# Patient Record
Sex: Male | Born: 1950
Health system: Southern US, Community
[De-identification: ages and names within clinical notes are randomized; demographics above are authoritative.]

## PROBLEM LIST (undated history)

## (undated) DIAGNOSIS — K5732 Diverticulitis of large intestine without perforation or abscess without bleeding: Secondary | ICD-10-CM

## (undated) DIAGNOSIS — IMO0002 Reserved for concepts with insufficient information to code with codable children: Secondary | ICD-10-CM

## (undated) DIAGNOSIS — I10 Essential (primary) hypertension: Secondary | ICD-10-CM

## (undated) DIAGNOSIS — D126 Benign neoplasm of colon, unspecified: Secondary | ICD-10-CM

## (undated) DIAGNOSIS — E785 Hyperlipidemia, unspecified: Secondary | ICD-10-CM

## (undated) DIAGNOSIS — D649 Anemia, unspecified: Secondary | ICD-10-CM

## (undated) DIAGNOSIS — T7840XA Allergy, unspecified, initial encounter: Secondary | ICD-10-CM

## (undated) DIAGNOSIS — S22009A Unspecified fracture of unspecified thoracic vertebra, initial encounter for closed fracture: Secondary | ICD-10-CM

## (undated) DIAGNOSIS — K5730 Diverticulosis of large intestine without perforation or abscess without bleeding: Secondary | ICD-10-CM

## (undated) DIAGNOSIS — C4492 Squamous cell carcinoma of skin, unspecified: Secondary | ICD-10-CM

## (undated) DIAGNOSIS — K219 Gastro-esophageal reflux disease without esophagitis: Secondary | ICD-10-CM

## (undated) DIAGNOSIS — D508 Other iron deficiency anemias: Secondary | ICD-10-CM

## (undated) HISTORY — DX: Hyperlipidemia, unspecified: E78.5

## (undated) HISTORY — DX: Anemia, unspecified: D64.9

## (undated) HISTORY — DX: Unspecified fracture of unspecified thoracic vertebra, initial encounter for closed fracture: S22.009A

## (undated) HISTORY — DX: Allergy, unspecified, initial encounter: T78.40XA

## (undated) HISTORY — DX: Diverticulosis of large intestine without perforation or abscess without bleeding: K57.30

## (undated) HISTORY — DX: Diverticulitis of large intestine without perforation or abscess without bleeding: K57.32

## (undated) HISTORY — DX: Squamous cell carcinoma of skin, unspecified: C44.92

## (undated) HISTORY — DX: Reserved for concepts with insufficient information to code with codable children: IMO0002

## (undated) HISTORY — DX: Essential (primary) hypertension: I10

## (undated) HISTORY — PX: POLYPECTOMY: SHX149

## (undated) HISTORY — PX: WISDOM TOOTH EXTRACTION: SHX21

## (undated) HISTORY — PX: FOOT SURGERY: SHX648

## (undated) HISTORY — DX: Other iron deficiency anemias: D50.8

## (undated) HISTORY — DX: Benign neoplasm of colon, unspecified: D12.6

## (undated) HISTORY — PX: COLONOSCOPY W/ POLYPECTOMY: SHX1380

## (undated) HISTORY — DX: Gastro-esophageal reflux disease without esophagitis: K21.9

---

## 1990-10-21 DIAGNOSIS — IMO0002 Reserved for concepts with insufficient information to code with codable children: Secondary | ICD-10-CM

## 1990-10-21 HISTORY — DX: Reserved for concepts with insufficient information to code with codable children: IMO0002

## 1999-09-30 ENCOUNTER — Emergency Department (HOSPITAL_COMMUNITY): Admission: EM | Admit: 1999-09-30 | Discharge: 1999-09-30 | Payer: Self-pay | Admitting: Emergency Medicine

## 1999-09-30 ENCOUNTER — Encounter: Payer: Self-pay | Admitting: Emergency Medicine

## 2002-05-10 ENCOUNTER — Encounter: Admission: RE | Admit: 2002-05-10 | Discharge: 2002-05-10 | Payer: Self-pay | Admitting: *Deleted

## 2002-05-10 ENCOUNTER — Encounter: Payer: Self-pay | Admitting: *Deleted

## 2002-06-09 ENCOUNTER — Encounter: Admission: RE | Admit: 2002-06-09 | Discharge: 2002-06-09 | Payer: Self-pay | Admitting: *Deleted

## 2002-06-09 ENCOUNTER — Encounter: Payer: Self-pay | Admitting: *Deleted

## 2004-08-23 ENCOUNTER — Ambulatory Visit: Payer: Self-pay | Admitting: Internal Medicine

## 2004-11-14 ENCOUNTER — Encounter: Admission: RE | Admit: 2004-11-14 | Discharge: 2004-11-14 | Payer: Self-pay | Admitting: Orthopaedic Surgery

## 2004-11-28 ENCOUNTER — Encounter: Admission: RE | Admit: 2004-11-28 | Discharge: 2004-11-28 | Payer: Self-pay | Admitting: Orthopaedic Surgery

## 2005-02-18 ENCOUNTER — Ambulatory Visit: Payer: Self-pay | Admitting: Internal Medicine

## 2005-04-15 ENCOUNTER — Ambulatory Visit: Payer: Self-pay | Admitting: Internal Medicine

## 2005-10-16 ENCOUNTER — Ambulatory Visit: Payer: Self-pay | Admitting: Internal Medicine

## 2006-03-27 ENCOUNTER — Ambulatory Visit: Payer: Self-pay | Admitting: Internal Medicine

## 2006-03-28 ENCOUNTER — Encounter: Admission: RE | Admit: 2006-03-28 | Discharge: 2006-03-28 | Payer: Self-pay | Admitting: Internal Medicine

## 2006-03-29 ENCOUNTER — Encounter: Admission: RE | Admit: 2006-03-29 | Discharge: 2006-03-29 | Payer: Self-pay | Admitting: Internal Medicine

## 2006-04-08 ENCOUNTER — Ambulatory Visit: Payer: Self-pay | Admitting: Internal Medicine

## 2006-05-20 ENCOUNTER — Ambulatory Visit: Payer: Self-pay | Admitting: Internal Medicine

## 2006-06-19 ENCOUNTER — Ambulatory Visit: Payer: Self-pay | Admitting: Gastroenterology

## 2006-06-21 DIAGNOSIS — D126 Benign neoplasm of colon, unspecified: Secondary | ICD-10-CM

## 2006-06-21 HISTORY — DX: Benign neoplasm of colon, unspecified: D12.6

## 2006-06-27 ENCOUNTER — Ambulatory Visit: Payer: Self-pay | Admitting: Gastroenterology

## 2006-06-27 ENCOUNTER — Encounter: Payer: Self-pay | Admitting: Gastroenterology

## 2006-06-27 ENCOUNTER — Encounter: Payer: Self-pay | Admitting: Internal Medicine

## 2006-07-14 ENCOUNTER — Ambulatory Visit: Payer: Self-pay | Admitting: Internal Medicine

## 2006-09-04 ENCOUNTER — Ambulatory Visit: Payer: Self-pay | Admitting: Internal Medicine

## 2006-11-04 ENCOUNTER — Ambulatory Visit: Payer: Self-pay | Admitting: Internal Medicine

## 2006-11-04 LAB — CONVERTED CEMR LAB
Basophils Absolute: 0 10*3/uL (ref 0.0–0.1)
Basophils Relative: 0.8 % (ref 0.0–1.0)
HCT: 42.8 % (ref 39.0–52.0)
Iron: 77 ug/dL (ref 42–165)
Monocytes Relative: 11.2 % — ABNORMAL HIGH (ref 3.0–11.0)
Neutro Abs: 2.3 10*3/uL (ref 1.4–7.7)
Neutrophils Relative %: 39.9 % — ABNORMAL LOW (ref 43.0–77.0)

## 2006-11-10 ENCOUNTER — Ambulatory Visit: Payer: Self-pay | Admitting: Internal Medicine

## 2006-11-10 LAB — CONVERTED CEMR LAB
Albumin: 4.3 g/dL (ref 3.5–5.2)
Basophils Relative: 1.3 % — ABNORMAL HIGH (ref 0.0–1.0)
Bilirubin, Direct: 0.2 mg/dL (ref 0.0–0.3)
Cholesterol: 122 mg/dL (ref 0–200)
Eosinophils Relative: 3.6 % (ref 0.0–5.0)
HCT: 43.8 % (ref 39.0–52.0)
Hemoglobin: 15.3 g/dL (ref 13.0–17.0)
LDL Cholesterol: 69 mg/dL (ref 0–99)
Lymphocytes Relative: 42.2 % (ref 12.0–46.0)
MCHC: 34.9 g/dL (ref 30.0–36.0)
MCV: 82.5 fL (ref 78.0–100.0)
Monocytes Relative: 9.1 % (ref 3.0–11.0)
Neutro Abs: 2.6 10*3/uL (ref 1.4–7.7)
Neutrophils Relative %: 43.8 % (ref 43.0–77.0)
RBC: 5.31 M/uL (ref 4.22–5.81)
RDW: 13.3 % (ref 11.5–14.6)
Total CHOL/HDL Ratio: 3.9
Total Protein: 7.2 g/dL (ref 6.0–8.3)
Triglycerides: 111 mg/dL (ref 0–149)
VLDL: 22 mg/dL (ref 0–40)

## 2007-02-12 ENCOUNTER — Ambulatory Visit: Payer: Self-pay | Admitting: Internal Medicine

## 2007-02-12 LAB — CONVERTED CEMR LAB
Basophils Absolute: 0 10*3/uL (ref 0.0–0.1)
Basophils Relative: 0 % (ref 0.0–1.0)
Eosinophils Relative: 3.8 % (ref 0.0–5.0)
Lymphocytes Relative: 39.8 % (ref 12.0–46.0)
MCV: 86.2 fL (ref 78.0–100.0)
Monocytes Absolute: 0.7 10*3/uL (ref 0.2–0.7)
Neutro Abs: 3 10*3/uL (ref 1.4–7.7)
Platelets: 217 10*3/uL (ref 150–400)
RBC: 5.2 M/uL (ref 4.22–5.81)
RDW: 12.5 % (ref 11.5–14.6)

## 2007-05-15 DIAGNOSIS — K219 Gastro-esophageal reflux disease without esophagitis: Secondary | ICD-10-CM | POA: Insufficient documentation

## 2007-05-15 DIAGNOSIS — I10 Essential (primary) hypertension: Secondary | ICD-10-CM | POA: Insufficient documentation

## 2007-05-15 DIAGNOSIS — E785 Hyperlipidemia, unspecified: Secondary | ICD-10-CM | POA: Insufficient documentation

## 2007-05-28 ENCOUNTER — Ambulatory Visit: Payer: Self-pay | Admitting: Internal Medicine

## 2007-05-28 DIAGNOSIS — D508 Other iron deficiency anemias: Secondary | ICD-10-CM

## 2007-05-28 DIAGNOSIS — K5730 Diverticulosis of large intestine without perforation or abscess without bleeding: Secondary | ICD-10-CM | POA: Insufficient documentation

## 2007-05-28 DIAGNOSIS — K429 Umbilical hernia without obstruction or gangrene: Secondary | ICD-10-CM | POA: Insufficient documentation

## 2007-05-28 HISTORY — DX: Diverticulosis of large intestine without perforation or abscess without bleeding: K57.30

## 2007-05-28 HISTORY — DX: Other iron deficiency anemias: D50.8

## 2007-05-28 LAB — CONVERTED CEMR LAB
CO2: 29 meq/L (ref 19–32)
Calcium: 9.3 mg/dL (ref 8.4–10.5)
Chloride: 103 meq/L (ref 96–112)
Cholesterol: 134 mg/dL (ref 0–200)
Eosinophils Absolute: 0.2 10*3/uL (ref 0.0–0.6)
Eosinophils Relative: 4 % (ref 0.0–5.0)
GFR calc Af Amer: 89 mL/min
Glucose, Bld: 96 mg/dL (ref 70–99)
HCT: 42.4 % (ref 39.0–52.0)
HDL: 28.5 mg/dL — ABNORMAL LOW (ref >39.0)
Iron: 131 ug/dL (ref 42–165)
Lymphocytes Relative: 35.5 % (ref 12.0–46.0)
MCHC: 34.3 g/dL (ref 30.0–36.0)
Monocytes Relative: 9.6 % (ref 3.0–11.0)
Neutrophils Relative %: 49.9 % (ref 43.0–77.0)
RDW: 12.2 % (ref 11.5–14.6)
Sodium: 138 meq/L (ref 135–145)
Total Bilirubin: 1.9 mg/dL — ABNORMAL HIGH (ref 0.3–1.2)
Triglycerides: 120 mg/dL (ref 0–149)
VLDL: 24 mg/dL (ref 0–40)

## 2007-08-04 ENCOUNTER — Ambulatory Visit: Payer: Self-pay | Admitting: Internal Medicine

## 2007-11-30 ENCOUNTER — Ambulatory Visit: Payer: Self-pay | Admitting: Internal Medicine

## 2007-11-30 LAB — CONVERTED CEMR LAB
Cholesterol: 114 mg/dL (ref 0–200)
HDL: 27.4 mg/dL — ABNORMAL LOW (ref >39.0)
LDL Cholesterol: 73 mg/dL (ref 0–99)
Total CHOL/HDL Ratio: 4.2
VLDL: 14 mg/dL (ref 0–40)

## 2007-12-07 ENCOUNTER — Ambulatory Visit: Payer: Self-pay | Admitting: Internal Medicine

## 2007-12-07 DIAGNOSIS — Z86010 Personal history of colonic polyps: Secondary | ICD-10-CM | POA: Insufficient documentation

## 2007-12-07 DIAGNOSIS — F528 Other sexual dysfunction not due to a substance or known physiological condition: Secondary | ICD-10-CM | POA: Insufficient documentation

## 2007-12-07 LAB — CONVERTED CEMR LAB: LDL Goal: 100 mg/dL

## 2008-01-25 ENCOUNTER — Ambulatory Visit: Payer: Self-pay | Admitting: Internal Medicine

## 2008-01-25 DIAGNOSIS — M546 Pain in thoracic spine: Secondary | ICD-10-CM | POA: Insufficient documentation

## 2008-01-28 ENCOUNTER — Ambulatory Visit: Payer: Self-pay | Admitting: Internal Medicine

## 2008-03-09 ENCOUNTER — Ambulatory Visit: Payer: Self-pay | Admitting: Internal Medicine

## 2008-03-09 DIAGNOSIS — S22009A Unspecified fracture of unspecified thoracic vertebra, initial encounter for closed fracture: Secondary | ICD-10-CM | POA: Insufficient documentation

## 2008-03-09 HISTORY — DX: Unspecified fracture of unspecified thoracic vertebra, initial encounter for closed fracture: S22.009A

## 2008-03-09 LAB — CONVERTED CEMR LAB
Alpha-2-Globulin: 10.8 % (ref 7.1–11.8)
BUN: 18 mg/dL (ref 6–23)
Beta Globulin: 6.9 % (ref 4.7–7.2)
Calcium, Total (PTH): 9.3 mg/dL (ref 8.4–10.5)
Creatinine, Ser: 0.89 mg/dL (ref 0.40–1.50)
Glucose, Bld: 72 mg/dL (ref 70–99)
Potassium: 4.8 meq/L (ref 3.5–5.3)
Sodium: 140 meq/L (ref 135–145)
Total Protein, Serum Electrophoresis: 7.5 g/dL (ref 6.0–8.3)

## 2008-03-10 ENCOUNTER — Ambulatory Visit: Payer: Self-pay | Admitting: Internal Medicine

## 2008-03-10 ENCOUNTER — Encounter: Payer: Self-pay | Admitting: Internal Medicine

## 2008-06-10 ENCOUNTER — Ambulatory Visit: Payer: Self-pay | Admitting: Internal Medicine

## 2008-06-10 LAB — CONVERTED CEMR LAB
ALT: 31 units/L (ref 0–53)
AST: 25 units/L (ref 0–37)
Alkaline Phosphatase: 39 units/L (ref 39–117)
BUN: 14 mg/dL (ref 6–23)
Basophils Absolute: 0.1 10*3/uL (ref 0.0–0.1)
Basophils Relative: 0.9 % (ref 0.0–3.0)
Chloride: 105 meq/L (ref 96–112)
Cholesterol: 142 mg/dL (ref 0–200)
Eosinophils Absolute: 0.3 10*3/uL (ref 0.0–0.7)
Eosinophils Relative: 5.3 % — ABNORMAL HIGH (ref 0.0–5.0)
GFR calc Af Amer: 89 mL/min
GFR calc non Af Amer: 73 mL/min
Glucose, Bld: 89 mg/dL (ref 70–99)
Glucose, Urine, Semiquant: NEGATIVE
Hemoglobin: 15.1 g/dL (ref 13.0–17.0)
Neutro Abs: 3.2 10*3/uL (ref 1.4–7.7)
Neutrophils Relative %: 53.1 % (ref 43.0–77.0)
Platelets: 199 10*3/uL (ref 150–400)
Potassium: 4.6 meq/L (ref 3.5–5.1)
Sodium: 143 meq/L (ref 135–145)
Specific Gravity, Urine: 1.02
TSH: 2.3 microintl units/mL (ref 0.35–5.50)
Total Bilirubin: 1.3 mg/dL — ABNORMAL HIGH (ref 0.3–1.2)
Triglycerides: 96 mg/dL (ref 0–149)
WBC Urine, dipstick: NEGATIVE

## 2008-06-13 LAB — CONVERTED CEMR LAB
Basophils Absolute: 0 10*3/uL (ref 0.0–0.1)
Basophils Relative: 0.6 % (ref 0.0–1.0)
Eosinophils Absolute: 0.3 10*3/uL (ref 0.0–0.7)
HCT: 44.3 % (ref 39.0–52.0)
Hemoglobin: 14.9 g/dL (ref 13.0–17.0)
MCHC: 33.7 g/dL (ref 30.0–36.0)
MCV: 86.7 fL (ref 78.0–100.0)
Monocytes Relative: 8.9 % (ref 3.0–12.0)
Neutro Abs: 3.2 10*3/uL (ref 1.4–7.7)
Neutrophils Relative %: 50.8 % (ref 43.0–77.0)
RDW: 12.7 % (ref 11.5–14.6)

## 2008-06-17 ENCOUNTER — Ambulatory Visit: Payer: Self-pay | Admitting: Internal Medicine

## 2008-06-17 DIAGNOSIS — J309 Allergic rhinitis, unspecified: Secondary | ICD-10-CM | POA: Insufficient documentation

## 2008-07-21 ENCOUNTER — Ambulatory Visit: Payer: Self-pay | Admitting: Internal Medicine

## 2008-12-09 ENCOUNTER — Ambulatory Visit: Payer: Self-pay | Admitting: Internal Medicine

## 2008-12-09 LAB — CONVERTED CEMR LAB
ALT: 36 units/L (ref 0–53)
AST: 29 units/L (ref 0–37)
Albumin: 4.2 g/dL (ref 3.5–5.2)
Alkaline Phosphatase: 39 units/L (ref 39–117)
BUN: 21 mg/dL (ref 6–23)
Cholesterol: 137 mg/dL (ref 0–200)
Creatinine, Ser: 1.3 mg/dL (ref 0.4–1.5)
Glucose, Bld: 94 mg/dL (ref 70–99)
VLDL: 23 mg/dL (ref 0–40)

## 2008-12-26 ENCOUNTER — Ambulatory Visit: Payer: Self-pay | Admitting: Internal Medicine

## 2009-06-01 ENCOUNTER — Ambulatory Visit: Payer: Self-pay | Admitting: Internal Medicine

## 2009-06-01 LAB — CONVERTED CEMR LAB
ALT: 55 units/L — ABNORMAL HIGH (ref 0–53)
Alkaline Phosphatase: 30 units/L — ABNORMAL LOW (ref 39–117)
Bilirubin, Direct: 0.1 mg/dL (ref 0.0–0.3)
HDL: 32.4 mg/dL — ABNORMAL LOW (ref >39.00)
LDL Cholesterol: 84 mg/dL (ref 0–99)
Total Bilirubin: 1.4 mg/dL — ABNORMAL HIGH (ref 0.3–1.2)

## 2009-06-14 ENCOUNTER — Ambulatory Visit: Payer: Self-pay | Admitting: Internal Medicine

## 2009-07-18 ENCOUNTER — Ambulatory Visit: Payer: Self-pay | Admitting: Internal Medicine

## 2009-12-04 ENCOUNTER — Ambulatory Visit: Payer: Self-pay | Admitting: Internal Medicine

## 2009-12-04 LAB — CONVERTED CEMR LAB
ALT: 63 units/L — ABNORMAL HIGH (ref 0–53)
AST: 45 units/L — ABNORMAL HIGH (ref 0–37)
Alkaline Phosphatase: 37 units/L — ABNORMAL LOW (ref 39–117)
Basophils Relative: 0.6 % (ref 0.0–3.0)
Bilirubin Urine: NEGATIVE
Bilirubin, Direct: 0.1 mg/dL (ref 0.0–0.3)
Blood in Urine, dipstick: NEGATIVE
Chloride: 107 meq/L (ref 96–112)
Cholesterol: 171 mg/dL (ref 0–200)
Creatinine, Ser: 1.4 mg/dL (ref 0.4–1.5)
Eosinophils Absolute: 0.2 10*3/uL (ref 0.0–0.7)
GFR calc non Af Amer: 55.07 mL/min (ref >60)
Glucose, Bld: 102 mg/dL — ABNORMAL HIGH (ref 70–99)
Glucose, Urine, Semiquant: NEGATIVE
HDL: 41.4 mg/dL (ref >39.00)
LDL Cholesterol: 96 mg/dL (ref 0–99)
MCHC: 33.3 g/dL (ref 30.0–36.0)
Monocytes Absolute: 0.6 10*3/uL (ref 0.1–1.0)
Monocytes Relative: 8.2 % (ref 3.0–12.0)
Neutro Abs: 4.2 10*3/uL (ref 1.4–7.7)
Nitrite: NEGATIVE
PSA: 1.74 ng/mL (ref 0.10–4.00)
Sodium: 144 meq/L (ref 135–145)
Specific Gravity, Urine: 1.02
TSH: 1.77 microintl units/mL (ref 0.35–5.50)
Total Protein: 7.5 g/dL (ref 6.0–8.3)
Urobilinogen, UA: 1

## 2009-12-11 ENCOUNTER — Ambulatory Visit: Payer: Self-pay | Admitting: Internal Medicine

## 2010-03-15 ENCOUNTER — Ambulatory Visit: Payer: Self-pay | Admitting: Internal Medicine

## 2010-03-15 DIAGNOSIS — E162 Hypoglycemia, unspecified: Secondary | ICD-10-CM | POA: Insufficient documentation

## 2010-03-15 LAB — CONVERTED CEMR LAB
BUN: 22 mg/dL (ref 6–23)
CO2: 28 meq/L (ref 19–32)
Calcium: 9.9 mg/dL (ref 8.4–10.5)
Creatinine, Ser: 1.2 mg/dL (ref 0.4–1.5)
GFR calc non Af Amer: 69.04 mL/min (ref >60)
Hgb A1c MFr Bld: 5.4 % (ref 4.6–6.5)
Sodium: 140 meq/L (ref 135–145)

## 2010-03-27 ENCOUNTER — Telehealth: Payer: Self-pay | Admitting: Internal Medicine

## 2010-07-16 ENCOUNTER — Ambulatory Visit: Payer: Self-pay | Admitting: Internal Medicine

## 2010-11-22 NOTE — Assessment & Plan Note (Signed)
Summary: CPX//SLM   Vital Signs:  Patient profile:   60 year old male Height:      61 inches Weight:      225 pounds BMI:     42.67 Temp:     98.2 degrees F oral Pulse rate:   64 / minute Resp:     14 per minute BP sitting:   140 / 84  (left arm)  Vitals Entered By: Willy Eddy, LPN (December 11, 2009 8:59 AM) CC: cpx Is Patient Diabetic? No   CC:  cpx.  History of Present Illness: The pt was asked about all immunizations, health maint. services that are appropriate to their age and was given guidance on diet exercize  and weight management Went ot the [place] and they have suggested changes in his medications which are to low to change safely we discssued appropriate switches and will send him back to the [place]   Preventive Screening-Counseling & Management  Alcohol-Tobacco     Smoking Status: quit  Problems Prior to Update: 1)  Allergic Rhinitis  (ICD-477.9) 2)  Compression Fracture, Thoracic Vertebra  (ICD-805.2) 3)  Back Pain, Thoracic Region, Left  (ICD-724.1) 4)  Erectile Dysfunction  (ICD-302.72) 5)  Colonic Polyps, Hx of  (ICD-V12.72) 6)  Diverticulosis, Colon  (ICD-562.10) 7)  Preventive Health Care  (ICD-V70.0) 8)  Umbilical Hernia, Hx of  (ICD-V13.8) 9)  Family History Breast Cancer 1st Degree Relative <50  (ICD-V16.3) 10)  Anemia Due To Dietary Iron Deficiency  (ICD-280.1) 11)  Hypertension  (ICD-401.9) 12)  Hyperlipidemia  (ICD-272.4) 13)  Gerd  (ICD-530.81)  Current Problems (verified): 1)  Allergic Rhinitis  (ICD-477.9) 2)  Compression Fracture, Thoracic Vertebra  (ICD-805.2) 3)  Back Pain, Thoracic Region, Left  (ICD-724.1) 4)  Erectile Dysfunction  (ICD-302.72) 5)  Colonic Polyps, Hx of  (ICD-V12.72) 6)  Diverticulosis, Colon  (ICD-562.10) 7)  Preventive Health Care  (ICD-V70.0) 8)  Umbilical Hernia, Hx of  (ICD-V13.8) 9)  Family History Breast Cancer 1st Degree Relative <50  (ICD-V16.3) 10)  Anemia Due To Dietary Iron Deficiency   (ICD-280.1) 11)  Hypertension  (ICD-401.9) 12)  Hyperlipidemia  (ICD-272.4) 13)  Gerd  (ICD-530.81)  Medications Prior to Update: 1)  Lipitor 10 Mg Tabs (Atorvastatin Calcium) .... One By Mouth Daily 2)  Benicar 40 Mg Tabs (Olmesartan Medoxomil) .... One By Mouth Daily 3)  Omeprazole 40 Mg Cpdr (Omeprazole) .... One By Mouth Daily 4)  Transderm-Scop 1.5 Mg Pt72 (Scopolamine Base) .... As Needed 5)  Multivitamins   Caps (Multiple Vitamin) .... Once Daily 6)  Cialis 5 Mg Tabs (Tadalafil) .... One By Mouth Daily 7)  Fish Oil Concentrate 1000 Mg Caps (Omega-3 Fatty Acids) .... Two Caps By Mouth Two Times A Day 8)  Folic Acid 400 Mcg Tabs (Folic Acid) .[name].. 1 Once Daily  Current Medications (verified): 1)  Pravastatin Sodium 40 Mg Tabs (Pravastatin Sodium) .... One By Mouth Daily 2)  Lisinopril 40 Mg Tabs (Lisinopril) .... One By Mouth Daily 3)  Omeprazole 40 Mg Cpdr (Omeprazole) .... One By Mouth Daily 4)  Multivitamins   Caps (Multiple Vitamin) .... Once Daily 5)  Cialis 5 Mg Tabs (Tadalafil) .... One By Mouth Daily 6)  Fish Oil Concentrate 1000 Mg Caps (Omega-3 Fatty Acids) .... Two Caps By Mouth Two Times A Day 7)  Folic Acid 400 Mcg Tabs (Folic Acid) .[name].. 1 Once Daily  Allergies (verified): No Known Drug Allergies  Past History:  Family History: Last updated: 05/28/2007 brother with hemochomatosis  Family History Lung cancer  father Mother Family History Breast cancer 1st degree relative <50  Social History: Last updated: 05/28/2007 Married Former Smoker Regular exercise-yes  Risk Factors: Exercise: yes (05/28/2007)  Risk Factors: Smoking Status: quit (12/11/2009)  Past medical, surgical, family and social histories (including risk factors) reviewed, and no changes noted (except as noted below).  Past Medical History: Reviewed history from 06/17/2008 and no changes required. GERD Hyperlipidemia Hypertension anemia Diverticulosis, colon Colonic polyps, hx  of Allergic rhinitis  Past Surgical History: Reviewed history from 12/07/2007 and no changes required. foot surgery Colon polypectomy  Family History: Reviewed history from 05/28/2007 and no changes required. brother with hemochomatosis Family History Lung cancer  father Mother Family History Breast cancer 1st degree relative <50  Social History: Reviewed history from 05/28/2007 and no changes required. Married Former Smoker Regular exercise-yes  Physical Exam  General:  Well-developed,well-nourished,in no acute distress; alert,appropriate and cooperative throughout examination Head:  normocephalic and atraumatic.   Eyes:  pupils equal, pupils round, and pupils reactive to light.   Ears:  R ear normal and L ear normal.   Nose:  no nasal discharge.   Mouth:  Oral mucosa and oropharynx without lesions or exudates.  Teeth in good repair. Neck:  No deformities, masses, or tenderness noted. Chest Wall:  no deformities and no tenderness.   Lungs:  Normal respiratory effort, chest expands symmetrically. Lungs are clear to auscultation, no crackles or wheezes. Heart:  Normal rate and regular rhythm. S1 and S2 normal without gallop, murmur, click, rub or other extra sounds. Abdomen:  soft, non-tender, and no distention.   Rectal:  normal sphincter tone and external hemorrhoid(s).   Genitalia:  circumcised and no urethral discharge.   Prostate:  no gland enlargement and no nodules.   Msk:  No deformity or scoliosis noted of thoracic or lumbar spine.   Pulses:  R and L carotid,radial,femoral,dorsalis pedis and posterior tibial pulses are full and equal bilaterally Extremities:  No clubbing, cyanosis, edema, or deformity noted with normal full range of motion of all joints.   Neurologic:  No cranial nerve deficits noted. Station and gait are normal. Plantar reflexes are down-going bilaterally. DTRs are symmetrical throughout. Sensory, motor and coordinative functions appear  intact.   Impression & Recommendations:  Problem # 1:  PREVENTIVE HEALTH CARE (ICD-V70.0) The pt was asked about all immunizations, health maint. services that are appropriate to their age and was given guidance on diet exercize  and weight management  Colonoscopy: Results: Polyp.  Results: Diverticulosis.        (06/27/2006) Td Booster: Tdap (12/11/2009)   Flu Vax: Fluvax 3+ (07/18/2009)   Chol: 171 (12/04/2009)   HDL: 41.40 (12/04/2009)   LDL: 96 (12/04/2009)   TG: 170.0 (12/04/2009) TSH: 1.77 (12/04/2009)   PSA: 1.74 (12/04/2009) Next Colonoscopy due:: 06/2011 (12/07/2007)  Discussed using sunscreen, use of alcohol, drug use, self testicular exam, routine dental care, routine eye care, routine physical exam, seat belts, multiple vitamins, osteoporosis prevention, adequate calcium intake in diet, and recommendations for immunizations.  Discussed exercise and checking cholesterol.  Discussed gun safety, safe sex, and contraception. Also recommend checking PSA.  Problem # 2:  HYPERLIPIDEMIA (ICD-272.4) med changes for [facility] His updated medication list for this problem includes:    Pravastatin Sodium 40 Mg Tabs (Pravastatin sodium) ..... One by mouth daily  Complete Medication List: 1)  Pravastatin Sodium 40 Mg Tabs (Pravastatin sodium) .... One by mouth daily 2)  Lisinopril 40 Mg Tabs (Lisinopril) .Marland KitchenMarland KitchenMarland Kitchen  One by mouth daily 3)  Omeprazole 40 Mg Cpdr (Omeprazole) .... One by mouth daily 4)  Multivitamins Caps (Multiple vitamin) .... Once daily 5)  Cialis 5 Mg Tabs (Tadalafil) .... One by mouth daily 6)  Fish Oil Concentrate 1000 Mg Caps (Omega-3 fatty acids) .... Two caps by mouth two times a day 7)  Folic Acid 400 Mcg Tabs (Folic acid) .[name].. 1 once daily  Other Orders: Tdap => 89yrs IM (04540) Admin 1st Vaccine (98119) EKG w/ Interpretation (93000)  Patient Instructions: 1)  When you go back to the [place] the amoiunt of lisinopril was insuficinet and you need 40 mg  ( to egual to  Benicar) 2)  you have reguired 40 mg of prilosec to control your GERD 3)  the dose of pravastatin equal in effectivelness to lipitor is 40 mg 4)  if they are will to make these changes then we can moniter the effect in 3 months 5)  Please schedule a follow-up appointment in 3 months 6)  BMP prior to visit, ICD-9: 401.9 7)  Hepatic Panel prior to visit, ICD-9:995.20 8)  Lipid Panel prior to visit, ICD-9:272.4   Immunizations Administered:  Tetanus Vaccine:    Vaccine Type: Tdap    Site: right deltoid    Mfr: boostrix    Dose: 0.5 ml    Route: IM    Given by: Willy Eddy, LPN    Exp. Date: 12/16/2011    Lot #: [id number]

## 2010-11-22 NOTE — Progress Notes (Signed)
Summary: please advise  Phone Note Call from Patient Call back at Home Phone [phone] Call back at Work Phone [phone]   Caller: Spouse----live call Summary of Call: was recently put on Lisinopril 40mg  1qd. having chest tightness and difficulty breathing. Please advise. pt did take his pill today. Initial call taken by: Warnell Forester,  March 27, 2010 12:18 PM  Follow-up for Phone Call        Wants to change back to Benicar. [facility]  [place] Follow-up by: Lynann Beaver CMA,  March 27, 2010 12:54 PM  Additional Follow-up for Phone Call Additional follow up Details #1::        per dr Lovell Sheehan- may change back to benicar 40 1 once daily but we cant guarantee insurance will pay Additional Follow-up by: Willy Eddy, LPN,  March 28, 2955 1:48 PM   New Allergies: ! ACE INHIBITORS New/Updated Medications: BENICAR 40 MG TABS (OLMESARTAN MEDOXOMIL) one by mouth daily New Allergies: ! ACE INHIBITORSPrescriptions: BENICAR 40 MG TABS (OLMESARTAN MEDOXOMIL) one by mouth daily  #90 x 1   Entered by:   Lynann Beaver CMA   Authorized by:   Stacie Glaze MD   Signed by:   Lynann Beaver CMA on 03/27/2010   Method used:   Electronically to        [facility]. [phone]* (retail)       [address]       [place], [place]  [address]       Ph: [phone]       Fax: [phone]   RxID:   [id number]

## 2010-11-22 NOTE — Assessment & Plan Note (Signed)
Summary: 3 MONTH ROV/NJR   Vital Signs:  Patient profile:   60 year old male Height:      61 inches Weight:      228 pounds BMI:     43.24 Temp:     98.2 degrees F oral Pulse rate:   64 / minute Pulse rhythm:   regular Resp:     14 per minute BP sitting:   140 / 84  (left arm)  Vitals Entered By: Willy Eddy, LPN (Mar 15, 2010 9:23 AM) CC: roa, Hypertension Management   CC:  roa and Hypertension Management.  History of Present Illness: The pt was changes from benicar to lisenotrip and notes some hacking cough The pt has increased sinus congestion and allergy symptoms using allegra and has dizzyness stopped and this went away has been feeling "run down"   Hypertension History:      He denies headache, chest pain, palpitations, dyspnea with exertion, orthopnea, PND, peripheral edema, visual symptoms, neurologic problems, syncope, and side effects from treatment.        Positive major cardiovascular risk factors include male age 74 years old or older, hyperlipidemia, and hypertension.  Negative major cardiovascular risk factors include non-tobacco-user status.        Positive history for target organ damage include ASHD (either angina/prior MI/prior CABG).     Preventive Screening-Counseling & Management  Alcohol-Tobacco     Smoking Status: quit  Problems Prior to Update: 1)  Allergic Rhinitis  (ICD-477.9) 2)  Compression Fracture, Thoracic Vertebra  (ICD-805.2) 3)  Back Pain, Thoracic Region, Left  (ICD-724.1) 4)  Erectile Dysfunction  (ICD-302.72) 5)  Colonic Polyps, Hx of  (ICD-V12.72) 6)  Diverticulosis, Colon  (ICD-562.10) 7)  Preventive Health Care  (ICD-V70.0) 8)  Umbilical Hernia, Hx of  (ICD-V13.8) 9)  Family History Breast Cancer 1st Degree Relative <50  (ICD-V16.3) 10)  Anemia Due To Dietary Iron Deficiency  (ICD-280.1) 11)  Hypertension  (ICD-401.9) 12)  Hyperlipidemia  (ICD-272.4) 13)  Gerd  (ICD-530.81)  Current Problems (verified): 1)   Allergic Rhinitis  (ICD-477.9) 2)  Compression Fracture, Thoracic Vertebra  (ICD-805.2) 3)  Back Pain, Thoracic Region, Left  (ICD-724.1) 4)  Erectile Dysfunction  (ICD-302.72) 5)  Colonic Polyps, Hx of  (ICD-V12.72) 6)  Diverticulosis, Colon  (ICD-562.10) 7)  Preventive Health Care  (ICD-V70.0) 8)  Umbilical Hernia, Hx of  (ICD-V13.8) 9)  Family History Breast Cancer 1st Degree Relative <50  (ICD-V16.3) 10)  Anemia Due To Dietary Iron Deficiency  (ICD-280.1) 11)  Hypertension  (ICD-401.9) 12)  Hyperlipidemia  (ICD-272.4) 13)  Gerd  (ICD-530.81)  Medications Prior to Update: 1)  Pravastatin Sodium 40 Mg Tabs (Pravastatin Sodium) .... One By Mouth Daily 2)  Lisinopril 40 Mg Tabs (Lisinopril) .... One By Mouth Daily 3)  Omeprazole 40 Mg Cpdr (Omeprazole) .... One By Mouth Daily 4)  Multivitamins   Caps (Multiple Vitamin) .... Once Daily 5)  Cialis 5 Mg Tabs (Tadalafil) .... One By Mouth Daily 6)  Fish Oil Concentrate 1000 Mg Caps (Omega-3 Fatty Acids) .... Two Caps By Mouth Two Times A Day 7)  Folic Acid 400 Mcg Tabs (Folic Acid) .[name].. 1 Once Daily  Current Medications (verified): 1)  Lipitor 20 Mg Tabs (Atorvastatin Calcium) .[name].. 1 Once Daily 2)  Lisinopril 40 Mg Tabs (Lisinopril) .... One By Mouth Daily 3)  Omeprazole 40 Mg Cpdr (Omeprazole) .... One By Mouth Daily 4)  Multivitamins   Caps (Multiple Vitamin) .... Once Daily 5)  Cialis 5 Mg  Tabs (Tadalafil) .... One By Mouth Daily 6)  Fish Oil Concentrate 1000 Mg Caps (Omega-3 Fatty Acids) .... Two Caps By Mouth Two Times A Day 7)  Folic Acid 400 Mcg Tabs (Folic Acid) .[name].. 1 Once Daily  Allergies (verified): No Known Drug Allergies  Past History:  Family History: Last updated: 05/28/2007 brother with hemochomatosis Family History Lung cancer  father Mother Family History Breast cancer 1st degree relative <50  Social History: Last updated: 05/28/2007 Married Former Smoker Regular exercise-yes  Risk Factors: Exercise:  yes (05/28/2007)  Risk Factors: Smoking Status: quit (03/15/2010)  Past medical, surgical, family and social histories (including risk factors) reviewed, and no changes noted (except as noted below).  Past Medical History: Reviewed history from 06/17/2008 and no changes required. GERD Hyperlipidemia Hypertension anemia Diverticulosis, colon Colonic polyps, hx of Allergic rhinitis  Past Surgical History: Reviewed history from 12/07/2007 and no changes required. foot surgery Colon polypectomy  Family History: Reviewed history from 05/28/2007 and no changes required. brother with hemochomatosis Family History Lung cancer  father Mother Family History Breast cancer 1st degree relative <50  Social History: Reviewed history from 05/28/2007 and no changes required. Married Former Smoker Regular exercise-yes  Review of Systems  The patient denies anorexia, fever, weight loss, weight gain, vision loss, decreased hearing, hoarseness, chest pain, syncope, dyspnea on exertion, peripheral edema, prolonged cough, headaches, hemoptysis, abdominal pain, melena, hematochezia, severe indigestion/heartburn, hematuria, incontinence, genital sores, muscle weakness, suspicious skin lesions, transient blindness, difficulty walking, depression, unusual weight change, abnormal bleeding, enlarged lymph nodes, angioedema, and breast masses.    Physical Exam  General:  Well-developed,well-nourished,in no acute distress; alert,appropriate and cooperative throughout examination Head:  normocephalic and atraumatic.   Eyes:  pupils equal, pupils round, and pupils reactive to light.   Ears:  R ear normal and L ear normal.   Nose:  no nasal discharge.   Mouth:  Oral mucosa and oropharynx without lesions or exudates.  Teeth in good repair. Neck:  No deformities, masses, or tenderness noted. Lungs:  Normal respiratory effort, chest expands symmetrically. Lungs are clear to auscultation, no crackles or  wheezes. Heart:  Normal rate and regular rhythm. S1 and S2 normal without gallop, murmur, click, rub or other extra sounds. Abdomen:  soft, non-tender, and no distention.   Msk:  No deformity or scoliosis noted of thoracic or lumbar spine.   Pulses:  R and L carotid,radial,femoral,dorsalis pedis and posterior tibial pulses are full and equal bilaterally Extremities:  No clubbing, cyanosis, edema, or deformity noted with normal full range of motion of all joints.   Neurologic:  No cranial nerve deficits noted. Station and gait are normal. Plantar reflexes are down-going bilaterally. DTRs are symmetrical throughout. Sensory, motor and coordinative functions appear intact.   Impression & Recommendations:  Problem # 1:  HYPERLIPIDEMIA (ICD-272.4)  His updated medication list for this problem includes:    Lipitor 20 Mg Tabs (Atorvastatin calcium) .[name]... 1 once daily  Labs Reviewed: SGOT: 45 (12/04/2009)   SGPT: 63 (12/04/2009)  Lipid Goals: Chol Goal: 200 (12/07/2007)   HDL Goal: 40 (12/07/2007)   LDL Goal: 100 (12/07/2007)   TG Goal: 150 (12/07/2007)  Prior 10 Yr Risk Heart Disease: N/A (12/07/2007)   HDL:41.40 (12/04/2009), 32.40 (06/01/2009)  LDL:96 (12/04/2009), 84 (06/01/2009)  Chol:171 (12/04/2009), 152 (06/01/2009)  Trig:170.0 (12/04/2009), 180.0 (06/01/2009)  Problem # 2:  HYPERTENSION (ICD-401.9)  His updated medication list for this problem includes:    Lisinopril 40 Mg Tabs (Lisinopril) ..... One by mouth daily  BP today: 140/84 Prior BP: 140/84 (12/11/2009)  Prior 10 Yr Risk Heart Disease: N/A (12/07/2007)  Labs Reviewed: K+: 4.5 (12/04/2009) Creat: : 1.4 (12/04/2009)   Chol: 171 (12/04/2009)   HDL: 41.40 (12/04/2009)   LDL: 96 (12/04/2009)   TG: 170.0 (12/04/2009)  Problem # 3:  GERD (ICD-530.81)  His updated medication list for this problem includes:    Omeprazole 40 Mg Cpdr (Omeprazole) ..... One by mouth daily  Labs Reviewed: Hgb: 15.4 (12/04/2009)   Hct: 46.1  (12/04/2009)  Problem # 4:  REACTIVE HYPOGLYCEMIA (ICD-251.2)  wqeight loss and carbohydrate modificatons  Orders: Venipuncture (16109) TLB-BMP (Basic Metabolic Panel-BMET) (80048-METABOL) TLB-A1C / Hgb A1C (Glycohemoglobin) (83036-A1C)  Complete Medication List: 1)  Lipitor 20 Mg Tabs (Atorvastatin calcium) .[name].. 1 once daily 2)  Lisinopril 40 Mg Tabs (Lisinopril) .... One by mouth daily 3)  Omeprazole 40 Mg Cpdr (Omeprazole) .... One by mouth daily 4)  Multivitamins Caps (Multiple vitamin) .... Once daily 5)  Cialis 5 Mg Tabs (Tadalafil) .... One by mouth daily 6)  Fish Oil Concentrate 1000 Mg Caps (Omega-3 fatty acids) .... Two caps by mouth two times a day 7)  Folic Acid 400 Mcg Tabs (Folic acid) .[name].. 1 once daily  Hypertension Assessment/Plan:      The patient's hypertensive risk group is category C: Target organ damage and/or diabetes.  Today's blood pressure is 140/84.  His blood pressure goal is < 140/90.  Patient Instructions: 1)  to prevent hte low blood sugars modify diet to limit sugars and carbs    need to cut back on carb and increase protein 2)  wathc out for the sweet drinks with sugar 3)  Please schedule a follow-up appointment in 3 months.

## 2010-11-22 NOTE — Assessment & Plan Note (Signed)
Summary: ROA/FUP/RCD---PTS WIFE RSC (BMP) // RS wife rsc appt/njr   Vital Signs:  Patient profile:   60 year old male Height:      71 inches Weight:      220 pounds BMI:     30.79 Temp:     98.2 degrees F oral Pulse rate:   68 / minute Pulse rhythm:   regular Resp:     14 per minute BP sitting:   120 / 70  (left arm)  Vitals Entered By: Willy Eddy, LPN (July 16, 2010 2:12 PM)  Nutrition Counseling: Patient's BMI is greater than 25 and therefore counseled on weight management options. CC: roa, Hypertension Management, Lipid Management Is Patient Diabetic? No   Primary Care Provider:  Stacie Glaze MD  CC:  roa, Hypertension Management, and Lipid Management.  History of Present Illness: weigth loss of 10 lbs with  walking on a regular basis gerd improved with weight loss   Hypertension History:      He denies headache, chest pain, palpitations, dyspnea with exertion, orthopnea, PND, peripheral edema, visual symptoms, neurologic problems, syncope, and side effects from treatment.        Positive major cardiovascular risk factors include male age 33 years old or older, hyperlipidemia, and hypertension.  Negative major cardiovascular risk factors include non-tobacco-user status.        Positive history for target organ damage include ASHD (either angina/prior MI/prior CABG).    Lipid Management History:      Positive NCEP/ATP III risk factors include male age 39 years old or older, hypertension, and ASHD (either angina/prior MI/prior CABG).  Negative NCEP/ATP III risk factors include non-tobacco-user status.      Preventive Screening-Counseling & Management  Alcohol-Tobacco     Smoking Status: quit     Year Quit: 1982     Tobacco Counseling: to remain off tobacco products  Problems Prior to Update: 1)  Reactive Hypoglycemia  (ICD-251.2) 2)  Allergic Rhinitis  (ICD-477.9) 3)  Compression Fracture, Thoracic Vertebra  (ICD-805.2) 4)  Back Pain, Thoracic  Region, Left  (ICD-724.1) 5)  Erectile Dysfunction  (ICD-302.72) 6)  Colonic Polyps, Hx of  (ICD-V12.72) 7)  Diverticulosis, Colon  (ICD-562.10) 8)  Preventive Health Care  (ICD-V70.0) 9)  Umbilical Hernia, Hx of  (ICD-V13.8) 10)  Family History Breast Cancer 1st Degree Relative <50  (ICD-V16.3) 11)  Anemia Due To Dietary Iron Deficiency  (ICD-280.1) 12)  Hypertension  (ICD-401.9) 13)  Hyperlipidemia  (ICD-272.4) 14)  Gerd  (ICD-530.81)  Current Problems (verified): 1)  Reactive Hypoglycemia  (ICD-251.2) 2)  Allergic Rhinitis  (ICD-477.9) 3)  Compression Fracture, Thoracic Vertebra  (ICD-805.2) 4)  Back Pain, Thoracic Region, Left  (ICD-724.1) 5)  Erectile Dysfunction  (ICD-302.72) 6)  Colonic Polyps, Hx of  (ICD-V12.72) 7)  Diverticulosis, Colon  (ICD-562.10) 8)  Preventive Health Care  (ICD-V70.0) 9)  Umbilical Hernia, Hx of  (ICD-V13.8) 10)  Family History Breast Cancer 1st Degree Relative <50  (ICD-V16.3) 11)  Anemia Due To Dietary Iron Deficiency  (ICD-280.1) 12)  Hypertension  (ICD-401.9) 13)  Hyperlipidemia  (ICD-272.4) 14)  Gerd  (ICD-530.81)  Medications Prior to Update: 1)  Lipitor 20 Mg Tabs (Atorvastatin Calcium) .[name].. 1 Once Daily 2)  Omeprazole 40 Mg Cpdr (Omeprazole) .... One By Mouth Daily 3)  Multivitamins   Caps (Multiple Vitamin) .... Once Daily 4)  Cialis 5 Mg Tabs (Tadalafil) .... One By Mouth Daily 5)  Fish Oil Concentrate 1000 Mg Caps (Omega-3 Fatty Acids) .... Two  Caps By Mouth Two Times A Day 6)  Folic Acid 400 Mcg Tabs (Folic Acid) .[name].. 1 Once Daily 7)  Lodrane 24d 12-90 Mg Xr24h-Cap (Brompheniramine-Pseudoeph) .[name].. 1 Once Daily 8)  Benicar 40 Mg Tabs (Olmesartan Medoxomil) .... One By Mouth Daily 9)  Transderm-Scop 1.5 Mg Pt72 (Scopolamine Base) .[name].. 1 Box and Use  Asd Directed  Current Medications (verified): 1)  Lipitor 20 Mg Tabs (Atorvastatin Calcium) .[name].. 1 Once Daily 2)  Omeprazole 40 Mg Cpdr (Omeprazole) .... One By Mouth Daily 3)   Multivitamins   Caps (Multiple Vitamin) .... Once Daily 4)  Cialis 5 Mg Tabs (Tadalafil) .... One By Mouth Daily 5)  Fish Oil Concentrate 1000 Mg Caps (Omega-3 Fatty Acids) .... Two Caps By Mouth Two Times A Day 6)  Folic Acid 400 Mcg Tabs (Folic Acid) .[name].. 1 Once Daily 7)  Lodrane 24d 12-90 Mg Xr24h-Cap (Brompheniramine-Pseudoeph) .[name].. 1 Once Daily 8)  Benicar 40 Mg Tabs (Olmesartan Medoxomil) .... One By Mouth Daily 9)  Transderm-Scop 1.5 Mg Pt72 (Scopolamine Base) .[name].. 1 Box and Use  Asd Directed 10)  Aspirin 81 Mg Tbec (Aspirin) .[name].. 1 Once Daily  Allergies (verified): 1)  ! Ace Inhibitors  Past History:  Family History: Last updated: 05/28/2007 brother with hemochomatosis Family History Lung cancer  father Mother Family History Breast cancer 1st degree relative <50  Social History: Last updated: 05/28/2007 Married Former Smoker Regular exercise-yes  Risk Factors: Exercise: yes (05/28/2007)  Risk Factors: Smoking Status: quit (07/16/2010)  Past medical, surgical, family and social histories (including risk factors) reviewed, and no changes noted (except as noted below).  Past Medical History: Reviewed history from 06/17/2008 and no changes required. GERD Hyperlipidemia Hypertension anemia Diverticulosis, colon Colonic polyps, hx of Allergic rhinitis  Past Surgical History: Reviewed history from 12/07/2007 and no changes required. foot surgery Colon polypectomy  Family History: Reviewed history from 05/28/2007 and no changes required. brother with hemochomatosis Family History Lung cancer  father Mother Family History Breast cancer 1st degree relative <50  Social History: Reviewed history from 05/28/2007 and no changes required. Married Former Smoker Regular exercise-yes  Review of Systems       Flu Vaccine Consent Questions     Do you have a history of severe allergic reactions to this vaccine? no    Any prior history of allergic reactions to egg  and/or gelatin? no    Do you have a sensitivity to the preservative Thimersol? no    Do you have a past history of Guillan-Barre Syndrome? no    Do you currently have an acute febrile illness? no    Have you ever had a severe reaction to latex? no    Vaccine information given and explained to patient? yes    Are you currently pregnant? no    Lot Number:[id number]   Exp Date:04/20/2011   Site Given  Left Deltoid IM   Physical Exam  General:  Well-developed,well-nourished,in no acute distress; alert,appropriate and cooperative throughout examination Head:  normocephalic and atraumatic.   Eyes:  pupils equal, pupils round, and pupils reactive to light.   Ears:  R ear normal and L ear normal.   Nose:  no nasal discharge.   Mouth:  Oral mucosa and oropharynx without lesions or exudates.  Teeth in good repair. Neck:  No deformities, masses, or tenderness noted. Lungs:  Normal respiratory effort, chest expands symmetrically. Lungs are clear to auscultation, no crackles or wheezes. Heart:  Normal rate and regular rhythm. S1 and S2 normal  without gallop, murmur, click, rub or other extra sounds. Abdomen:  soft, non-tender, and no distention.   Msk:  No deformity or scoliosis noted of thoracic or lumbar spine.   Pulses:  R and L carotid,radial,femoral,dorsalis pedis and posterior tibial pulses are full and equal bilaterally Extremities:  No clubbing, cyanosis, edema, or deformity noted with normal full range of motion of all joints.   Neurologic:  No cranial nerve deficits noted. Station and gait are normal. Plantar reflexes are down-going bilaterally. DTRs are symmetrical throughout. Sensory, motor and coordinative functions appear intact.   Impression & Recommendations:  Problem # 1:  HYPERTENSION (ICD-401.9) Assessment Unchanged  His updated medication list for this problem includes:    Benicar 40 Mg Tabs (Olmesartan medoxomil) ..... One by mouth daily  BP today: 120/70 Prior BP:  140/84 (03/15/2010)  Prior 10 Yr Risk Heart Disease: N/A (12/07/2007)  Labs Reviewed: K+: 5.3 (03/15/2010) Creat: : 1.2 (03/15/2010)   Chol: 171 (12/04/2009)   HDL: 41.40 (12/04/2009)   LDL: 96 (12/04/2009)   TG: 170.0 (12/04/2009)  Problem # 2:  HYPERLIPIDEMIA (ICD-272.4) Assessment: Unchanged  His updated medication list for this problem includes:    Lipitor 20 Mg Tabs (Atorvastatin calcium) .[name]... 1 once daily  Labs Reviewed: SGOT: 45 (12/04/2009)   SGPT: 63 (12/04/2009)  Lipid Goals: Chol Goal: 200 (12/07/2007)   HDL Goal: 40 (12/07/2007)   LDL Goal: 100 (12/07/2007)   TG Goal: 150 (12/07/2007)  Prior 10 Yr Risk Heart Disease: N/A (12/07/2007)   HDL:41.40 (12/04/2009), 32.40 (06/01/2009)  LDL:96 (12/04/2009), 84 (06/01/2009)  Chol:171 (12/04/2009), 152 (06/01/2009)  Trig:170.0 (12/04/2009), 180.0 (06/01/2009)  Problem # 3:  GERD (ICD-530.81) Assessment: Unchanged  His updated medication list for this problem includes:    Omeprazole 40 Mg Cpdr (Omeprazole) ..... One by mouth daily  Labs Reviewed: Hgb: 15.4 (12/04/2009)   Hct: 46.1 (12/04/2009)  Complete Medication List: 1)  Lipitor 20 Mg Tabs (Atorvastatin calcium) .[name].. 1 once daily 2)  Omeprazole 40 Mg Cpdr (Omeprazole) .... One by mouth daily 3)  Multivitamins Caps (Multiple vitamin) .... Once daily 4)  Cialis 5 Mg Tabs (Tadalafil) .... One by mouth daily 5)  Fish Oil Concentrate 1000 Mg Caps (Omega-3 fatty acids) .... Two caps by mouth two times a day 6)  Folic Acid 400 Mcg Tabs (Folic acid) .[name].. 1 once daily 7)  Lodrane 24d 12-90 Mg Xr24h-cap (Brompheniramine-pseudoeph) .[name].. 1 once daily 8)  Benicar 40 Mg Tabs (Olmesartan medoxomil) .... One by mouth daily 9)  Transderm-scop 1.5 Mg Pt72 (Scopolamine base) .[name].. 1 box and use  asd directed 10)  Aspirin 81 Mg Tbec (Aspirin) .[name].. 1 once daily  Other Orders: Admin 1st Vaccine (46270) Flu Vaccine 54yrs + (35009)  Hypertension Assessment/Plan:      The patient's  hypertensive risk group is category C: Target organ damage and/or diabetes.  Today's blood pressure is 120/70.  His blood pressure goal is < 140/90.  Lipid Assessment/Plan:      Based on NCEP/ATP III, the patient's risk factor category is "history of coronary disease, peripheral vascular disease, cerebrovascular disease, or aortic aneurysm".  The patient's lipid goals are as follows: Total cholesterol goal is 200; LDL cholesterol goal is 100; HDL cholesterol goal is 40; Triglyceride goal is 150.  His LDL cholesterol goal has been met.    Patient Instructions: 1)  Please schedule a follow-up appointment in 6 months.CPX Prescriptions: LIPITOR 20 MG TABS (ATORVASTATIN CALCIUM) 1 once daily  #30 x 11   Entered and  Authorized by:   Stacie Glaze MD   Signed by:   Stacie Glaze MD on 07/16/2010   Method used:   Electronically to        [facility]. [phone]* (retail)       [phone]       [place], [place]  [address]       Ph: [phone]       Fax: [phone]   RxID:   [id number]   Appended Document: Orders Update     Clinical Lists Changes  Orders: Added new Service order of Est. Patient Level IV (57322) - Signed

## 2010-12-03 ENCOUNTER — Other Ambulatory Visit: Payer: Self-pay | Admitting: Dermatology

## 2010-12-19 ENCOUNTER — Telehealth: Payer: Self-pay | Admitting: Internal Medicine

## 2010-12-19 NOTE — Telephone Encounter (Signed)
Triage vm------had a fever, night before last. The fever broke this morning, but it is coming back with headches. Tried Advil and Tylenol for fever. Please advise.        [facility]

## 2010-12-19 NOTE — Telephone Encounter (Signed)
deb

## 2010-12-19 NOTE — Telephone Encounter (Signed)
Pt has fever, but does not know how high.  Complains of vomiting and headache.   Discussed with Dr. Rodena Medin and pt to be seen tomorrow.

## 2010-12-20 ENCOUNTER — Inpatient Hospital Stay (HOSPITAL_COMMUNITY)
Admission: EM | Admit: 2010-12-20 | Discharge: 2010-12-22 | DRG: 194 | Disposition: A | Discharge status: Home / Self Care | Payer: 59 | Attending: Internal Medicine | Admitting: Internal Medicine

## 2010-12-20 ENCOUNTER — Ambulatory Visit (INDEPENDENT_AMBULATORY_CARE_PROVIDER_SITE_OTHER): Payer: 59 | Admitting: Internal Medicine

## 2010-12-20 ENCOUNTER — Emergency Department (HOSPITAL_COMMUNITY): Payer: 59

## 2010-12-20 ENCOUNTER — Encounter: Payer: Self-pay | Admitting: Internal Medicine

## 2010-12-20 DIAGNOSIS — N529 Male erectile dysfunction, unspecified: Secondary | ICD-10-CM | POA: Diagnosis present

## 2010-12-20 DIAGNOSIS — R112 Nausea with vomiting, unspecified: Secondary | ICD-10-CM

## 2010-12-20 DIAGNOSIS — K219 Gastro-esophageal reflux disease without esophagitis: Secondary | ICD-10-CM | POA: Diagnosis present

## 2010-12-20 DIAGNOSIS — E785 Hyperlipidemia, unspecified: Secondary | ICD-10-CM | POA: Diagnosis present

## 2010-12-20 DIAGNOSIS — R509 Fever, unspecified: Secondary | ICD-10-CM

## 2010-12-20 DIAGNOSIS — D649 Anemia, unspecified: Secondary | ICD-10-CM | POA: Diagnosis present

## 2010-12-20 DIAGNOSIS — I10 Essential (primary) hypertension: Secondary | ICD-10-CM | POA: Diagnosis present

## 2010-12-20 DIAGNOSIS — J154 Pneumonia due to other streptococci: Principal | ICD-10-CM | POA: Diagnosis present

## 2010-12-20 DIAGNOSIS — E871 Hypo-osmolality and hyponatremia: Secondary | ICD-10-CM | POA: Diagnosis present

## 2010-12-20 DIAGNOSIS — D72829 Elevated white blood cell count, unspecified: Secondary | ICD-10-CM | POA: Diagnosis present

## 2010-12-20 LAB — CBC WITH DIFFERENTIAL/PLATELET
Basophils Absolute: 0 10*3/uL (ref 0.0–0.1)
Eosinophils Absolute: 0 10*3/uL (ref 0.0–0.7)
HCT: 37.3 % — ABNORMAL LOW (ref 39.0–52.0)
Hemoglobin: 12.8 g/dL — ABNORMAL LOW (ref 13.0–17.0)
Lymphocytes Relative: 4.6 % — ABNORMAL LOW (ref 12.0–46.0)
Monocytes Relative: 6.1 % (ref 3.0–12.0)
Platelets: 197 10*3/uL (ref 150.0–400.0)
RDW: 13.8 % (ref 11.5–14.6)

## 2010-12-20 LAB — BASIC METABOLIC PANEL
BUN: 24 mg/dL — ABNORMAL HIGH (ref 6–23)
CO2: 24 mEq/L (ref 19–32)
Calcium: 9.1 mg/dL (ref 8.4–10.5)
Creatinine, Ser: 1.2 mg/dL (ref 0.4–1.5)
GFR: 68.86 mL/min (ref >60.00)
Potassium: 4.4 mEq/L (ref 3.5–5.1)

## 2010-12-20 LAB — HEPATIC FUNCTION PANEL
ALT: 18 U/L (ref 0–53)
Albumin: 3.6 g/dL (ref 3.5–5.2)

## 2010-12-20 MED ORDER — PROMETHAZINE HCL 50 MG PO TABS: 25.0000 mg | ORAL_TABLET | Freq: Four times a day (QID) | ORAL | Status: DC | PRN

## 2010-12-20 NOTE — Assessment & Plan Note (Signed)
Obtain CBC, chem7, LFT. Take tylenol/advil alternating for fever/pain prn. Liquid diet and advance to bland solid diet as tolerated. Phenergan prn n/v and cautioned re: possible sedating effect. Suspect possible gastroenteritis but followup closely if no improvement or worsening.

## 2010-12-20 NOTE — Progress Notes (Signed)
  Subjective:    Patient ID: Larry Williamson, male    DOB: 10/06/1951, 60 y.o.   MRN: [id number]  HPI Pt presents to clinic for evaluation of fever and N/V. Notes sx's began 3 days again with N/V without abdominal pain or diarrhea. Developed fever and chills with Tmax 102.5 yesterday. C/o frontal HA primarily with NP cough but has no photophobia or neck stiffness. No known sick exposure. Emesis has stopped as of yesterday but nausea continues. No alleviating or exacerbating factors. Taking tylenol and advil for fever and pain. Denies other complaints.  Reviewed PMH, medications and allergies.    Review of Systems See HPI    Objective:   Physical Exam  Constitutional: He appears well-developed and well-nourished. No distress.  HENT:  Head: Normocephalic and atraumatic.  Right Ear: Tympanic membrane, external ear and ear canal normal.  Left Ear: Tympanic membrane, external ear and ear canal normal.  Nose: Nose normal.  Mouth/Throat: Oropharynx is clear and moist. No oropharyngeal exudate.  Eyes: Conjunctivae are normal. Pupils are equal, round, and reactive to light. Right eye exhibits no discharge. Left eye exhibits no discharge. No scleral icterus.  Neck: Normal range of motion. Neck supple.  Cardiovascular: Regular rhythm and normal heart sounds.  Tachycardia present.  Exam reveals no gallop and no friction rub.   No murmur heard. Pulmonary/Chest: Effort normal and breath sounds normal. No respiratory distress. He has no wheezes. He has no rales.  Abdominal: Soft. Bowel sounds are normal. He exhibits no distension. There is no hepatosplenomegaly. There is generalized tenderness. There is no rigidity and no guarding. A hernia is present.       Umbilical hernia without erythema or tenderness. +reducible.  Lymphadenopathy:    He has no cervical adenopathy.  Neurological: He is alert. Gait normal.  Skin: Skin is warm and dry. No rash noted. He is not diaphoretic. No cyanosis. No  pallor.          Assessment & Plan:

## 2010-12-21 LAB — BASIC METABOLIC PANEL
BUN: 20 mg/dL (ref 6–23)
CO2: 22 mEq/L (ref 19–32)
Calcium: 8.3 mg/dL — ABNORMAL LOW (ref 8.4–10.5)
Creatinine, Ser: 1.01 mg/dL (ref 0.4–1.5)
GFR calc non Af Amer: >60 mL/min (ref >60)
Potassium: 3.8 mEq/L (ref 3.5–5.1)

## 2010-12-21 LAB — CBC
HCT: 31.2 % — ABNORMAL LOW (ref 39.0–52.0)
MCV: 81.3 fL (ref 78.0–100.0)
Platelets: 188 10*3/uL (ref 150–400)
RDW: 13 % (ref 11.5–15.5)
WBC: 14.7 10*3/uL — ABNORMAL HIGH (ref 4.0–10.5)

## 2010-12-21 LAB — DIFFERENTIAL
Basophils Absolute: 0 10*3/uL (ref 0.0–0.1)
Lymphocytes Relative: 10 % — ABNORMAL LOW (ref 12–46)
Monocytes Absolute: 2 10*3/uL — ABNORMAL HIGH (ref 0.1–1.0)
Monocytes Relative: 14 % — ABNORMAL HIGH (ref 3–12)
Neutro Abs: 11.1 10*3/uL — ABNORMAL HIGH (ref 1.7–7.7)
Neutrophils Relative %: 76 % (ref 43–77)

## 2010-12-21 LAB — IRON AND TIBC

## 2010-12-21 LAB — VITAMIN B12: Vitamin B-12: 257 pg/mL (ref 211–911)

## 2010-12-21 LAB — FERRITIN: Ferritin: 207 ng/mL (ref 22–322)

## 2010-12-22 LAB — CBC
HCT: 33 % — ABNORMAL LOW (ref 39.0–52.0)
Hemoglobin: 11.6 g/dL — ABNORMAL LOW (ref 13.0–17.0)
MCV: 81.3 fL (ref 78.0–100.0)
RBC: 4.06 MIL/uL — ABNORMAL LOW (ref 4.22–5.81)
RDW: 13 % (ref 11.5–15.5)
WBC: 11.2 10*3/uL — ABNORMAL HIGH (ref 4.0–10.5)

## 2010-12-22 LAB — BASIC METABOLIC PANEL
BUN: 15 mg/dL (ref 6–23)
CO2: 24 mEq/L (ref 19–32)
Chloride: 103 mEq/L (ref 96–112)
Glucose, Bld: 109 mg/dL — ABNORMAL HIGH (ref 70–99)
Potassium: 3.7 mEq/L (ref 3.5–5.1)
Sodium: 137 mEq/L (ref 135–145)

## 2010-12-22 LAB — LIPID PANEL
HDL: 22 mg/dL — ABNORMAL LOW (ref >39)
LDL Cholesterol: 62 mg/dL (ref 0–99)
Triglycerides: 86 mg/dL (ref <150)
VLDL: 17 mg/dL (ref 0–40)

## 2010-12-23 LAB — LEGIONELLA ANTIGEN, URINE

## 2010-12-24 ENCOUNTER — Ambulatory Visit (INDEPENDENT_AMBULATORY_CARE_PROVIDER_SITE_OTHER): Payer: 59 | Admitting: Internal Medicine

## 2010-12-24 ENCOUNTER — Telehealth: Payer: Self-pay | Admitting: Internal Medicine

## 2010-12-24 ENCOUNTER — Encounter: Payer: Self-pay | Admitting: Internal Medicine

## 2010-12-24 DIAGNOSIS — R7401 Elevation of levels of liver transaminase levels: Secondary | ICD-10-CM

## 2010-12-24 DIAGNOSIS — D72829 Elevated white blood cell count, unspecified: Secondary | ICD-10-CM

## 2010-12-24 DIAGNOSIS — R748 Abnormal levels of other serum enzymes: Secondary | ICD-10-CM

## 2010-12-24 DIAGNOSIS — J189 Pneumonia, unspecified organism: Secondary | ICD-10-CM

## 2010-12-24 DIAGNOSIS — R195 Other fecal abnormalities: Secondary | ICD-10-CM

## 2010-12-24 DIAGNOSIS — R740 Nonspecific elevation of levels of transaminase and lactic acid dehydrogenase [LDH]: Secondary | ICD-10-CM

## 2010-12-24 LAB — CBC WITH DIFFERENTIAL/PLATELET
Basophils Absolute: 0 10*3/uL (ref 0.0–0.1)
Eosinophils Relative: 2.5 % (ref 0.0–5.0)
HCT: 32.6 % — ABNORMAL LOW (ref 39.0–52.0)
Hemoglobin: 11.4 g/dL — ABNORMAL LOW (ref 13.0–17.0)
Lymphs Abs: 1.7 10*3/uL (ref 0.7–4.0)
MCV: 86.1 fl (ref 78.0–100.0)
Monocytes Absolute: 1.6 10*3/uL — ABNORMAL HIGH (ref 0.1–1.0)
Monocytes Relative: 11.5 % (ref 3.0–12.0)
Neutro Abs: 10 10*3/uL — ABNORMAL HIGH (ref 1.4–7.7)
Platelets: 307 10*3/uL (ref 150.0–400.0)
RDW: 13.7 % (ref 11.5–14.6)

## 2010-12-24 LAB — HEPATIC FUNCTION PANEL
ALT: 57 U/L — ABNORMAL HIGH (ref 0–53)
AST: 42 U/L — ABNORMAL HIGH (ref 0–37)
Albumin: 2.7 g/dL — ABNORMAL LOW (ref 3.5–5.2)
Total Bilirubin: 1 mg/dL (ref 0.3–1.2)

## 2010-12-24 MED ORDER — CEFTRIAXONE SODIUM 1 G IJ SOLR: 1000.0000 mg | INTRAMUSCULAR | Status: DC

## 2010-12-24 MED ORDER — CEFTRIAXONE SODIUM 1 G IJ SOLR
1000.0000 mg | Freq: Once | INTRAMUSCULAR | Status: AC
  Administered 2010-12-24: 1000 mg via INTRAMUSCULAR

## 2010-12-24 NOTE — Telephone Encounter (Signed)
Triage vm------went to hosp and was released Saturday night with pneumonia in the left lung. Temp--100.7. Wants Debby or Dr Rodena Medin to return his call.

## 2010-12-24 NOTE — Telephone Encounter (Signed)
pls check with him

## 2010-12-24 NOTE — Telephone Encounter (Signed)
Remains with sporadic fever, and constant , non productive cough.  Will come in today to see Dr. Rodena Medin.

## 2010-12-25 ENCOUNTER — Ambulatory Visit (INDEPENDENT_AMBULATORY_CARE_PROVIDER_SITE_OTHER): Payer: 59 | Admitting: Internal Medicine

## 2010-12-25 DIAGNOSIS — J189 Pneumonia, unspecified organism: Secondary | ICD-10-CM

## 2010-12-25 LAB — METHYLMALONIC ACID, SERUM: Methylmalonic Acid, Quantitative: 144 nmol/L (ref 87–318)

## 2010-12-25 MED ORDER — CEFTRIAXONE SODIUM 1 G IJ SOLR
1000.0000 mg | INTRAMUSCULAR | Status: DC
  Administered 2010-12-25: 1000 mg via INTRAMUSCULAR

## 2010-12-26 ENCOUNTER — Telehealth: Payer: Self-pay

## 2010-12-26 ENCOUNTER — Ambulatory Visit (INDEPENDENT_AMBULATORY_CARE_PROVIDER_SITE_OTHER): Payer: 59 | Admitting: Internal Medicine

## 2010-12-26 DIAGNOSIS — J189 Pneumonia, unspecified organism: Secondary | ICD-10-CM

## 2010-12-26 MED ORDER — CEFTRIAXONE SODIUM 1 G IJ SOLR
1000.0000 mg | Freq: Once | INTRAMUSCULAR | Status: AC
  Administered 2010-12-26: 1000 mg via INTRAMUSCULAR

## 2010-12-26 NOTE — Telephone Encounter (Signed)
Message copied by Kyung Rudd on Wed Dec 26, 2010 10:39 AM ------      Message from: Letitia Libra, Maisie Fus      Created: Tue Dec 25, 2010  9:21 PM       WBC remains mildly elevated. Liver tests only slightly elevated. Hold off on second chol medication as discussed in clinic

## 2010-12-26 NOTE — Telephone Encounter (Signed)
Pt aware. Pt will call offc or go to ED if sx don't improve over the next 2-3 days

## 2010-12-28 ENCOUNTER — Other Ambulatory Visit: Payer: 59 | Admitting: Internal Medicine

## 2010-12-28 DIAGNOSIS — Z0000 Encounter for general adult medical examination without abnormal findings: Secondary | ICD-10-CM

## 2010-12-28 LAB — PSA: PSA: 2.15 ng/mL (ref 0.10–4.00)

## 2010-12-28 LAB — CBC WITH DIFFERENTIAL/PLATELET
Basophils Relative: 0.7 % (ref 0.0–3.0)
Eosinophils Relative: 3.5 % (ref 0.0–5.0)
Hemoglobin: 11.6 g/dL — ABNORMAL LOW (ref 13.0–17.0)
Lymphocytes Relative: 22.9 % (ref 12.0–46.0)
Monocytes Relative: 5.9 % (ref 3.0–12.0)
Neutro Abs: 5.6 10*3/uL (ref 1.4–7.7)
Neutrophils Relative %: 67 % (ref 43.0–77.0)
RBC: 3.96 Mil/uL — ABNORMAL LOW (ref 4.22–5.81)
WBC: 8.3 10*3/uL (ref 4.5–10.5)

## 2010-12-28 LAB — POCT URINALYSIS DIPSTICK
Bilirubin, UA: NEGATIVE
Glucose, UA: NEGATIVE
Leukocytes, UA: NEGATIVE
Nitrite, UA: NEGATIVE
pH, UA: 5

## 2010-12-28 LAB — HEPATIC FUNCTION PANEL
ALT: 140 U/L — ABNORMAL HIGH (ref 0–53)
AST: 73 U/L — ABNORMAL HIGH (ref 0–37)
Albumin: 2.9 g/dL — ABNORMAL LOW (ref 3.5–5.2)
Alkaline Phosphatase: 60 U/L (ref 39–117)
Bilirubin, Direct: 0.1 mg/dL (ref 0.0–0.3)
Total Protein: 6.8 g/dL (ref 6.0–8.3)

## 2010-12-28 LAB — LIPID PANEL
HDL: 20.1 mg/dL — ABNORMAL LOW (ref >39.00)
Total CHOL/HDL Ratio: 4

## 2010-12-28 LAB — BASIC METABOLIC PANEL
CO2: 26 mEq/L (ref 19–32)
Calcium: 8.7 mg/dL (ref 8.4–10.5)
Creatinine, Ser: 1 mg/dL (ref 0.4–1.5)
Sodium: 143 mEq/L (ref 135–145)

## 2010-12-28 NOTE — H&P (Signed)
Larry Williamson, Larry Williamson               ACCOUNT NO.:  [id number]  MEDICAL RECORD NO.:  [id number]           PATIENT TYPE:  E  LOCATION:  [facility]                         FACILITY:  [facility]  PHYSICIAN:  Mariea Stable, MD   DATE OF BIRTH:  [birth year]  DATE OF ADMISSION:  12/20/2010 DATE OF DISCHARGE:                             HISTORY & PHYSICAL   PRIMARY CARE PHYSICIAN:  Stacie Glaze, MD  CHIEF COMPLAINT:  Cough, nausea, vomiting, and fever.  HISTORY OF PRESENT ILLNESS:  Larry Williamson is a 60 year old gentleman without any significant past medical history who presents to the emergency department upon referral from his primary care physician for leukocytosis.  The patient reports that for the last 4 days he has been having a dry cough associated with nausea, vomiting, and fevers with a T- max at home of 102.7 yesterday.  He states that today he has had multiple episodes of fevers as high as 102.  He states that over this period of time, he has not gotten any better.  He has had some nausea and vomiting and unable to keep anything down for the last 3 days, although this has improved some today.  He has also had some associated shortness of breath during this time frame.  He denies any chest pain or abdominal pain, diarrhea, hematemesis, hematochezia, melena, and/or dysuria.  The patient apparently went to his primary care physician with these symptoms and had some blood work done and was noted to have marked leukocytosis and was advised to go to the emergency department for further evaluation and treatment.  PAST MEDICAL HISTORY: 1. Hypertension. 2. Hyperlipidemia. 3. History of right foot surgery secondary to trauma while working in     his house many years ago.  MEDICATIONS: 1. Lipitor 20 mg p.o. daily. 2. Benicar 40 mg p.o. daily. 3. Omeprazole 40 mg p.o. daily. 4. Cialis 5 mg p.o. p.r.n. 5. Multivitamin daily. 6. Fish oil. 7. Folic acid. 8. Osteo Bi-Flex.  ALLERGIES:   No known drug allergies.  SOCIAL HISTORY:  The patient is married.  Wife's name is Efton Thomley, phone number is [phone].  The patient has a history of tobacco use but quit 30 years ago.  He also drank alcohol socially but has quit that as of 6 years ago.  He denies any illicit drug use ever.  FAMILY HISTORY:  Noncontributory.  REVIEW OF SYSTEMS:  As per HPI.  All others reviewed and are negative.  PHYSICAL EXAMINATION:  VITAL SIGNS:  Temperature 101.9, blood pressure 120/75, heart rate of 97, respirations 16, and oxygen saturation 97% on room air. GENERAL:  This is an older man who appears quite healthy, lying in bed, undergoing breathing treatment right now, in no acute distress. HEENT:  Normocephalic and atraumatic.  Pupils equally round and reactive to light and accommodation.  Extraocular movements are intact.  Sclerae are anicteric.  Mucous membranes are moist.  There are no oropharyngeal lesions. NECK:  Supple.  There is no JVD.  There are no carotid bruits. LUNGS:  There is good air movement bilaterally, though there are some mild crackles noted in the mid  lung field.  Otherwise clear. HEART:  There is normal S1 and S2 with a regular rate and rhythm.  There are no murmurs, gallops, or rubs. ABDOMEN:  Positive bowel sounds.  Soft, nontender, and nondistended with no organomegaly.  No rebound or guarding. EXTREMITIES:  There is no cyanosis, clubbing, or edema. NEUROLOGIC:  The patient is awake, alert, and oriented x3.  Cranial nerves are intact.  Strength is intact.  Sensation is intact.  LABORATORY DATA:  WBCs 19.9 with an ANC of 17.7, hemoglobin of 12.8 with an MCV of 87, and platelets 197.  Sodium 133, potassium 4.4, chloride 98, bicarb 24, glucose 98, BUN 24, and creatinine 1.2.  Total bilirubin 2.7, alkaline phosphatase 49, AST of 19, ALT of 18, total protein of 6.8, and albumin 3.6.  IMAGING:  Chest x-ray, impression; extensive left upper  lobe consolidation.  ASSESSMENT AND PLAN: 1. Community-acquired pneumonia.  The patient's symptoms along with     leukocytosis, fever, and chest x-ray findings are all consistent     with community-acquired pneumonia.  Given the patient's dyspnea     along with persistent nausea and vomiting, we will go ahead and     admit to regular floor for IV antibiotics with Rocephin and     Zithromax.  We will go ahead and check sputum cultures if he is     able to produce any sputum since his cough has been dry thus far.     We will also check a urine strep antigen and urine Legionella     antigen.  Once the patient is tolerating orals, we can go ahead and     switch to an oral regimen for community-acquired pneumonia.  Of     note, the patient did have his flu vaccine.  He does not recall     having had a pneumonia vaccine as he does not necessarily have any     risk factors and he is under the age of 60.  This can be considered     at some point. 2. Hyponatremia.  This is very mild and likely secondary to decreased     oral intake plus or minus some minimal syndrome of inappropriate     antidiuretic hormone given his pneumonia.  We will just go ahead     and hydrate with normal saline and follow up in the morning. 3. Anemia.  This is very mild with a hemoglobin of 12.8 and MCV of 87     making it a normocytic anemia.  It is unclear of etiology, but at     this point, we will defer for further outpatient workup unless     there is a significant change or drop in hemoglobin. 4. Leukocytosis.  As per #1. 5. Hyperbilirubinemia.  Assumed this is likely secondary to National     disease as all his other LFTs are within normal limits.  Doubt that     this is secondary to hemolytic process, but we will go ahead and     check an LDH and haptoglobin given his mild anemia.  All other LFTs     are within normal limits. 6. Hypertension.  We will go ahead and continue with his home     medications. 7.  Hyperlipidemia.  We will go ahead and continue his home     medications.  CODE STATUS:  The patient is a full code.  Next of kin would be his wife, Aadan Chenier, phone number is  [phone].     Mariea Stable, MD     MA/MEDQ  D:  12/20/2010  T:  12/20/2010  Job:  [id number]  cc:   Stacie Glaze, MD  Electronically Signed by Mariea Stable MD on 12/27/2010 10:04:14 AM

## 2010-12-29 ENCOUNTER — Encounter: Payer: Self-pay | Admitting: Internal Medicine

## 2010-12-29 DIAGNOSIS — J189 Pneumonia, unspecified organism: Secondary | ICD-10-CM | POA: Insufficient documentation

## 2010-12-29 DIAGNOSIS — R748 Abnormal levels of other serum enzymes: Secondary | ICD-10-CM | POA: Insufficient documentation

## 2010-12-29 DIAGNOSIS — R195 Other fecal abnormalities: Secondary | ICD-10-CM | POA: Insufficient documentation

## 2010-12-29 NOTE — Assessment & Plan Note (Signed)
Clinically improved but complicated by return of fever. Begin rocephin one gm IM qd x 3days. Repeat CBC. Has close followup with pmd scheduled as well.

## 2010-12-29 NOTE — Assessment & Plan Note (Signed)
Obtain CBC. Given hemoccult cards x 3 to complete.

## 2010-12-29 NOTE — Assessment & Plan Note (Signed)
Hold new chol medication pending repeat LFT and PMD approval.

## 2010-12-29 NOTE — Progress Notes (Signed)
  Subjective:    Patient ID: Larry Williamson, male    DOB: 07-20-1951, 60 y.o.   MRN: [id number]  HPI Pt presents to clinic for hospital followup of pneumonia. Recently seen with fever, minimal cough and N/V. Advised to present to ED after leukocystosis of ~20k noted on CBC and was admitted. CXR demonstrated pulmonary infiltrate and pt was placed on IV abx followed by transition to po avelox upon discharge. Pt notes significant improvement while receiving iv abx however has had return of fevers upon dc and change to po abx. Reviewed elevated bilirubin prior to admission and pt states was prescribed additional chol medication during hospitalization. Also notes recent dark stools without abdominal pain, hematemesis or hematochezia.  Reviewed pmh, medications, and allegies.    Review of Systems  Constitutional: Positive for fever and fatigue. Negative for chills.  Respiratory: Positive for cough. Negative for shortness of breath.   Gastrointestinal: Negative for vomiting, abdominal pain, blood in stool and anal bleeding.  Skin: Negative for pallor and rash.       Objective:   Physical Exam  Nursing note and vitals reviewed. Constitutional: He appears well-developed and well-nourished. No distress.  HENT:  Head: Normocephalic and atraumatic.  Right Ear: External ear normal.  Left Ear: External ear normal.  Nose: Nose normal.  Eyes: Conjunctivae are normal. Right eye exhibits no discharge. Left eye exhibits no discharge. No scleral icterus.  Neck: Neck supple.  Cardiovascular: Normal rate, regular rhythm and normal heart sounds.  Exam reveals no gallop and no friction rub.   No murmur heard. Pulmonary/Chest: Effort normal and breath sounds normal. No respiratory distress. He has no wheezes. He has no rales.  Lymphadenopathy:    He has no cervical adenopathy.  Neurological: He is alert.  Skin: Skin is warm and dry. He is not diaphoretic.          Assessment & Plan:

## 2011-01-04 ENCOUNTER — Ambulatory Visit (INDEPENDENT_AMBULATORY_CARE_PROVIDER_SITE_OTHER)
Admission: RE | Admit: 2011-01-04 | Discharge: 2011-01-04 | Disposition: A | Discharge status: Home / Self Care | Payer: 59 | Source: Ambulatory Visit | Attending: Internal Medicine | Admitting: Internal Medicine

## 2011-01-04 ENCOUNTER — Ambulatory Visit (INDEPENDENT_AMBULATORY_CARE_PROVIDER_SITE_OTHER): Payer: 59 | Admitting: Internal Medicine

## 2011-01-04 ENCOUNTER — Encounter: Payer: Self-pay | Admitting: Internal Medicine

## 2011-01-04 VITALS — BP 126/82 | HR 68 | Temp 98.2°F | Resp 14 | Ht 70.0 in | Wt 199.0 lb

## 2011-01-04 DIAGNOSIS — Z0000 Encounter for general adult medical examination without abnormal findings: Secondary | ICD-10-CM

## 2011-01-04 DIAGNOSIS — R748 Abnormal levels of other serum enzymes: Secondary | ICD-10-CM

## 2011-01-04 DIAGNOSIS — R7401 Elevation of levels of liver transaminase levels: Secondary | ICD-10-CM

## 2011-01-04 DIAGNOSIS — J189 Pneumonia, unspecified organism: Secondary | ICD-10-CM

## 2011-01-04 DIAGNOSIS — R740 Nonspecific elevation of levels of transaminase and lactic acid dehydrogenase [LDH]: Secondary | ICD-10-CM

## 2011-01-04 LAB — HEPATIC FUNCTION PANEL
ALT: 49 U/L (ref 0–53)
Total Bilirubin: 0.7 mg/dL (ref 0.3–1.2)
Total Protein: 7.5 g/dL (ref 6.0–8.3)

## 2011-01-04 MED ORDER — IOHEXOL 300 MG/ML  SOLN: 80.0000 mL | Freq: Once | INTRAMUSCULAR | Status: AC | PRN

## 2011-01-04 NOTE — Assessment & Plan Note (Signed)
Has been treated for pneumonia ( hospitalized and treated OP with rocephin and avelox) still has conjestion and worsening LFTs  he still has persistent night sweats he states he wakes up sweating almost every evening his white cell count has normalized at 8.3 he has an anemia that developed along with his infection and his platelets have increased to 412 he states that he has chest congestion but no real cough certainly no productive cough he's been taking Mucinex but this does not seem to help his sense of chest congestion.  Concerned that there may be a more ominous cause for her symptomatology it may be neoplastic pneumonia that was difficult to treat I am hopeful that this does not represent some sort of post obstructive pneumonia we will proceed with a CT scan of the chest and upper abdomen to look at the liver repeat liver functions today to make sure they're not continuing to worsen we'll follow him up on a very close basis.

## 2011-01-04 NOTE — Progress Notes (Signed)
Subjective:    Patient ID: Larry Williamson, male    DOB: May 02, 1951, 60 y.o.   MRN: [id number]  HPI  patient is a 60 year old white male with A. History of recent pneumonia presents for his yearly physical examination.  He was hospitalized briefly for a pneumonia it was felt to be lobar in nature according to chest x-rays of elevated white count of approximately 20 he was treated with IV antibiotics and these were continued outpatient with Rocephin injections he was also treated for 8 additional days without a lot of foreign milligrams by mouth daily he states that the fevers have resolved and his white count has normalized to 8 however he continues to have night sweats elevated liver functions and a sensation of chest congestion with cough this may represent resolving pneumonia but is concerned because of his recent weight loss prior to this infection and the elevations of liver enzymes   Review of Systems  Constitutional: Positive for unexpected weight change. Negative for fever and fatigue.        No jaundice he appears pale however significant weight loss  HENT: Negative for hearing loss, congestion, neck pain and postnasal drip.   Eyes: Negative for discharge, redness and visual disturbance.  Respiratory: Positive for cough and chest tightness. Negative for shortness of breath and wheezing.   Cardiovascular: Negative for leg swelling.  Gastrointestinal: Negative for abdominal pain, constipation and abdominal distention.  Genitourinary: Negative for urgency and frequency.  Musculoskeletal: Negative for joint swelling and arthralgias.  Skin: Negative for color change and rash.  Neurological: Negative for weakness and light-headedness.  Hematological: Negative for adenopathy.  Psychiatric/Behavioral: Negative for behavioral problems.   Past Medical History  Diagnosis Date  . GERD (gastroesophageal reflux disease)   . Hypertension   . Anemia   . Hyperlipidemia   . Diverticulitis  of colon   . ALLERGIC RHINITIS   . History of colonic polyps    Past Surgical History  Procedure Date  . Foot surgery   . Colonoscopy w/ polypectomy     reports that he quit smoking about 30 years ago. His smoking use included Cigarettes. He has a 15 pack-year smoking history. He has quit using smokeless tobacco. He reports that he does not drink alcohol or use illicit drugs. family history includes Cancer in his father; Dementia in his mother; and Hemochromatosis in his brother. Allergies  Allergen Reactions  . Ace Inhibitors     REACTION: cough       Objective:   Physical Exam  Nursing note and vitals reviewed. Constitutional: He is oriented to person, place, and time. He appears well-developed and well-nourished.  HENT:  Head: Atraumatic.  Right Ear: External ear normal.  Left Ear: External ear normal.  Eyes: Conjunctivae are normal. Pupils are equal, round, and reactive to light. No scleral icterus.  Neck: Normal range of motion.  Cardiovascular: Normal rate and regular rhythm.   Pulmonary/Chest: Effort normal and breath sounds normal.  Abdominal: Soft. Bowel sounds are normal.  Genitourinary:        Significant new external hemorrhoids past A. Was +1 smooth nontender  Musculoskeletal: Normal range of motion.  Lymphadenopathy:    He has no cervical adenopathy.    He has no axillary adenopathy.  Neurological: He is alert and oriented to person, place, and time.          Assessment & Plan:   in terms of his complete physical examination he is stable for all health maintenance protocol his  cholesterol to goal but much lower and middle his blood pressures stable he is up-to-date with all reasonable health maintenance guidelines including immunizations I more concerned today over this pneumonia syndrome that he had the persistent shortness of breath chest tightness an elevation of liver functions therefore we will obtain repeat liver functions today and schedule him for CT  scan of his chest as soon as possible with followup in 2 weeks' time

## 2011-01-04 NOTE — Patient Instructions (Signed)
You should be called by my office in the next 2 days to schedule CT scan of the chest will liver functions today if the liver functions remain elevated there may be more tests that will be required.

## 2011-01-04 NOTE — Assessment & Plan Note (Signed)
The abnormal liver enzymes noted when he first presented with this infection have worsened after completion of antibiotics this could be an antibiotic side effect this could represent a more ominous process he is scheduled for CT scan of the chest extend this scan through the liver repeat liver functions today to make sure they're not continuing to worsen.

## 2011-01-18 ENCOUNTER — Ambulatory Visit: Payer: 59 | Admitting: Internal Medicine

## 2011-01-24 ENCOUNTER — Other Ambulatory Visit: Payer: Self-pay | Admitting: Internal Medicine

## 2011-02-08 ENCOUNTER — Telehealth: Payer: Self-pay | Admitting: *Deleted

## 2011-02-08 ENCOUNTER — Ambulatory Visit (INDEPENDENT_AMBULATORY_CARE_PROVIDER_SITE_OTHER): Payer: 59 | Admitting: Internal Medicine

## 2011-02-08 ENCOUNTER — Encounter: Payer: Self-pay | Admitting: Internal Medicine

## 2011-02-08 DIAGNOSIS — Z0000 Encounter for general adult medical examination without abnormal findings: Secondary | ICD-10-CM

## 2011-02-08 DIAGNOSIS — J189 Pneumonia, unspecified organism: Secondary | ICD-10-CM

## 2011-02-08 DIAGNOSIS — K219 Gastro-esophageal reflux disease without esophagitis: Secondary | ICD-10-CM

## 2011-02-08 DIAGNOSIS — I10 Essential (primary) hypertension: Secondary | ICD-10-CM

## 2011-02-08 NOTE — Assessment & Plan Note (Signed)
As long as he takes the Prilosec his reflux symptoms are well-controlled

## 2011-02-08 NOTE — Patient Instructions (Signed)
The we will probably see you before February of 2013 mark on your calendar that you should get a call about a CT scan of your chest from March 2013

## 2011-02-08 NOTE — Assessment & Plan Note (Signed)
CT scan shows resolution of the pneumonia a 5 mm nodule was detected with surveillance CT scheduled for one year

## 2011-02-08 NOTE — Progress Notes (Signed)
  Subjective:    Patient ID: Larry Williamson, male    DOB: Nov 02, 1950, 60 y.o.   MRN: [id number]  HPI patient is 60 year old white male presents for followup of pneumonia and also chronic followup of problems of erectile dysfunction hypertension allergic rhinitis.  He was treated with pneumonia patient with Rocephin injections and followup CT scan showed resolution of the pneumonia but identified a 5 mm nodule.  These results were discussed with the patient in detail    Review of Systems  Constitutional: Negative for fever and fatigue.  HENT: Negative for hearing loss, congestion, neck pain and postnasal drip.   Eyes: Negative for discharge, redness and visual disturbance.  Respiratory: Negative for cough, shortness of breath and wheezing.   Cardiovascular: Negative for leg swelling.  Gastrointestinal: Negative for abdominal pain, constipation and abdominal distention.  Genitourinary: Negative for urgency and frequency.  Musculoskeletal: Negative for joint swelling and arthralgias.  Skin: Negative for color change and rash.  Neurological: Negative for weakness and light-headedness.  Hematological: Negative for adenopathy.  Psychiatric/Behavioral: Negative for behavioral problems.       Past Medical History  Diagnosis Date  . GERD (gastroesophageal reflux disease)   . Hypertension   . Anemia   . Hyperlipidemia   . Diverticulitis of colon   . ALLERGIC RHINITIS   . History of colonic polyps    Past Surgical History  Procedure Date  . Foot surgery   . Colonoscopy w/ polypectomy     reports that he quit smoking about 30 years ago. His smoking use included Cigarettes. He has a 15 pack-year smoking history. He has quit using smokeless tobacco. He reports that he does not drink alcohol or use illicit drugs. family history includes Cancer in his father; Dementia in his mother; and Hemochromatosis in his brother. Allergies  Allergen Reactions  . Ace Inhibitors     REACTION:  cough    Objective:   Physical Exam  Constitutional: He is oriented to person, place, and time. He appears well-developed and well-nourished.  HENT:  Head: Normocephalic and atraumatic.  Eyes: Conjunctivae are normal. Pupils are equal, round, and reactive to light.  Neck: Normal range of motion. Neck supple.  Cardiovascular: Normal rate and regular rhythm.   Pulmonary/Chest: Effort normal and breath sounds normal.  Abdominal: Soft. Bowel sounds are normal.  Neurological: He is alert and oriented to person, place, and time.  Skin: Skin is warm and dry.  Psychiatric: He has a normal mood and affect. His behavior is normal.          Assessment & Plan:  Patient presents for followup of outpatient treated pneumonia symptoms have resolved and he is back to baseline.  We discussed the incidental finding of a 5 mm nodule in the lung and the recommendation of radiologist CT in one year to perform her line has been placed in the chart for the CT scan carmine was given to the patient his blood pressure is well-controlled will tolerate medications well his persistent erectile dysfunctions that response to Cialis Cialis samples given as mild/moderate allergic rhinitis that is flared it up on these over-the-counter antihistamines

## 2011-02-08 NOTE — Telephone Encounter (Signed)
Message copied by Melchor Amour on Fri Feb 08, 2011  2:58 PM ------      Message from: Stacie Glaze MD      Created: Fri Feb 08, 2011  9:10 AM       He will need a CT scan of the chest the polyp a 5 mm nodule in March 2013

## 2011-02-08 NOTE — Assessment & Plan Note (Signed)
The patient's blood pressure is controlled current medications Benicar 40 mg daily he was changed to Benicar because he developed a cough on an ACE inhibitor

## 2011-02-11 ENCOUNTER — Telehealth: Payer: Self-pay | Admitting: *Deleted

## 2011-02-11 NOTE — Telephone Encounter (Signed)
Message copied by Melchor Amour on Mon Feb 11, 2011  5:55 PM ------      Message from: Stacie Glaze MD      Created: Fri Feb 08, 2011  9:10 AM       He will need a CT scan of the chest the polyp a 5 mm nodule in March 2013

## 2011-02-14 ENCOUNTER — Telehealth: Payer: Self-pay | Admitting: *Deleted

## 2011-02-15 NOTE — Telephone Encounter (Signed)
Opened in error

## 2011-03-13 ENCOUNTER — Telehealth: Payer: Self-pay | Admitting: Internal Medicine

## 2011-03-20 NOTE — Telephone Encounter (Signed)
Opened in error

## 2011-05-29 ENCOUNTER — Other Ambulatory Visit: Payer: 59

## 2011-05-30 ENCOUNTER — Other Ambulatory Visit (INDEPENDENT_AMBULATORY_CARE_PROVIDER_SITE_OTHER): Payer: 59

## 2011-05-30 DIAGNOSIS — Z0000 Encounter for general adult medical examination without abnormal findings: Secondary | ICD-10-CM

## 2011-05-30 LAB — HEPATIC FUNCTION PANEL
AST: 27 U/L (ref 0–37)
Alkaline Phosphatase: 40 U/L (ref 39–117)
Total Bilirubin: 1 mg/dL (ref 0.3–1.2)

## 2011-05-30 LAB — CBC WITH DIFFERENTIAL/PLATELET
Basophils Absolute: 0 10*3/uL (ref 0.0–0.1)
Eosinophils Absolute: 0.3 10*3/uL (ref 0.0–0.7)
HCT: 41.4 % (ref 39.0–52.0)
Lymphs Abs: 2.6 10*3/uL (ref 0.7–4.0)
MCHC: 33.9 g/dL (ref 30.0–36.0)
MCV: 85.2 fl (ref 78.0–100.0)
Monocytes Absolute: 0.5 10*3/uL (ref 0.1–1.0)
Neutrophils Relative %: 44 % (ref 43.0–77.0)
Platelets: 188 10*3/uL (ref 150.0–400.0)
RDW: 14.9 % — ABNORMAL HIGH (ref 11.5–14.6)
WBC: 6.2 10*3/uL (ref 4.5–10.5)

## 2011-05-30 LAB — LIPID PANEL
LDL Cholesterol: 66 mg/dL (ref 0–99)
VLDL: 21.8 mg/dL (ref 0.0–40.0)

## 2011-05-30 LAB — BASIC METABOLIC PANEL
Chloride: 104 mEq/L (ref 96–112)
GFR: 69.46 mL/min (ref >60.00)
Glucose, Bld: 96 mg/dL (ref 70–99)
Potassium: 5.1 mEq/L (ref 3.5–5.1)
Sodium: 140 mEq/L (ref 135–145)

## 2011-05-30 LAB — PSA: PSA: 1.72 ng/mL (ref 0.10–4.00)

## 2011-06-05 ENCOUNTER — Ambulatory Visit (INDEPENDENT_AMBULATORY_CARE_PROVIDER_SITE_OTHER): Payer: 59 | Admitting: Internal Medicine

## 2011-06-05 ENCOUNTER — Encounter: Payer: Self-pay | Admitting: Internal Medicine

## 2011-06-05 VITALS — BP 130/80 | HR 76 | Temp 98.4°F | Resp 16 | Ht 70.0 in | Wt 219.0 lb

## 2011-06-05 DIAGNOSIS — Z0000 Encounter for general adult medical examination without abnormal findings: Secondary | ICD-10-CM

## 2011-06-05 NOTE — Progress Notes (Signed)
  Subjective:    Patient ID: Larry Williamson, male    DOB: 08-10-1951, 60 y.o.   MRN: [id number]  HPI  cpx  Review of Systems  Constitutional: Negative for fever and fatigue.  HENT: Negative for hearing loss, congestion, neck pain and postnasal drip.   Eyes: Negative for discharge, redness and visual disturbance.  Respiratory: Negative for cough, shortness of breath and wheezing.   Cardiovascular: Negative for leg swelling.  Gastrointestinal: Negative for abdominal pain, constipation and abdominal distention.  Genitourinary: Negative for urgency and frequency.  Musculoskeletal: Negative for joint swelling and arthralgias.  Skin: Negative for color change and rash.  Neurological: Negative for weakness and light-headedness.  Hematological: Negative for adenopathy.  Psychiatric/Behavioral: Negative for behavioral problems.       Past Medical History  Diagnosis Date  . GERD (gastroesophageal reflux disease)   . Hypertension   . Anemia   . Hyperlipidemia   . Diverticulitis of colon   . ALLERGIC RHINITIS   . History of colonic polyps    Past Surgical History  Procedure Date  . Foot surgery   . Colonoscopy w/ polypectomy     reports that he quit smoking about 32 years ago. His smoking use included Cigarettes. He has a 15 pack-year smoking history. He has quit using smokeless tobacco. He reports that he does not drink alcohol or use illicit drugs. family history includes Cancer in his father; Dementia in his mother; and Hemochromatosis in his brother. Allergies  Allergen Reactions  . Ace Inhibitors     REACTION: cough    Objective:   Physical Exam  Constitutional: He appears well-developed and well-nourished.  HENT:  Head: Normocephalic and atraumatic.  Eyes: Conjunctivae are normal. Pupils are equal, round, and reactive to light.  Neck: Normal range of motion. Neck supple.  Cardiovascular: Normal rate and regular rhythm.   Pulmonary/Chest: Effort normal and breath  sounds normal.  Abdominal: Soft. Bowel sounds are normal.          Assessment & Plan:   Patient presents for yearly preventative medicine examination.   all immunizations and health maintenance protocols were reviewed with the patient and they are up to date with these protocols.   screening laboratory values were reviewed with the patient including screening of hyperlipidemia PSA renal function and hepatic function.   There medications past medical history social history problem list and allergies were reviewed in detail.   Goals were established with regard to weight loss exercise diet in compliance with medications  Lipids are at goal anemia is stable erectile  dysfunction addressed hypertension is stable

## 2011-06-20 ENCOUNTER — Other Ambulatory Visit: Payer: Self-pay | Admitting: Internal Medicine

## 2011-06-21 ENCOUNTER — Other Ambulatory Visit: Payer: Self-pay | Admitting: *Deleted

## 2011-06-21 MED ORDER — PROMETHAZINE HCL 50 MG PO TABS: 25.0000 mg | ORAL_TABLET | Freq: Four times a day (QID) | ORAL | Status: AC | PRN

## 2011-06-21 MED ORDER — OMEPRAZOLE 40 MG PO CPDR: 40.0000 mg | DELAYED_RELEASE_CAPSULE | Freq: Every day | ORAL | Status: DC

## 2011-07-22 ENCOUNTER — Telehealth: Payer: Self-pay | Admitting: *Deleted

## 2011-07-22 MED ORDER — AZITHROMYCIN 250 MG PO TABS: ORAL_TABLET | ORAL | Status: AC

## 2011-07-22 NOTE — Telephone Encounter (Signed)
z pack and robitussion fast max cough and cold

## 2011-07-22 NOTE — Telephone Encounter (Signed)
Called to pharmacy and they will call pt.

## 2011-07-22 NOTE — Telephone Encounter (Signed)
Pt has had congestion, sneezing, runny nose x one month.  No fever or cough.  Would like RX.

## 2011-07-26 ENCOUNTER — Encounter: Payer: Self-pay | Admitting: Gastroenterology

## 2011-07-30 ENCOUNTER — Ambulatory Visit (INDEPENDENT_AMBULATORY_CARE_PROVIDER_SITE_OTHER): Payer: 59

## 2011-07-30 DIAGNOSIS — Z23 Encounter for immunization: Secondary | ICD-10-CM

## 2011-08-09 ENCOUNTER — Other Ambulatory Visit: Payer: Self-pay | Admitting: Internal Medicine

## 2011-08-19 ENCOUNTER — Encounter: Payer: Self-pay | Admitting: Gastroenterology

## 2011-09-23 ENCOUNTER — Encounter: Payer: Self-pay | Admitting: Gastroenterology

## 2011-09-24 ENCOUNTER — Other Ambulatory Visit: Payer: 59 | Admitting: Gastroenterology

## 2011-10-24 ENCOUNTER — Encounter: Payer: Self-pay | Admitting: Gastroenterology

## 2011-10-24 ENCOUNTER — Ambulatory Visit (AMBULATORY_SURGERY_CENTER): Payer: 59 | Admitting: *Deleted

## 2011-10-24 VITALS — Ht 70.0 in | Wt 225.0 lb

## 2011-10-24 DIAGNOSIS — Z1211 Encounter for screening for malignant neoplasm of colon: Secondary | ICD-10-CM

## 2011-10-24 MED ORDER — PEG-KCL-NACL-NASULF-NA ASC-C 100 G PO SOLR: ORAL | Status: DC

## 2011-11-05 ENCOUNTER — Encounter: Payer: Self-pay | Admitting: Gastroenterology

## 2011-11-05 ENCOUNTER — Ambulatory Visit (AMBULATORY_SURGERY_CENTER): Payer: 59 | Admitting: Gastroenterology

## 2011-11-05 DIAGNOSIS — Z86010 Personal history of colonic polyps: Secondary | ICD-10-CM

## 2011-11-05 DIAGNOSIS — Z1211 Encounter for screening for malignant neoplasm of colon: Secondary | ICD-10-CM

## 2011-11-05 MED ORDER — SODIUM CHLORIDE 0.9 % IV SOLN: 500.0000 mL | INTRAVENOUS | Status: DC

## 2011-11-05 NOTE — Progress Notes (Signed)
Patient did not experience any of the following events: a burn prior to discharge; a fall within the facility; wrong site/side/patient/procedure/implant event; or a hospital transfer or hospital admission upon discharge from the facility. (G8907) Patient did not have preoperative order for IV antibiotic SSI prophylaxis. (G8918)  

## 2011-11-05 NOTE — Op Note (Signed)
[facility] [address] [place], [place]  [address]  COLONOSCOPY PROCEDURE REPORT  PATIENT:  Larry Williamson, Larry Williamson  MR#:  [id number] BIRTHDATE:  1950-11-10, 60 yrs. old  GENDER:  male ENDOSCOPIST:  Judie Petit T. Russella Dar, MD, [facility]  PROCEDURE DATE:  11/05/2011 PROCEDURE:  Colonoscopy [address] ASA CLASS:  Class II INDICATIONS:  1) surveillance and high-risk screening  2) history of pre-cancerous (adenomatous) colon polyps : 06/2006. MEDICATIONS:   These medications were titrated to patient response per physician's verbal order, Fentanyl 75 mcg IV, Versed 8 mg IV DESCRIPTION OF PROCEDURE:   After the risks benefits and alternatives of the procedure were thoroughly explained, informed consent was obtained.  Digital rectal exam was performed and revealed no abnormalities.   The LB 180AL [id number] endoscope was introduced through the anus and advanced to the cecum, which was identified by both the appendix and ileocecal valve, without limitations.  The quality of the prep was excellent, using MoviPrep.  The instrument was then slowly withdrawn as the colon was fully examined. <<PROCEDUREIMAGES>> FINDINGS:  Moderate diverticulosis was found in the sigmoid to descending colon.  Otherwise normal colonoscopy without other polyps, masses, vascular ectasias, or inflammatory changes. Retroflexed views in the rectum revealed no abnormalities.  The time to cecum =  2.75  minutes. The scope was then withdrawn (time =  8.5  min) from the patient and the procedure completed.  COMPLICATIONS:  None  ENDOSCOPIC IMPRESSION: 1) Moderate diverticulosis in the sigmoid to descending colon  RECOMMENDATIONS: 1) High fiber diet with liberal fluid intake. 2) Repeat Colonoscopy in 5 years.  Venita Lick. Russella Dar, MD, [facility]  n. eSIGNED:   Venita Lick. Stark at 11/05/2011 08:56 AM  Collie Siad, [phone]

## 2011-11-05 NOTE — Patient Instructions (Signed)
Discharge instructions given with verbal understanding. Handouts on diverticulosis and a high fiber diet given. Resume previous medications. 

## 2011-11-06 ENCOUNTER — Telehealth: Payer: Self-pay | Admitting: *Deleted

## 2011-11-06 NOTE — Telephone Encounter (Signed)

## 2011-12-18 ENCOUNTER — Ambulatory Visit (INDEPENDENT_AMBULATORY_CARE_PROVIDER_SITE_OTHER): Payer: 59 | Admitting: Internal Medicine

## 2011-12-18 ENCOUNTER — Encounter: Payer: Self-pay | Admitting: Internal Medicine

## 2011-12-18 VITALS — BP 140/80 | HR 72 | Temp 98.2°F | Resp 16 | Ht 70.0 in | Wt 230.0 lb

## 2011-12-18 DIAGNOSIS — E785 Hyperlipidemia, unspecified: Secondary | ICD-10-CM

## 2011-12-18 DIAGNOSIS — I10 Essential (primary) hypertension: Secondary | ICD-10-CM

## 2011-12-18 DIAGNOSIS — R911 Solitary pulmonary nodule: Secondary | ICD-10-CM

## 2011-12-18 DIAGNOSIS — T887XXA Unspecified adverse effect of drug or medicament, initial encounter: Secondary | ICD-10-CM

## 2011-12-18 LAB — BASIC METABOLIC PANEL
CO2: 26 mEq/L (ref 19–32)
Calcium: 9.9 mg/dL (ref 8.4–10.5)
GFR: 76.23 mL/min (ref >60.00)
Glucose, Bld: 109 mg/dL — ABNORMAL HIGH (ref 70–99)
Potassium: 4.7 mEq/L (ref 3.5–5.1)
Sodium: 141 mEq/L (ref 135–145)

## 2011-12-18 LAB — LIPID PANEL
HDL: 41.6 mg/dL (ref >39.00)
LDL Cholesterol: 58 mg/dL (ref 0–99)
VLDL: 21.8 mg/dL (ref 0.0–40.0)

## 2011-12-18 LAB — HEPATIC FUNCTION PANEL
AST: 28 U/L (ref 0–37)
Albumin: 4.3 g/dL (ref 3.5–5.2)
Total Bilirubin: 1 mg/dL (ref 0.3–1.2)

## 2011-12-18 LAB — LDL CHOLESTEROL, DIRECT: Direct LDL: 67.6 mg/dL

## 2011-12-18 NOTE — Patient Instructions (Signed)
Be sure to take a Aspirin 82 mg a dauy

## 2011-12-18 NOTE — Progress Notes (Signed)
Subjective:    Patient ID: Larry Williamson, male    DOB: 1951/06/07, 61 y.o.   MRN: [id number]  HPI Weight gain Has admitted to over eating and to lack of exercise. GERD has been stable There has been a significant rise in the blood pressure with the weight gain  Back pain stable  lipitor for lipids  The patient also presents for followup of a positive CT scan after an infection that showed some groundglass appearance to the upper lobe interval CT showed improvement however radiology recommended a one-year followup noncontrasted CT to assure [place] that there was no other process involved    Review of Systems  Constitutional: Negative for fever and fatigue.  HENT: Negative for hearing loss, congestion, neck pain and postnasal drip.   Eyes: Negative for discharge, redness and visual disturbance.  Respiratory: Negative for cough, shortness of breath and wheezing.   Cardiovascular: Negative for leg swelling.  Gastrointestinal: Negative for abdominal pain, constipation and abdominal distention.  Genitourinary: Negative for urgency and frequency.  Musculoskeletal: Negative for joint swelling and arthralgias.  Skin: Negative for color change and rash.  Neurological: Negative for weakness and light-headedness.  Hematological: Negative for adenopathy.  Psychiatric/Behavioral: Negative for behavioral problems.       Past Medical History  Diagnosis Date  . GERD (gastroesophageal reflux disease)   . Hypertension   . Anemia   . Hyperlipidemia   . Diverticulitis of colon   . ALLERGIC RHINITIS   . Ulcer 1992    gastric  . Adenomatous colon polyp     History   Social History  . Marital Status: Married    Spouse Name: N/A    Number of Children: N/A  . Years of Education: N/A   Occupational History  . Not on file.   Social History Main Topics  . Smoking status: Former Smoker -- 1.0 packs/day for 15 years    Types: Cigarettes    Quit date: 10/21/1978  . Smokeless  tobacco: Never Used  . Alcohol Use: No     Quit drinking 7 yrs ago  . Drug Use: No  . Sexually Active: Yes   Other Topics Concern  . Not on file   Social History Narrative   Designated Party Release signed on 03/15/10    Past Surgical History  Procedure Date  . Foot surgery   . Colonoscopy w/ polypectomy     Family History  Problem Relation Age of Onset  . Cancer Father     lung  . Hemochromatosis Brother   . Dementia Mother   . Colon cancer Neg Hx     Allergies  Allergen Reactions  . Ace Inhibitors     REACTION: cough    Current Outpatient Prescriptions on File Prior to Visit  Medication Sig Dispense Refill  . aspirin 81 MG tablet Take 81 mg by mouth daily.        [name] BENICAR 40 MG tablet TAKE 1 TABLET BY MOUTH ONCE DAILY  90 tablet  1  . CIALIS 5 MG tablet TAKE 1 TABLET BY MOUTH ONCE DAILY  30 tablet  7  . Folic Acid 200 MCG TABS Take 2 tablets by mouth daily.        [name] LIPITOR 20 MG tablet take 1 tablet by mouth once daily  30 tablet  11  . MULTIPLE VITAMIN PO Take by mouth daily.        . Omega-3 Fatty Acids (FISH OIL) 1000 MG CAPS Take 2 capsules by  mouth 2 (two) times daily.        [name] omeprazole (PRILOSEC) 40 MG capsule Take 1 capsule (40 mg total) by mouth daily.  30 capsule  9   Current Facility-Administered Medications on File Prior to Visit  Medication Dose Route Frequency Provider Last Rate Last Dose  . 0.9 %  sodium chloride infusion  500 mL Intravenous Continuous Eliezer Bottom., MD,FACG      . DISCONTD: cefTRIAXone (ROCEPHIN) injection 1,000 mg  1,000 mg Intramuscular Q24H Ala Dach Letitia Libra., MD   1,000 mg at 12/25/10 0946    BP 140/80  Pulse 72  Temp 98.2 F (36.8 C)  Resp 16  Ht 5\' 10"  (1.778 m)  Wt 230 lb (104.327 kg)  BMI 33.00 kg/m2    Objective:   Physical Exam  Nursing note and vitals reviewed. Constitutional: He appears well-developed and well-nourished.  HENT:  Head: Normocephalic and atraumatic.  Eyes: Conjunctivae are  normal. Pupils are equal, round, and reactive to light.  Neck: Normal range of motion. Neck supple.  Cardiovascular: Normal rate and regular rhythm.   Pulmonary/Chest: Effort normal and breath sounds normal.  Abdominal: Soft. Bowel sounds are normal.          Assessment & Plan:  Unfortunately the patient has gained weight and his blood pressure does show that by elevated to the borderline of hypertension.  He will eyes as that weight loss and exercise are important part of his treatment and will follow through with the program of losing between 10 and 20 pounds over the next few months.  We will monitor the effectiveness of his Lipitor with a lipid and liver today we will look at a basic metabolic panel to look at renal function.  We will order a CT scan of the chest without contrast for surveillance of the lesion seen one year ago

## 2012-01-06 ENCOUNTER — Other Ambulatory Visit: Payer: 59

## 2012-01-15 ENCOUNTER — Ambulatory Visit (INDEPENDENT_AMBULATORY_CARE_PROVIDER_SITE_OTHER)
Admission: RE | Admit: 2012-01-15 | Discharge: 2012-01-15 | Disposition: A | Discharge status: Home / Self Care | Payer: 59 | Source: Ambulatory Visit | Attending: Internal Medicine | Admitting: Internal Medicine

## 2012-01-15 DIAGNOSIS — R911 Solitary pulmonary nodule: Secondary | ICD-10-CM

## 2012-06-16 ENCOUNTER — Encounter: Payer: Self-pay | Admitting: Internal Medicine

## 2012-06-16 ENCOUNTER — Ambulatory Visit (INDEPENDENT_AMBULATORY_CARE_PROVIDER_SITE_OTHER): Payer: 59 | Admitting: Internal Medicine

## 2012-06-16 VITALS — BP 140/80 | HR 76 | Temp 98.6°F | Resp 16 | Ht 70.0 in | Wt 224.0 lb

## 2012-06-16 DIAGNOSIS — I10 Essential (primary) hypertension: Secondary | ICD-10-CM

## 2012-06-16 DIAGNOSIS — F528 Other sexual dysfunction not due to a substance or known physiological condition: Secondary | ICD-10-CM

## 2012-06-16 DIAGNOSIS — E785 Hyperlipidemia, unspecified: Secondary | ICD-10-CM

## 2012-06-16 DIAGNOSIS — K219 Gastro-esophageal reflux disease without esophagitis: Secondary | ICD-10-CM

## 2012-06-16 NOTE — Patient Instructions (Signed)
The patient is instructed to continue all medications as prescribed. Schedule followup with check out clerk upon leaving the clinic  

## 2012-06-16 NOTE — Progress Notes (Signed)
Subjective:    Patient ID: Larry Williamson, male    DOB: 03-Jan-1951, 61 y.o.   MRN: [id number]  HPI Patient is 61 year old male who presents for followup of hypertension hyperlipidemia and a history of gastroesophageal reflux.  He also has erectile dysfunction we discussed testosterone andED drugs.  Staple hypertension hyperlipidemia are moderately well-controlled weight and weight control are an issue   Review of Systems  Constitutional: Negative for fever and fatigue.  HENT: Negative for hearing loss, congestion, neck pain and postnasal drip.   Eyes: Negative for discharge, redness and visual disturbance.  Respiratory: Negative for cough, shortness of breath and wheezing.   Cardiovascular: Negative for leg swelling.  Gastrointestinal: Negative for abdominal pain, constipation and abdominal distention.  Genitourinary: Negative for urgency and frequency.  Musculoskeletal: Negative for joint swelling and arthralgias.  Skin: Negative for color change and rash.  Neurological: Negative for weakness and light-headedness.  Hematological: Negative for adenopathy.  Psychiatric/Behavioral: Negative for behavioral problems.   Past Medical History  Diagnosis Date  . GERD (gastroesophageal reflux disease)   . Hypertension   . Anemia   . Hyperlipidemia   . Diverticulitis of colon   . ALLERGIC RHINITIS   . Ulcer 1992    gastric  . Adenomatous colon polyp     History   Social History  . Marital Status: Married    Spouse Name: N/A    Number of Children: N/A  . Years of Education: N/A   Occupational History  . Not on file.   Social History Main Topics  . Smoking status: Former Smoker -- 1.0 packs/day for 15 years    Types: Cigarettes    Quit date: 10/21/1978  . Smokeless tobacco: Never Used  . Alcohol Use: No     Quit drinking 7 yrs ago  . Drug Use: No  . Sexually Active: Yes   Other Topics Concern  . Not on file   Social History Narrative   Designated Party  Release signed on 03/15/10    Past Surgical History  Procedure Date  . Foot surgery   . Colonoscopy w/ polypectomy     Family History  Problem Relation Age of Onset  . Cancer Father     lung  . Hemochromatosis Brother   . Dementia Mother   . Colon cancer Neg Hx     Allergies  Allergen Reactions  . Ace Inhibitors     REACTION: cough    Current Outpatient Prescriptions on File Prior to Visit  Medication Sig Dispense Refill  . aspirin 81 MG tablet Take 81 mg by mouth daily.        [name] BENICAR 40 MG tablet TAKE 1 TABLET BY MOUTH ONCE DAILY  90 tablet  1  . CIALIS 5 MG tablet TAKE 1 TABLET BY MOUTH ONCE DAILY  30 tablet  7  . Folic Acid 200 MCG TABS Take 2 tablets by mouth daily.        [name] LIPITOR 20 MG tablet take 1 tablet by mouth once daily  30 tablet  11  . MULTIPLE VITAMIN PO Take by mouth daily.        . Omega-3 Fatty Acids (FISH OIL) 1000 MG CAPS Take 2 capsules by mouth 2 (two) times daily.        [name] omeprazole (PRILOSEC) 40 MG capsule Take 1 capsule (40 mg total) by mouth daily.  30 capsule  9   Current Facility-Administered Medications on File Prior to Visit  Medication Dose Route Frequency  Provider Last Rate Last Dose  . 0.9 %  sodium chloride infusion  500 mL Intravenous Continuous Meryl Dare, MD,FACG        BP 140/80  Pulse 76  Temp 98.6 F (37 C)  Resp 16  Ht 5\' 10"  (1.778 m)  Wt 224 lb (101.606 kg)  BMI 32.14 kg/m2        Objective:   Physical Exam  Nursing note and vitals reviewed. Constitutional: He appears well-developed and well-nourished.  HENT:  Head: Normocephalic and atraumatic.  Eyes: Conjunctivae are normal. Pupils are equal, round, and reactive to light.  Neck: Normal range of motion. Neck supple.  Cardiovascular: Normal rate and regular rhythm.   Pulmonary/Chest: Effort normal and breath sounds normal.  Abdominal: Soft. Bowel sounds are normal.          Assessment & Plan:  Discussed laboratory results for hyperlipidemia and  hypertension.  Stable hypertension current medications but weight gain is making control of lipids and hypertension more difficult.  Reviewed dietary plan suggested to gluten free diet. Erectile dysfunction discussed samples of Viagra given

## 2012-07-01 ENCOUNTER — Other Ambulatory Visit: Payer: Self-pay | Admitting: Internal Medicine

## 2012-07-02 ENCOUNTER — Ambulatory Visit: Payer: 59

## 2012-07-10 ENCOUNTER — Ambulatory Visit (INDEPENDENT_AMBULATORY_CARE_PROVIDER_SITE_OTHER): Payer: 59

## 2012-07-10 DIAGNOSIS — Z23 Encounter for immunization: Secondary | ICD-10-CM

## 2012-08-22 ENCOUNTER — Other Ambulatory Visit: Payer: Self-pay | Admitting: Internal Medicine

## 2012-08-25 ENCOUNTER — Other Ambulatory Visit: Payer: Self-pay | Admitting: Internal Medicine

## 2012-08-26 ENCOUNTER — Telehealth: Payer: Self-pay | Admitting: *Deleted

## 2012-08-26 MED ORDER — TADALAFIL 5 MG PO TABS: 5.0000 mg | ORAL_TABLET | Freq: Every day | ORAL | Status: DC | PRN

## 2012-08-26 NOTE — Telephone Encounter (Signed)
done

## 2012-09-07 ENCOUNTER — Other Ambulatory Visit: Payer: Self-pay | Admitting: Internal Medicine

## 2012-10-01 ENCOUNTER — Other Ambulatory Visit: Payer: Self-pay | Admitting: *Deleted

## 2012-10-01 MED ORDER — TADALAFIL 5 MG PO TABS: 5.0000 mg | ORAL_TABLET | Freq: Every day | ORAL | Status: DC | PRN

## 2012-12-21 ENCOUNTER — Encounter: Payer: Self-pay | Admitting: Internal Medicine

## 2012-12-21 ENCOUNTER — Ambulatory Visit (INDEPENDENT_AMBULATORY_CARE_PROVIDER_SITE_OTHER): Payer: 59 | Admitting: Internal Medicine

## 2012-12-21 VITALS — BP 140/80 | HR 60 | Temp 98.1°F | Resp 16 | Ht 70.0 in | Wt 230.0 lb

## 2012-12-21 DIAGNOSIS — T887XXA Unspecified adverse effect of drug or medicament, initial encounter: Secondary | ICD-10-CM

## 2012-12-21 DIAGNOSIS — F528 Other sexual dysfunction not due to a substance or known physiological condition: Secondary | ICD-10-CM

## 2012-12-21 DIAGNOSIS — I10 Essential (primary) hypertension: Secondary | ICD-10-CM

## 2012-12-21 DIAGNOSIS — D508 Other iron deficiency anemias: Secondary | ICD-10-CM

## 2012-12-21 DIAGNOSIS — K219 Gastro-esophageal reflux disease without esophagitis: Secondary | ICD-10-CM

## 2012-12-21 DIAGNOSIS — E785 Hyperlipidemia, unspecified: Secondary | ICD-10-CM

## 2012-12-21 LAB — BASIC METABOLIC PANEL
CO2: 26 mEq/L (ref 19–32)
Calcium: 9.5 mg/dL (ref 8.4–10.5)
Creatinine, Ser: 1.2 mg/dL (ref 0.4–1.5)
GFR: 67.06 mL/min (ref >60.00)

## 2012-12-21 LAB — CBC WITH DIFFERENTIAL/PLATELET
Eosinophils Relative: 2.9 % (ref 0.0–5.0)
MCV: 80.7 fl (ref 78.0–100.0)
Monocytes Absolute: 0.7 10*3/uL (ref 0.1–1.0)
Neutrophils Relative %: 50.4 % (ref 43.0–77.0)
Platelets: 185 10*3/uL (ref 150.0–400.0)
WBC: 7.2 10*3/uL (ref 4.5–10.5)

## 2012-12-21 LAB — LDL CHOLESTEROL, DIRECT: Direct LDL: 85 mg/dL

## 2012-12-21 LAB — LIPID PANEL
Cholesterol: 146 mg/dL (ref 0–200)
HDL: 34.2 mg/dL — ABNORMAL LOW (ref >39.00)
Total CHOL/HDL Ratio: 4
Triglycerides: 207 mg/dL — ABNORMAL HIGH (ref 0.0–149.0)
VLDL: 41.4 mg/dL — ABNORMAL HIGH (ref 0.0–40.0)

## 2012-12-21 LAB — TSH: TSH: 3.33 u[IU]/mL (ref 0.35–5.50)

## 2012-12-21 LAB — HEPATIC FUNCTION PANEL: Total Bilirubin: 1 mg/dL (ref 0.3–1.2)

## 2012-12-21 NOTE — Patient Instructions (Addendum)
Weight loss needed Increased weight plays a role in poor blood pressure

## 2012-12-21 NOTE — Addendum Note (Signed)
Addended by: Stacie Glaze on: 12/21/2012 08:45 AM   Modules accepted: Orders

## 2012-12-21 NOTE — Progress Notes (Signed)
Subjective:    Patient ID: Larry Williamson, male    DOB: 08/02/1951, 62 y.o.   MRN: [id number]  HPI Patient is a 62 year old male who presents for followup of chronic medical problems including hyperlipidemia hypertension and gastroesophageal reflux his last screening blood work was in February of 2013 he presents today for screening blood work in for assessment of control with his current medications. There medications were reviewed which includes Lipitor generic 20 mg by mouth  Benicar 40 mg by mouth daily and Prilosec for reflux.  He also takes a supplement of omega-3 fatty acids and folic acid he takes an aspirin a day as a prophylaxis    Review of Systems  Constitutional: Negative for fever and fatigue.  HENT: Negative for hearing loss, congestion, neck pain and postnasal drip.   Eyes: Negative for discharge, redness and visual disturbance.  Respiratory: Negative for cough, shortness of breath and wheezing.   Cardiovascular: Negative for leg swelling.  Gastrointestinal: Negative for abdominal pain, constipation and abdominal distention.  Genitourinary: Negative for urgency and frequency.  Musculoskeletal: Negative for joint swelling and arthralgias.  Skin: Negative for color change and rash.  Neurological: Negative for weakness and light-headedness.  Hematological: Negative for adenopathy.  Psychiatric/Behavioral: Negative for behavioral problems.   Past Medical History  Diagnosis Date  . GERD (gastroesophageal reflux disease)   . Hypertension   . Anemia   . Hyperlipidemia   . Diverticulitis of colon   . ALLERGIC RHINITIS   . Ulcer 1992    gastric  . Adenomatous colon polyp     History   Social History  . Marital Status: Married    Spouse Name: N/A    Number of Children: N/A  . Years of Education: N/A   Occupational History  . Not on file.   Social History Main Topics  . Smoking status: Former Smoker -- 1.00 packs/day for 15 years    Types: Cigarettes    Quit date: 10/21/1978  . Smokeless tobacco: Never Used  . Alcohol Use: No     Comment: Quit drinking 7 yrs ago  . Drug Use: No  . Sexually Active: Yes   Other Topics Concern  . Not on file   Social History Narrative   Designated Party Release signed on 03/15/10    Past Surgical History  Procedure Laterality Date  . Foot surgery    . Colonoscopy w/ polypectomy      Family History  Problem Relation Age of Onset  . Cancer Father     lung  . Hemochromatosis Brother   . Dementia Mother   . Colon cancer Neg Hx     Allergies  Allergen Reactions  . Ace Inhibitors     REACTION: cough    Current Outpatient Prescriptions on File Prior to Visit  Medication Sig Dispense Refill  . aspirin 81 MG tablet Take 81 mg by mouth daily.        [name] atorvastatin (LIPITOR) 20 MG tablet take 1 tablet by mouth once daily  90 tablet  3  . BENICAR 40 MG tablet TAKE 1 TABLET BY MOUTH ONCE DAILY  90 each  3  . Folic Acid 200 MCG TABS Take 2 tablets by mouth daily.        . MULTIPLE VITAMIN PO Take by mouth daily.        . Omega-3 Fatty Acids (FISH OIL) 1000 MG CAPS Take 2 capsules by mouth 2 (two) times daily.        [name]  omeprazole (PRILOSEC) 40 MG capsule take 1 capsule by mouth once daily  30 capsule  9  . tadalafil (CIALIS) 5 MG tablet Take 1 tablet (5 mg total) by mouth daily as needed for erectile dysfunction.  7 tablet  5  . tadalafil (CIALIS) 5 MG tablet Take 1 tablet (5 mg total) by mouth daily as needed for erectile dysfunction.  30 tablet  5   Current Facility-Administered Medications on File Prior to Visit  Medication Dose Route Frequency Provider Last Rate Last Dose  . 0.9 %  sodium chloride infusion  500 mL Intravenous Continuous Meryl Dare, MD,FACG        BP 140/80  Pulse 60  Temp(Src) 98.1 F (36.7 C)  Resp 16  Ht 5\' 10"  (1.778 m)  Wt 230 lb (104.327 kg)  BMI 33 kg/m2       Objective:   Physical Exam  Nursing note and vitals reviewed. Constitutional: He appears  well-developed and well-nourished.  HENT:  Head: Normocephalic and atraumatic.  Eyes: Conjunctivae are normal. Pupils are equal, round, and reactive to light.  Neck: Normal range of motion. Neck supple.  Cardiovascular: Normal rate and regular rhythm.   Pulmonary/Chest: Effort normal and breath sounds normal.  Abdominal: Soft. Bowel sounds are normal.          Assessment & Plan:  Appropriate screening blood work will be drawn today including a lipid panel to assess the effect of Lipitor and visual a liver to make sure that there is no untoward side effects a basic metabolic panel to monitor the renal function on a ARB type drug and a CBC differential for safety given that he has a history of GERD and is on Prilosec for hyperacidity.  Samples of Cialis and Benicar will be given

## 2013-04-22 ENCOUNTER — Other Ambulatory Visit: Payer: Self-pay | Admitting: Internal Medicine

## 2013-06-28 ENCOUNTER — Other Ambulatory Visit (INDEPENDENT_AMBULATORY_CARE_PROVIDER_SITE_OTHER): Payer: 59

## 2013-06-28 DIAGNOSIS — Z0000 Encounter for general adult medical examination without abnormal findings: Secondary | ICD-10-CM

## 2013-06-28 LAB — LIPID PANEL
LDL Cholesterol: 93 mg/dL (ref 0–99)
VLDL: 34.6 mg/dL (ref 0.0–40.0)

## 2013-06-28 LAB — CBC WITH DIFFERENTIAL/PLATELET
Basophils Relative: 0.5 % (ref 0.0–3.0)
Hemoglobin: 14.9 g/dL (ref 13.0–17.0)
Lymphocytes Relative: 35.5 % (ref 12.0–46.0)
MCHC: 34.4 g/dL (ref 30.0–36.0)
Monocytes Relative: 8.9 % (ref 3.0–12.0)
Neutro Abs: 3.5 10*3/uL (ref 1.4–7.7)
RBC: 5.16 Mil/uL (ref 4.22–5.81)

## 2013-06-28 LAB — HEPATIC FUNCTION PANEL
ALT: 57 U/L — ABNORMAL HIGH (ref 0–53)
AST: 41 U/L — ABNORMAL HIGH (ref 0–37)
Albumin: 4 g/dL (ref 3.5–5.2)
Alkaline Phosphatase: 33 U/L — ABNORMAL LOW (ref 39–117)
Total Protein: 7.1 g/dL (ref 6.0–8.3)

## 2013-06-28 LAB — POCT URINALYSIS DIPSTICK
Ketones, UA: NEGATIVE
Protein, UA: NEGATIVE
Spec Grav, UA: 1.025
pH, UA: 6

## 2013-06-28 LAB — BASIC METABOLIC PANEL
CO2: 28 mEq/L (ref 19–32)
Calcium: 9.4 mg/dL (ref 8.4–10.5)
GFR: 75.03 mL/min (ref >60.00)
Sodium: 140 mEq/L (ref 135–145)

## 2013-07-05 ENCOUNTER — Ambulatory Visit (INDEPENDENT_AMBULATORY_CARE_PROVIDER_SITE_OTHER): Payer: 59 | Admitting: Internal Medicine

## 2013-07-05 ENCOUNTER — Encounter: Payer: Self-pay | Admitting: Internal Medicine

## 2013-07-05 VITALS — BP 140/80 | HR 76 | Temp 98.6°F | Resp 16 | Ht 70.0 in | Wt 235.0 lb

## 2013-07-05 DIAGNOSIS — Z23 Encounter for immunization: Secondary | ICD-10-CM

## 2013-07-05 DIAGNOSIS — I10 Essential (primary) hypertension: Secondary | ICD-10-CM

## 2013-07-05 DIAGNOSIS — Z0000 Encounter for general adult medical examination without abnormal findings: Secondary | ICD-10-CM

## 2013-07-05 NOTE — Patient Instructions (Signed)
weight loss key

## 2013-07-05 NOTE — Progress Notes (Signed)
  Subjective:    Patient ID: Larry Williamson, male    DOB: [birth year], 62 y.o.   MRN: [id number]  HPI    Review of Systems     Objective:   Physical Exam        Assessment & Plan:

## 2013-07-05 NOTE — Progress Notes (Signed)
Subjective:    Patient ID: Larry Williamson, male    DOB: 04/11/1951, 62 y.o.   MRN: [id number]  HPI CPX    Review of Systems  Constitutional: Negative for fever and fatigue.  HENT: Negative for hearing loss, congestion, neck pain and postnasal drip.   Eyes: Negative for discharge, redness and visual disturbance.  Respiratory: Negative for cough, shortness of breath and wheezing.   Cardiovascular: Negative for leg swelling.  Gastrointestinal: Negative for abdominal pain, constipation and abdominal distention.  Genitourinary: Negative for urgency and frequency.  Musculoskeletal: Negative for joint swelling and arthralgias.  Skin: Negative for color change and rash.  Neurological: Negative for weakness and light-headedness.  Hematological: Negative for adenopathy.  Psychiatric/Behavioral: Negative for behavioral problems.       Past Medical History  Diagnosis Date  . GERD (gastroesophageal reflux disease)   . Hypertension   . Anemia   . Hyperlipidemia   . Diverticulitis of colon   . ALLERGIC RHINITIS   . Ulcer 1992    gastric  . Adenomatous colon polyp     History   Social History  . Marital Status: Married    Spouse Name: N/A    Number of Children: N/A  . Years of Education: N/A   Occupational History  . Not on file.   Social History Main Topics  . Smoking status: Former Smoker -- 1.00 packs/day for 15 years    Types: Cigarettes    Quit date: 10/21/1978  . Smokeless tobacco: Never Used  . Alcohol Use: No     Comment: Quit drinking 7 yrs ago  . Drug Use: No  . Sexual Activity: Yes   Other Topics Concern  . Not on file   Social History Narrative   Designated Party Release signed on 03/15/10    Past Surgical History  Procedure Laterality Date  . Foot surgery    . Colonoscopy w/ polypectomy      Family History  Problem Relation Age of Onset  . Cancer Father     lung  . Hemochromatosis Brother   . Dementia Mother   . Colon cancer Neg Hx      Allergies  Allergen Reactions  . Ace Inhibitors     REACTION: cough    Current Outpatient Prescriptions on File Prior to Visit  Medication Sig Dispense Refill  . aspirin 81 MG tablet Take 81 mg by mouth daily.        [name] atorvastatin (LIPITOR) 20 MG tablet take 1 tablet by mouth once daily  90 tablet  3  . BENICAR 40 MG tablet TAKE 1 TABLET BY MOUTH ONCE DAILY  90 each  3  . Folic Acid 200 MCG TABS Take 2 tablets by mouth daily.        . MULTIPLE VITAMIN PO Take by mouth daily.        . Omega-3 Fatty Acids (FISH OIL) 1000 MG CAPS Take 2 capsules by mouth 2 (two) times daily.        [name] omeprazole (PRILOSEC) 40 MG capsule take 1 capsule by mouth once daily  30 capsule  9  . tadalafil (CIALIS) 5 MG tablet Take 1 tablet (5 mg total) by mouth daily as needed for erectile dysfunction.  7 tablet  5   No current facility-administered medications on file prior to visit.    BP 140/80  Pulse 76  Temp(Src) 98.6 F (37 C)  Resp 16  Ht 5\' 10"  (1.778 m)  Wt 235 lb (106.595 kg)  BMI 33.72 kg/m2    Objective:   Physical Exam  Constitutional: He is oriented to person, place, and time. He appears well-developed and well-nourished.  obese  HENT:  Head: Normocephalic and atraumatic.  Eyes: Conjunctivae are normal. Pupils are equal, round, and reactive to light.  Neck: Normal range of motion. Neck supple.  Cardiovascular: Normal rate and regular rhythm.   Pulmonary/Chest: Effort normal and breath sounds normal.  Abdominal: Soft. Bowel sounds are normal.  Genitourinary: Rectum normal and prostate normal.  Musculoskeletal: Normal range of motion.  Neurological: He is alert and oriented to person, place, and time.  Skin: Skin is warm and dry.  Psychiatric: He has a normal mood and affect. His behavior is normal.          Assessment & Plan:   Patient presents for yearly preventative medicine examination.   all immunizations and health maintenance protocols were reviewed with the  patient and they are up to date with these protocols.   screening laboratory values were reviewed with the patient including screening of hyperlipidemia PSA renal function and hepatic function.   There medications past medical history social history problem list and allergies were reviewed in detail.   Goals were established with regard to weight loss exercise diet in compliance with medications Weight loss goals

## 2013-08-02 ENCOUNTER — Telehealth: Payer: Self-pay | Admitting: Internal Medicine

## 2013-08-02 MED ORDER — TADALAFIL 5 MG PO TABS: 5.0000 mg | ORAL_TABLET | Freq: Every day | ORAL | Status: DC

## 2013-08-02 NOTE — Telephone Encounter (Signed)
Pt wife called in and stated that the pt only received 7 pills per month of his tadalafil (CIALIS) 5 MG tablet refill. She states that it should be 30 per month, please assist.

## 2013-08-02 NOTE — Telephone Encounter (Signed)
Has been getting #30- sent in - ok per dr Lovell Sheehan

## 2013-08-12 ENCOUNTER — Other Ambulatory Visit: Payer: Self-pay | Admitting: Internal Medicine

## 2013-09-06 ENCOUNTER — Encounter: Payer: Self-pay | Admitting: Internal Medicine

## 2013-09-07 ENCOUNTER — Encounter: Payer: Self-pay | Admitting: Internal Medicine

## 2013-09-07 ENCOUNTER — Encounter: Payer: Self-pay | Admitting: Gastroenterology

## 2013-10-01 ENCOUNTER — Ambulatory Visit: Payer: 59 | Admitting: Gastroenterology

## 2013-10-26 ENCOUNTER — Other Ambulatory Visit: Payer: Self-pay | Admitting: Internal Medicine

## 2013-11-05 ENCOUNTER — Ambulatory Visit (INDEPENDENT_AMBULATORY_CARE_PROVIDER_SITE_OTHER): Payer: 59 | Admitting: Internal Medicine

## 2013-11-05 ENCOUNTER — Encounter: Payer: Self-pay | Admitting: Internal Medicine

## 2013-11-05 VITALS — BP 130/80 | HR 76 | Temp 98.3°F | Resp 16 | Ht 70.0 in | Wt 230.0 lb

## 2013-11-05 DIAGNOSIS — K769 Liver disease, unspecified: Secondary | ICD-10-CM

## 2013-11-05 DIAGNOSIS — R945 Abnormal results of liver function studies: Secondary | ICD-10-CM

## 2013-11-05 NOTE — Progress Notes (Signed)
Subjective:    Patient ID: Larry Williamson, male    DOB: September 03, 1951, 63 y.o.   MRN: [id number]  HPI Weight and blood pressure follow up Discussion of diet and exercise    Review of Systems  Constitutional: Negative for fever and fatigue.  HENT: Negative for congestion, hearing loss and postnasal drip.   Eyes: Negative for discharge, redness and visual disturbance.  Respiratory: Negative for cough, shortness of breath and wheezing.   Cardiovascular: Negative for leg swelling.  Gastrointestinal: Negative for abdominal pain, constipation and abdominal distention.  Genitourinary: Negative for urgency and frequency.  Musculoskeletal: Negative for arthralgias, joint swelling and neck pain.  Skin: Negative for color change and rash.  Neurological: Negative for weakness and light-headedness.  Hematological: Negative for adenopathy.  Psychiatric/Behavioral: Negative for behavioral problems.   Past Medical History  Diagnosis Date  . GERD (gastroesophageal reflux disease)   . Hypertension   . Anemia   . Hyperlipidemia   . Diverticulitis of colon   . ALLERGIC RHINITIS   . Ulcer 1992    gastric  . Adenomatous colon polyp 06/2006    History   Social History  . Marital Status: Married    Spouse Name: N/A    Number of Children: N/A  . Years of Education: N/A   Occupational History  . Not on file.   Social History Main Topics  . Smoking status: Former Smoker -- 1.00 packs/day for 15 years    Types: Cigarettes    Quit date: 10/21/1978  . Smokeless tobacco: Never Used  . Alcohol Use: No     Comment: Quit drinking 7 yrs ago  . Drug Use: No  . Sexual Activity: Yes   Other Topics Concern  . Not on file   Social History Narrative   Designated Party Release signed on 03/15/10    Past Surgical History  Procedure Laterality Date  . Foot surgery    . Colonoscopy w/ polypectomy      Family History  Problem Relation Age of Onset  . Cancer Father     lung  .  Hemochromatosis Brother   . Dementia Mother   . Colon cancer Neg Hx     Allergies  Allergen Reactions  . Ace Inhibitors     REACTION: cough    Current Outpatient Prescriptions on File Prior to Visit  Medication Sig Dispense Refill  . aspirin 81 MG tablet Take 81 mg by mouth daily.        [name] atorvastatin (LIPITOR) 20 MG tablet take 1 tablet by mouth once daily  90 tablet  3  . BENICAR 40 MG tablet take 1 tablet by mouth once daily  90 tablet  3  . Folic Acid 073 MCG TABS Take 2 tablets by mouth daily.        . MULTIPLE VITAMIN PO Take by mouth daily.        . Omega-3 Fatty Acids (FISH OIL) 1000 MG CAPS Take 2 capsules by mouth 2 (two) times daily.        [name] omeprazole (PRILOSEC) 40 MG capsule take 1 capsule by mouth once daily  30 capsule  9  . tadalafil (CIALIS) 5 MG tablet Take 1 tablet (5 mg total) by mouth daily.  30 tablet  5   No current facility-administered medications on file prior to visit.    BP 130/80  Pulse 76  Temp(Src) 98.3 F (36.8 C)  Resp 16  Ht 5\' 10"  (1.778 m)  Wt 230 lb (  104.327 kg)  BMI 33.00 kg/m2       Objective:   Physical Exam  Nursing note and vitals reviewed. Constitutional: He appears well-developed and well-nourished.  HENT:  Head: Normocephalic and atraumatic.  Eyes: Conjunctivae are normal. Pupils are equal, round, and reactive to light.  Neck: Normal range of motion. Neck supple.  Cardiovascular: Normal rate and regular rhythm.   Pulmonary/Chest: Effort normal and breath sounds normal.  Abdominal: Soft. Bowel sounds are normal.          Assessment & Plan:  HTN stable  Elevations of liver functions with presumptive dx of fatty liver weigth loss discussed and will recheck today

## 2013-11-05 NOTE — Addendum Note (Signed)
Addended by: Joyce Gross R on: 11/05/2013 04:29 PM   Modules accepted: Orders

## 2013-11-06 LAB — HEPATIC FUNCTION PANEL
ALK PHOS: 37 U/L — AB (ref 39–117)
ALT: 41 U/L (ref 0–53)
AST: 28 U/L (ref 0–37)
Albumin: 4.3 g/dL (ref 3.5–5.2)
BILIRUBIN INDIRECT: 0.7 mg/dL (ref 0.0–0.9)
Bilirubin, Direct: 0.2 mg/dL (ref 0.0–0.3)
TOTAL PROTEIN: 7.1 g/dL (ref 6.0–8.3)
Total Bilirubin: 0.9 mg/dL (ref 0.3–1.2)

## 2014-02-23 ENCOUNTER — Other Ambulatory Visit: Payer: Self-pay | Admitting: Dermatology

## 2014-02-23 DIAGNOSIS — C439 Malignant melanoma of skin, unspecified: Secondary | ICD-10-CM

## 2014-02-23 HISTORY — DX: Malignant melanoma of skin, unspecified: C43.9

## 2014-03-01 ENCOUNTER — Other Ambulatory Visit: Payer: Self-pay | Admitting: Internal Medicine

## 2014-04-18 ENCOUNTER — Ambulatory Visit (INDEPENDENT_AMBULATORY_CARE_PROVIDER_SITE_OTHER): Payer: 59 | Admitting: Physician Assistant

## 2014-04-18 ENCOUNTER — Encounter: Payer: Self-pay | Admitting: Physician Assistant

## 2014-04-18 VITALS — BP 124/68 | HR 72 | Temp 97.9°F | Resp 18 | Wt 235.0 lb

## 2014-04-18 DIAGNOSIS — M79605 Pain in left leg: Principal | ICD-10-CM

## 2014-04-18 DIAGNOSIS — M79604 Pain in right leg: Secondary | ICD-10-CM

## 2014-04-18 DIAGNOSIS — M79609 Pain in unspecified limb: Secondary | ICD-10-CM

## 2014-04-18 LAB — BASIC METABOLIC PANEL
BUN: 19 mg/dL (ref 6–23)
CALCIUM: 8.7 mg/dL (ref 8.4–10.5)
CO2: 26 meq/L (ref 19–32)
Chloride: 106 mEq/L (ref 96–112)
Creatinine, Ser: 1.2 mg/dL (ref 0.4–1.5)
GFR: 62.44 mL/min (ref >60.00)
GLUCOSE: 140 mg/dL — AB (ref 70–99)
POTASSIUM: 4 meq/L (ref 3.5–5.1)
SODIUM: 140 meq/L (ref 135–145)

## 2014-04-18 LAB — MAGNESIUM: Magnesium: 2 mg/dL (ref 1.5–2.5)

## 2014-04-18 LAB — CK: Total CK: 370 U/L — ABNORMAL HIGH (ref 7–232)

## 2014-04-18 NOTE — Progress Notes (Signed)
Pre visit review using our clinic review tool, if applicable. No additional management support is needed unless otherwise documented below in the visit note. 

## 2014-04-18 NOTE — Progress Notes (Signed)
Subjective:    Patient ID: Larry Williamson, male    DOB: Mar 25, 1951, 63 y.o.   MRN: [id number]  Leg Pain  There was no injury mechanism. The pain is present in the left thigh and right thigh. The quality of the pain is described as stabbing and burning. The pain is at a severity of 10/10. The pain is severe. The pain has been intermittent since onset. Associated symptoms include numbness. Pertinent negatives include no inability to bear weight, loss of motion, loss of sensation, muscle weakness or tingling. He reports no foreign bodies present. The symptoms are aggravated by movement. He has tried rest for the symptoms. The treatment provided significant relief.  Pt states that this problem has been gradually worsening over the course of a few years. Pt denies fevers, chills, nausea, vomiting, diarrhea, shortness of breath, chest pain.    Review of Systems  Neurological: Positive for numbness. Negative for tingling.   as per history of present illness and are otherwise negative.   Past Medical History  Diagnosis Date  . GERD (gastroesophageal reflux disease)   . Hypertension   . Anemia   . Hyperlipidemia   . Diverticulitis of colon   . ALLERGIC RHINITIS   . Ulcer 1992    gastric  . Adenomatous colon polyp 06/2006    History   Social History  . Marital Status: Married    Spouse Name: N/A    Number of Children: N/A  . Years of Education: N/A   Occupational History  . Not on file.   Social History Main Topics  . Smoking status: Former Smoker -- 1.00 packs/day for 15 years    Types: Cigarettes    Quit date: 10/21/1978  . Smokeless tobacco: Never Used  . Alcohol Use: No     Comment: Quit drinking 7 yrs ago  . Drug Use: No  . Sexual Activity: Yes   Other Topics Concern  . Not on file   Social History Narrative   Designated Party Release signed on 03/15/10    Past Surgical History  Procedure Laterality Date  . Foot surgery    . Colonoscopy w/ polypectomy        Family History  Problem Relation Age of Onset  . Cancer Father     lung  . Hemochromatosis Brother   . Dementia Mother   . Colon cancer Neg Hx     Allergies  Allergen Reactions  . Ace Inhibitors     REACTION: cough    Current Outpatient Prescriptions on File Prior to Visit  Medication Sig Dispense Refill  . aspirin 81 MG tablet Take 81 mg by mouth daily.        [name] atorvastatin (LIPITOR) 20 MG tablet take 1 tablet by mouth once daily  90 tablet  3  . BENICAR 40 MG tablet take 1 tablet by mouth once daily  90 tablet  3  . Folic Acid 408 MCG TABS Take 2 tablets by mouth daily.        . MULTIPLE VITAMIN PO Take by mouth daily.        . Omega-3 Fatty Acids (FISH OIL) 1000 MG CAPS Take 2 capsules by mouth 2 (two) times daily.        [name] omeprazole (PRILOSEC) 40 MG capsule take 1 capsule by mouth once daily  30 capsule  9  . tadalafil (CIALIS) 5 MG tablet Take 1 tablet (5 mg total) by mouth daily.  30 tablet  5  No current facility-administered medications on file prior to visit.    EXAM: BP 124/68  Pulse 72  Temp(Src) 97.9 F (36.6 C) (Oral)  Resp 18  Wt 235 lb (106.595 kg)      Objective:   Physical Exam  Nursing note and vitals reviewed. Constitutional: He is oriented to person, place, and time. He appears well-developed and well-nourished. No distress.  HENT:  Head: Normocephalic and atraumatic.  Eyes: Conjunctivae and EOM are normal. Pupils are equal, round, and reactive to light.  Neck: Normal range of motion.  Cardiovascular: Normal rate, regular rhythm and intact distal pulses.   Pulmonary/Chest: Effort normal and breath sounds normal. No respiratory distress. He exhibits no tenderness.  Musculoskeletal: Normal range of motion. He exhibits no edema and no tenderness.  Neurological: He is alert and oriented to person, place, and time. He has normal reflexes. He displays normal reflexes. No cranial nerve deficit. He exhibits normal muscle tone. Coordination  normal.  Negative SLR.  Skin: Skin is warm and dry. No rash noted. He is not diaphoretic. No erythema. No pallor.  Psychiatric: He has a normal mood and affect. His behavior is normal. Judgment and thought content normal.    Lab Results  Component Value Date   WBC 6.7 06/28/2013   HGB 14.9 06/28/2013   HCT 43.4 06/28/2013   PLT 184.0 06/28/2013   GLUCOSE 96 06/28/2013   CHOL 164 06/28/2013   TRIG 173.0* 06/28/2013   HDL 36.70* 06/28/2013   LDLDIRECT 85.0 12/21/2012   LDLCALC 93 06/28/2013   ALT 41 11/05/2013   AST 28 11/05/2013   NA 140 06/28/2013   K 5.1 06/28/2013   CL 104 06/28/2013   CREATININE 1.1 06/28/2013   BUN 20 06/28/2013   CO2 28 06/28/2013   TSH 2.72 06/28/2013   PSA 1.14 06/28/2013   HGBA1C 5.4 03/15/2010         Assessment & Plan:  Larry Williamson was seen today for leg pain.  Diagnoses and associated orders for this visit:  Leg pain, bilateral Comments: Progressively worsened over years. Good pulses, intact sensation. Will obtain BMP, Mag, cpk, and MRI lumbar spine. - Magnesium - CK - Basic Metabolic Panel - MR Lumbar Spine Wo Contrast; Future   Pt will monitor symptoms and try magnesium and calcium supplement to see if they offer relief of cramping type symptoms.  Pt will scheduled appointment to establish with Larry Williamson within the next 3 months.  Return precautions provided, and patient handout on neuropathic pain.  Plan to follow up after MRI to go over results and to reassess symptoms, or for worsening or persistent symptoms despite treatment.  Patient Instructions  We will call you with the results your lab work when they are available.  You'll be called to schedule an MRI of your low back to assess possible causes of your leg pain and numbness.  You can start taking a calcium and magnesium supplement to see if these help your pain.  Schedule an appointment to establish with Larry Williamson within the next 3 months.  If emergency symptoms discussed during visit developed, seek medical  attention immediately.  Followup after MRI to go over results and reassess symptoms, or for worsening or persistent symptoms despite treatment.

## 2014-04-18 NOTE — Patient Instructions (Signed)
We will call you with the results your lab work when they are available.  You'll be called to schedule an MRI of your low back to assess possible causes of your leg pain and numbness.  You can start taking a calcium and magnesium supplement to see if these help your pain.  Schedule an appointment to establish with Dr. Yong Channel within the next 3 months.  If emergency symptoms discussed during visit developed, seek medical attention immediately.  Followup after MRI to go over results and reassess symptoms, or for worsening or persistent symptoms despite treatment.      Neuropathic Pain We often think that pain has a physical cause. If we get rid of the cause, the pain should go away. Nerves themselves can also cause pain. It is called neuropathic pain, which means nerve abnormality. It may be difficult for the patients who have it and for the treating caregivers. Pain is usually described as acute (short-lived) or chronic (long-lasting). Acute pain is related to the physical sensations caused by an injury. It can last from a few seconds to many weeks, but it usually goes away when normal healing occurs. Chronic pain lasts beyond the typical healing time. With neuropathic pain, the nerve fibers themselves may be damaged or injured. They then send incorrect signals to other pain centers. The pain you feel is real, but the cause is not easy to find.  CAUSES  Chronic pain can result from diseases, such as diabetes and shingles (an infection related to chickenpox), or from trauma, surgery, or amputation. It can also happen without any known injury or disease. The nerves are sending pain messages, even though there is no identifiable cause for such messages.   Other common causes of neuropathy include diabetes, phantom limb pain, or Regional Pain Syndrome (RPS).  As with all forms of chronic back pain, if neuropathy is not correctly treated, there can be a number of associated problems that lead to a  downward cycle for the patient. These include depression, sleeplessness, feelings of fear and anxiety, limited social interaction and inability to do normal daily activities or work.  The most dramatic and mysterious example of neuropathic pain is called "phantom limb syndrome." This occurs when an arm or a leg has been removed because of illness or injury. The brain still gets pain messages from the nerves that originally carried impulses from the missing limb. These nerves now seem to misfire and cause troubling pain.  Neuropathic pain often seems to have no cause. It responds poorly to standard pain treatment. Neuropathic pain can occur after:  Shingles (herpes zoster virus infection).  A lasting burning sensation of the skin, caused usually by injury to a peripheral nerve.  Peripheral neuropathy which is widespread nerve damage, often caused by diabetes or alcoholism.  Phantom limb pain following an amputation.  Facial nerve problems (trigeminal neuralgia).  Multiple sclerosis.  Reflex sympathetic dystrophy.  Pain which comes with cancer and cancer chemotherapy.  Entrapment neuropathy such as when pressure is put on a nerve such as in carpal tunnel syndrome.  Back, leg, and hip problems (sciatica).  Spine or back surgery.  HIV Infection or AIDS where nerves are infected by viruses. Your caregiver can explain items in the above list which may apply to you. SYMPTOMS  Characteristics of neuropathic pain are:  Severe, sharp, electric shock-like, shooting, lightening-like, knife-like.  Pins and needles sensation.  Deep burning, deep cold, or deep ache.  Persistent numbness, tingling, or weakness.  Pain resulting from light  touch or other stimulus that would not usually cause pain.  Increased sensitivity to something that would normally cause pain, such as a pinprick. Pain may persist for months or years following the healing of damaged tissues. When this happens, pain  signals no longer sound an alarm about current injuries or injuries about to happen. Instead, the alarm system itself is not working correctly.  Neuropathic pain may get worse instead of better over time. For some people, it can lead to serious disability. It is important to be aware that severe injury in a limb can occur without a proper, protective pain response.Burns, cuts, and other injuries may go unnoticed. Without proper treatment, these injuries can become infected or lead to further disability. Take any injury seriously, and consult your caregiver for treatment. DIAGNOSIS  When you have a pain with no known cause, your caregiver will probably ask some specific questions:   Do you have any other conditions, such as diabetes, shingles, multiple sclerosis, or HIV infection?  How would you describe your pain? (Neuropathic pain is often described as shooting, stabbing, burning, or searing.)  Is your pain worse at any time of the day? (Neuropathic pain is usually worse at night.)  Does the pain seem to follow a certain physical pathway?  Does the pain come from an area that has missing or injured nerves? (An example would be phantom limb pain.)  Is the pain triggered by minor things such as rubbing against the sheets at night? These questions often help define the type of pain involved. Once your caregiver knows what is happening, treatment can begin. Anticonvulsant, antidepressant drugs, and various pain relievers seem to work in some cases. If another condition, such as diabetes is involved, better management of that disorder may relieve the neuropathic pain.  TREATMENT  Neuropathic pain is frequently long-lasting and tends not to respond to treatment with narcotic type pain medication. It may respond well to other drugs such as antiseizure and antidepressant medications. Usually, neuropathic problems do not completely go away, but partial improvement is often possible with proper treatment.  Your caregivers have large numbers of medications available to treat you. Do not be discouraged if you do not get immediate relief. Sometimes different medications or a combination of medications will be tried before you receive the results you are hoping for. See your caregiver if you have pain that seems to be coming from nowhere and does not go away. Help is available.  SEEK IMMEDIATE MEDICAL CARE IF:   There is a sudden change in the quality of your pain, especially if the change is on only one side of the body.  You notice changes of the skin, such as redness, black or purple discoloration, swelling, or an ulcer.  You cannot move the affected limbs. Document Released: 07/04/2004 Document Revised: 12/30/2011 Document Reviewed: 07/04/2004 [facility] Patient Information 2015 [place], [place]. This information is not intended to replace advice given to you by your health care Adithi Gammon. Make sure you discuss any questions you have with your health care Anaiyah Anglemyer.

## 2014-04-20 ENCOUNTER — Ambulatory Visit
Admission: RE | Admit: 2014-04-20 | Discharge: 2014-04-20 | Disposition: A | Discharge status: Home / Self Care | Payer: 59 | Source: Ambulatory Visit | Attending: Physician Assistant | Admitting: Physician Assistant

## 2014-04-20 ENCOUNTER — Telehealth: Payer: Self-pay | Admitting: Physician Assistant

## 2014-04-20 DIAGNOSIS — M79604 Pain in right leg: Secondary | ICD-10-CM

## 2014-04-20 DIAGNOSIS — M79605 Pain in left leg: Principal | ICD-10-CM

## 2014-04-20 DIAGNOSIS — R252 Cramp and spasm: Secondary | ICD-10-CM

## 2014-04-20 NOTE — Telephone Encounter (Signed)
Patient called to the liver lab results. Patient had an elevated CK. Advised patient that this could be related to his Lipitor. Patient will stop the Lipitor, and we will recheck CK in a few weeks to reassess. Next week patient will come in fasting and have additional lab work to identify possible causes of cramps. Patient is amenable to all of this.

## 2014-04-21 ENCOUNTER — Telehealth: Payer: Self-pay | Admitting: Physician Assistant

## 2014-04-21 DIAGNOSIS — M79604 Pain in right leg: Secondary | ICD-10-CM

## 2014-04-21 DIAGNOSIS — M79605 Pain in left leg: Principal | ICD-10-CM

## 2014-04-21 NOTE — Telephone Encounter (Signed)
Left a message for pt to return call 

## 2014-04-21 NOTE — Telephone Encounter (Signed)
Left HIPAA compliant message for pt to return call.

## 2014-04-25 ENCOUNTER — Other Ambulatory Visit: Payer: 59

## 2014-04-25 NOTE — Telephone Encounter (Signed)
Left a message for return call.  

## 2014-04-26 ENCOUNTER — Other Ambulatory Visit (INDEPENDENT_AMBULATORY_CARE_PROVIDER_SITE_OTHER): Payer: 59

## 2014-04-26 DIAGNOSIS — R252 Cramp and spasm: Secondary | ICD-10-CM

## 2014-04-26 NOTE — Telephone Encounter (Signed)
Pt returned call; pt is aware of results.  

## 2014-04-28 LAB — ALDOLASE: Aldolase: 6.4 U/L (ref <=8.1)

## 2014-05-02 ENCOUNTER — Other Ambulatory Visit: Payer: Self-pay

## 2014-05-02 DIAGNOSIS — R252 Cramp and spasm: Secondary | ICD-10-CM

## 2014-05-02 LAB — LIPID PANEL
Cholesterol: 193 mg/dL (ref 0–200)
HDL: 35.4 mg/dL — ABNORMAL LOW (ref >39.00)
LDL Cholesterol: 120 mg/dL — ABNORMAL HIGH (ref 0–99)
NonHDL: 157.6
TRIGLYCERIDES: 186 mg/dL — AB (ref 0.0–149.0)
Total CHOL/HDL Ratio: 5
VLDL: 37.2 mg/dL (ref 0.0–40.0)

## 2014-05-02 LAB — CK: Total CK: 228 U/L (ref 7–232)

## 2014-05-02 LAB — SEDIMENTATION RATE: Sed Rate: 17 mm/hr (ref 0–22)

## 2014-05-03 LAB — JO-1 ANTIBODY-IGG: JO-1 ANTIBODY, IGG: NEGATIVE

## 2014-05-05 ENCOUNTER — Other Ambulatory Visit: Payer: Self-pay | Admitting: Dermatology

## 2014-05-16 ENCOUNTER — Telehealth: Payer: Self-pay | Admitting: Physician Assistant

## 2014-05-16 ENCOUNTER — Encounter (INDEPENDENT_AMBULATORY_CARE_PROVIDER_SITE_OTHER): Payer: Self-pay | Admitting: Radiology

## 2014-05-16 ENCOUNTER — Ambulatory Visit (INDEPENDENT_AMBULATORY_CARE_PROVIDER_SITE_OTHER): Payer: 59 | Admitting: Neurology

## 2014-05-16 DIAGNOSIS — M79604 Pain in right leg: Secondary | ICD-10-CM

## 2014-05-16 DIAGNOSIS — M792 Neuralgia and neuritis, unspecified: Secondary | ICD-10-CM

## 2014-05-16 DIAGNOSIS — M79605 Pain in left leg: Principal | ICD-10-CM

## 2014-05-16 DIAGNOSIS — G5710 Meralgia paresthetica, unspecified lower limb: Secondary | ICD-10-CM

## 2014-05-16 DIAGNOSIS — Z0289 Encounter for other administrative examinations: Secondary | ICD-10-CM

## 2014-05-16 MED ORDER — GABAPENTIN 100 MG PO CAPS: ORAL_CAPSULE | ORAL | Status: DC

## 2014-05-16 NOTE — Procedures (Signed)
     HISTORY:  Larry Williamson is a 63 year old gentleman with a several year history of right greater than left thigh numbness and discomfort. He reports no weakness of the lower extremities, but he does have some low back pain. The sensory alteration in pain is in the anterolateral aspects of the thighs bilaterally. The patient is being evaluated for possible neuropathy or a lumbosacral radiculopathy.  NERVE CONDUCTION STUDIES:  Nerve conduction studies were performed on both lower extremities. The distal motor latencies and motor amplitudes for the peroneal and posterior tibial nerves were within normal limits. The nerve conduction velocities for these nerves were also normal. The F wave latencies for the peroneal nerves bilaterally and for the right posterior tibial nerve normal. The F wave latency for the left posterior tibial nerve was slightly prolonged. The sensory latencies for the peroneal nerves were within normal limit on the right, slightly prolonged on the left. The H reflex latencies were prolonged bilaterally.   EMG STUDIES:  EMG study was performed on the right lower extremity:  The tibialis anterior muscle reveals 2 to 4K motor units with full recruitment. No fibrillations or positive waves were seen. The peroneus tertius muscle reveals 2 to 4K motor units with full recruitment. No fibrillations or positive waves were seen. The medial gastrocnemius muscle reveals 1 to 3K motor units with full recruitment. No fibrillations or positive waves were seen. The vastus lateralis muscle reveals 2 to 4K motor units with full recruitment. No fibrillations or positive waves were seen. The iliopsoas muscle reveals 2 to 4K motor units with full recruitment. No fibrillations or positive waves were seen. The biceps femoris muscle (long head) reveals 2 to 4K motor units with full recruitment. No fibrillations or positive waves were seen. The lumbosacral paraspinal muscles were tested at 3  levels, and revealed no abnormalities of insertional activity at all 3 levels tested. There was good relaxation.  EMG study was performed on the left lower extremity:  The tibialis anterior muscle reveals 2 to 4K motor units with full recruitment. No fibrillations or positive waves were seen. The peroneus tertius muscle reveals 2 to 4K motor units with full recruitment. No fibrillations or positive waves were seen. The medial gastrocnemius muscle reveals 1 to 3K motor units with full recruitment. No fibrillations or positive waves were seen. The vastus lateralis muscle reveals 2 to 4K motor units with full recruitment. No fibrillations or positive waves were seen. The iliopsoas muscle reveals 2 to 4K motor units with full recruitment. No fibrillations or positive waves were seen. The biceps femoris muscle (long head) reveals 2 to 4K motor units with full recruitment. No fibrillations or positive waves were seen. The lumbosacral paraspinal muscles were tested at 3 levels, and revealed no abnormalities of insertional activity at all 3 levels tested. There was good relaxation.   IMPRESSION:  Nerve conduction studies done on both lower extremities were essentially within normal limits, with exception of some mild sensory delay of the left peroneal nerve. No clear evidence of a peripheral neuropathy is seen. EMG evaluation of both lower extremities is unremarkable, without evidence of an overlying lumbosacral radiculopathy on either side. The distribution of sensory alteration and pain is consistent with bilateral meralgia paresthetica associated with bilateral lateral femoral cutaneous neuropathies.  Jill Alexanders MD 05/16/2014 1:43 PM  [facility] [address] [place], [place] [address]  Phone [phone] Fax [phone]

## 2014-05-16 NOTE — Telephone Encounter (Signed)
Called patient to relay nerve conduction study test results. Patient had discontinued Lipitor in an effort to relieve muscle/thigh pain. This did not relieve symptoms, and so the patient will restart taking Lipitor. Nerve conduction test demonstrated probable neuralgia paresthetica. Patient amenable to a trial of gabapentin: Starting dose of 1 pill each bedtime for 7 days, then 1 pill at dinnertime and one pill each bedtime. Followup in one month to reassess.

## 2014-05-17 ENCOUNTER — Telehealth: Payer: Self-pay | Admitting: Physician Assistant

## 2014-05-17 DIAGNOSIS — M792 Neuralgia and neuritis, unspecified: Secondary | ICD-10-CM

## 2014-05-17 MED ORDER — GABAPENTIN 100 MG PO CAPS: ORAL_CAPSULE | ORAL | Status: DC

## 2014-05-17 NOTE — Telephone Encounter (Signed)
Rx resent to [facility] on [address].  Called and spoke with spouse and she is aware rx sent to correct pharmacy.

## 2014-05-17 NOTE — Telephone Encounter (Signed)
Please call gabapentin into [facility] [phone] rx was sent to wrong location

## 2014-06-14 ENCOUNTER — Encounter: Payer: Self-pay | Admitting: Physician Assistant

## 2014-06-14 ENCOUNTER — Ambulatory Visit (INDEPENDENT_AMBULATORY_CARE_PROVIDER_SITE_OTHER): Payer: 59 | Admitting: Physician Assistant

## 2014-06-14 VITALS — BP 110/68 | HR 72 | Temp 98.4°F | Resp 18 | Wt 235.0 lb

## 2014-06-14 DIAGNOSIS — G5710 Meralgia paresthetica, unspecified lower limb: Secondary | ICD-10-CM

## 2014-06-14 MED ORDER — GABAPENTIN 100 MG PO CAPS: 100.0000 mg | ORAL_CAPSULE | Freq: Two times a day (BID) | ORAL | Status: DC

## 2014-06-14 NOTE — Patient Instructions (Addendum)
Continue gabapentin twice daily for the pain for now. If you feel like you want to take more, call the office and we can increase her prescription to 3 times daily if needed.  Schedule an annual physical exam prior to leaving today.  If emergency symptoms discussed during visit developed, seek medical attention immediately.  Followup as needed, or for worsening or persistent symptoms despite treatment.     Gabapentin capsules or tablets What is this medicine? GABAPENTIN (GA ba pen tin) is used to control partial seizures in adults with epilepsy. It is also used to treat certain types of nerve pain. This medicine may be used for other purposes; ask your health care provider or pharmacist if you have questions. COMMON BRAND NAME(S): Orpha Bur, Neurontin What should I tell my health care provider before I take this medicine? They need to know if you have any of these conditions: -kidney disease -suicidal thoughts, plans, or attempt; a previous suicide attempt by you or a family member -an unusual or allergic reaction to gabapentin, other medicines, foods, dyes, or preservatives -pregnant or trying to get pregnant -breast-feeding How should I use this medicine? Take this medicine by mouth with a glass of water. Follow the directions on the prescription label. You can take it with or without food. If it upsets your stomach, take it with food.Take your medicine at regular intervals. Do not take it more often than directed. Do not stop taking except on your doctor's advice. If you are directed to break the 600 or 800 mg tablets in half as part of your dose, the extra half tablet should be used for the next dose. If you have not used the extra half tablet within 28 days, it should be thrown away. A special MedGuide will be given to you by the pharmacist with each prescription and refill. Be sure to read this information carefully each time. Talk to your pediatrician regarding the use of this  medicine in children. Special care may be needed. Overdosage: If you think you have taken too much of this medicine contact a poison control center or emergency room at once. NOTE: This medicine is only for you. Do not share this medicine with others. What if I miss a dose? If you miss a dose, take it as soon as you can. If it is almost time for your next dose, take only that dose. Do not take double or extra doses. What may interact with this medicine? Do not take this medicine with any of the following medications: -other gabapentin products This medicine may also interact with the following medications: -alcohol -antacids -antihistamines for allergy, cough and cold -certain medicines for anxiety or sleep -certain medicines for depression or psychotic disturbances -homatropine; hydrocodone -naproxen -narcotic medicines (opiates) for pain -phenothiazines like chlorpromazine, mesoridazine, prochlorperazine, thioridazine This list may not describe all possible interactions. Give your health care provider a list of all the medicines, herbs, non-prescription drugs, or dietary supplements you use. Also tell them if you smoke, drink alcohol, or use illegal drugs. Some items may interact with your medicine. What should I watch for while using this medicine? Visit your doctor or health care professional for regular checks on your progress. You may want to keep a record at home of how you feel your condition is responding to treatment. You may want to share this information with your doctor or health care professional at each visit. You should contact your doctor or health care professional if your seizures get worse or if  you have any new types of seizures. Do not stop taking this medicine or any of your seizure medicines unless instructed by your doctor or health care professional. Stopping your medicine suddenly can increase your seizures or their severity. Wear a medical identification bracelet or  chain if you are taking this medicine for seizures, and carry a card that lists all your medications. You may get drowsy, dizzy, or have blurred vision. Do not drive, use machinery, or do anything that needs mental alertness until you know how this medicine affects you. To reduce dizzy or fainting spells, do not sit or stand up quickly, especially if you are an older patient. Alcohol can increase drowsiness and dizziness. Avoid alcoholic drinks. Your mouth may get dry. Chewing sugarless gum or sucking hard candy, and drinking plenty of water will help. The use of this medicine may increase the chance of suicidal thoughts or actions. Pay special attention to how you are responding while on this medicine. Any worsening of mood, or thoughts of suicide or dying should be reported to your health care professional right away. Women who become pregnant while using this medicine may enroll in the [place] Pregnancy Registry by calling [phone]. This registry collects information about the safety of antiepileptic drug use during pregnancy. What side effects may I notice from receiving this medicine? Side effects that you should report to your doctor or health care professional as soon as possible: -allergic reactions like skin rash, itching or hives, swelling of the face, lips, or tongue -worsening of mood, thoughts or actions of suicide or dying Side effects that usually do not require medical attention (report to your doctor or health care professional if they continue or are bothersome): -constipation -difficulty walking or controlling muscle movements -dizziness -nausea -slurred speech -tiredness -tremors -weight gain This list may not describe all possible side effects. Call your doctor for medical advice about side effects. You may report side effects to FDA at 1-800-FDA-1088. Where should I keep my medicine? Keep out of reach of children. Store at room temperature  between 15 and 30 degrees C (59 and 86 degrees F). Throw away any unused medicine after the expiration date. NOTE: This sheet is a summary. It may not cover all possible information. If you have questions about this medicine, talk to your doctor, pharmacist, or health care provider.  2015, Elsevier/Gold Standard. (2013-06-10 09:12:48)

## 2014-06-14 NOTE — Progress Notes (Signed)
Pre visit review using our clinic review tool, if applicable. No additional management support is needed unless otherwise documented below in the visit note.,  

## 2014-06-14 NOTE — Progress Notes (Signed)
Subjective:    Patient ID: Larry Williamson, male    DOB: 01-07-1951, 63 y.o.   MRN: [id number]  HPI Patient is a 63 y.o. male presenting for follow up. Pt was seen several weeks ago for bilateral leg/thigh pain that felt like cramping and numbness. A Nerve conduction study showed meralgia paresthetica.  Pt was started on a trial of gabapentin at night to help the pain he was feeling. The pt states that this pain is better, he still experiences the numbness, however the sharp pains are no longer present. He states he is tolerating the gabapentin very well, and denies any adverse effects, and no drowsiness. He has also been using a topical lineament, which he reports has really been helping also. Patient denies fevers, chills, nausea, vomiting, diarrhea, shortness of breath, chest pain, headache, syncope.   Review of Systems As per HPI and are otherwise negative.   Past Medical History  Diagnosis Date  . GERD (gastroesophageal reflux disease)   . Hypertension   . Anemia   . Hyperlipidemia   . Diverticulitis of colon   . ALLERGIC RHINITIS   . Ulcer 1992    gastric  . Adenomatous colon polyp 06/2006    History   Social History  . Marital Status: Married    Spouse Name: N/A    Number of Children: N/A  . Years of Education: N/A   Occupational History  . Not on file.   Social History Main Topics  . Smoking status: Former Smoker -- 1.00 packs/day for 15 years    Types: Cigarettes    Quit date: 10/21/1978  . Smokeless tobacco: Never Used  . Alcohol Use: No     Comment: Quit drinking 7 yrs ago  . Drug Use: No  . Sexual Activity: Yes   Other Topics Concern  . Not on file   Social History Narrative   [employer] Release signed on 03/15/10    Past Surgical History  Procedure Laterality Date  . Foot surgery    . Colonoscopy w/ polypectomy      Family History  Problem Relation Age of Onset  . Cancer Father     lung  . Hemochromatosis Brother   . Dementia  Mother   . Colon cancer Neg Hx     Allergies  Allergen Reactions  . Ace Inhibitors     REACTION: cough    Current Outpatient Prescriptions on File Prior to Visit  Medication Sig Dispense Refill  . aspirin 81 MG tablet Take 81 mg by mouth daily.        [name] atorvastatin (LIPITOR) 20 MG tablet take 1 tablet by mouth once daily  90 tablet  3  . BENICAR 40 MG tablet take 1 tablet by mouth once daily  90 tablet  3  . Folic Acid 371 MCG TABS Take 2 tablets by mouth daily.        . MULTIPLE VITAMIN PO Take by mouth daily.        . Omega-3 Fatty Acids (FISH OIL) 1000 MG CAPS Take 2 capsules by mouth 2 (two) times daily.        [name] omeprazole (PRILOSEC) 40 MG capsule take 1 capsule by mouth once daily  30 capsule  9  . tadalafil (CIALIS) 5 MG tablet Take 1 tablet (5 mg total) by mouth daily.  30 tablet  5   No current facility-administered medications on file prior to visit.    EXAM: BP 110/68  Pulse 72  Temp(Src) 98.4 F (36.9 C) (Oral)  Resp 18  Wt 235 lb (106.595 kg)     Objective:   Physical Exam  Nursing note and vitals reviewed. Constitutional: He is oriented to person, place, and time. He appears well-developed and well-nourished. No distress.  HENT:  Head: Normocephalic and atraumatic.  Eyes: Conjunctivae and EOM are normal. Pupils are equal, round, and reactive to light.  Cardiovascular: Normal rate, regular rhythm and intact distal pulses.   Pulmonary/Chest: Effort normal and breath sounds normal. No respiratory distress. He exhibits no tenderness.  Musculoskeletal: Normal range of motion. He exhibits no edema and no tenderness.  Gait- normal.  Neurological: He is alert and oriented to person, place, and time.  Strength, sensation, and Reflexes all grossly intact bilaterally.  Skin: Skin is warm and dry. He is not diaphoretic.  Psychiatric: He has a normal mood and affect. His behavior is normal. Judgment and thought content normal.    Lab Results  Component Value Date     WBC 6.7 06/28/2013   HGB 14.9 06/28/2013   HCT 43.4 06/28/2013   PLT 184.0 06/28/2013   GLUCOSE 140* 04/18/2014   CHOL 193 05/02/2014   TRIG 186.0* 05/02/2014   HDL 35.40* 05/02/2014   LDLDIRECT 85.0 12/21/2012   LDLCALC 120* 05/02/2014   ALT 41 11/05/2013   AST 28 11/05/2013   NA 140 04/18/2014   K 4.0 04/18/2014   CL 106 04/18/2014   CREATININE 1.2 04/18/2014   BUN 19 04/18/2014   CO2 26 04/18/2014   TSH 2.72 06/28/2013   PSA 1.14 06/28/2013   HGBA1C 5.4 03/15/2010         Assessment & Plan:  Larry Williamson was seen today for follow-up.  Diagnoses and associated orders for this visit:  Meralgia paraesthetica, unspecified laterality Comments: Pt tolerating gabapentin twice daily well for pain. Will continue. - gabapentin (NEURONTIN) 100 MG capsule; Take 1 capsule (100 mg total) by mouth 2 (two) times daily. 1 cap at bedtime for 1 week, then increase to 1 cap at dinner and 1 cap at bedtime.    Pt will schedule to have annual exam prior to leaving today.  Return precautions provided, and patient handout on gabapentin.  Plan to follow up as needed, or for worsening or persistent symptoms despite treatment.  Patient Instructions  Continue gabapentin twice daily for the pain for now. If you feel like you want to take more, call the office and we can increase her prescription to 3 times daily if needed.  Schedule an annual physical exam prior to leaving today.  If emergency symptoms discussed during visit developed, seek medical attention immediately.  Followup as needed, or for worsening or persistent symptoms despite treatment.

## 2014-06-28 ENCOUNTER — Other Ambulatory Visit: Payer: 59

## 2014-07-04 ENCOUNTER — Other Ambulatory Visit (INDEPENDENT_AMBULATORY_CARE_PROVIDER_SITE_OTHER): Payer: 59

## 2014-07-04 DIAGNOSIS — R252 Cramp and spasm: Secondary | ICD-10-CM

## 2014-07-04 DIAGNOSIS — Z0000 Encounter for general adult medical examination without abnormal findings: Secondary | ICD-10-CM

## 2014-07-04 LAB — CBC WITH DIFFERENTIAL/PLATELET
BASOS PCT: 0.6 % (ref 0.0–3.0)
Basophils Absolute: 0 10*3/uL (ref 0.0–0.1)
EOS ABS: 0.2 10*3/uL (ref 0.0–0.7)
Eosinophils Relative: 3.4 % (ref 0.0–5.0)
HEMATOCRIT: 41.7 % (ref 39.0–52.0)
HEMOGLOBIN: 14.2 g/dL (ref 13.0–17.0)
Lymphocytes Relative: 41 % (ref 12.0–46.0)
Lymphs Abs: 2.6 10*3/uL (ref 0.7–4.0)
MCHC: 34 g/dL (ref 30.0–36.0)
MCV: 83.1 fl (ref 78.0–100.0)
MONO ABS: 0.6 10*3/uL (ref 0.1–1.0)
Monocytes Relative: 9 % (ref 3.0–12.0)
NEUTROS ABS: 2.9 10*3/uL (ref 1.4–7.7)
Neutrophils Relative %: 46 % (ref 43.0–77.0)
Platelets: 177 10*3/uL (ref 150.0–400.0)
RBC: 5.02 Mil/uL (ref 4.22–5.81)
RDW: 15.1 % (ref 11.5–15.5)
WBC: 6.3 10*3/uL (ref 4.0–10.5)

## 2014-07-04 LAB — TSH: TSH: 2.49 u[IU]/mL (ref 0.35–4.50)

## 2014-07-04 LAB — POCT URINALYSIS DIPSTICK
BILIRUBIN UA: NEGATIVE
Glucose, UA: NEGATIVE
KETONES UA: NEGATIVE
Leukocytes, UA: NEGATIVE
NITRITE UA: NEGATIVE
PH UA: 7
Protein, UA: NEGATIVE
RBC UA: NEGATIVE
Spec Grav, UA: 1.015
Urobilinogen, UA: 1

## 2014-07-04 LAB — BASIC METABOLIC PANEL
BUN: 19 mg/dL (ref 6–23)
CO2: 24 meq/L (ref 19–32)
Calcium: 8.8 mg/dL (ref 8.4–10.5)
Chloride: 104 mEq/L (ref 96–112)
Creatinine, Ser: 1.2 mg/dL (ref 0.4–1.5)
GFR: 66.73 mL/min (ref >60.00)
Glucose, Bld: 92 mg/dL (ref 70–99)
POTASSIUM: 4.3 meq/L (ref 3.5–5.1)
SODIUM: 137 meq/L (ref 135–145)

## 2014-07-04 LAB — SEDIMENTATION RATE: SED RATE: 15 mm/h (ref 0–22)

## 2014-07-04 LAB — PSA: PSA: 1 ng/mL (ref 0.10–4.00)

## 2014-07-04 LAB — CK: CK TOTAL: 237 U/L — AB (ref 7–232)

## 2014-07-04 LAB — HEPATIC FUNCTION PANEL
ALBUMIN: 3.7 g/dL (ref 3.5–5.2)
ALT: 52 U/L (ref 0–53)
AST: 37 U/L (ref 0–37)
Alkaline Phosphatase: 32 U/L — ABNORMAL LOW (ref 39–117)
Bilirubin, Direct: 0.2 mg/dL (ref 0.0–0.3)
TOTAL PROTEIN: 6.9 g/dL (ref 6.0–8.3)
Total Bilirubin: 1 mg/dL (ref 0.2–1.2)

## 2014-07-04 LAB — LIPID PANEL
Cholesterol: 122 mg/dL (ref 0–200)
HDL: 32.5 mg/dL — ABNORMAL LOW (ref >39.00)
LDL CALC: 64 mg/dL (ref 0–99)
NonHDL: 89.5
Total CHOL/HDL Ratio: 4
Triglycerides: 129 mg/dL (ref 0.0–149.0)
VLDL: 25.8 mg/dL (ref 0.0–40.0)

## 2014-07-04 NOTE — Addendum Note (Signed)
Addended by: Elmer Picker on: 07/04/2014 01:22 PM   Modules accepted: Orders

## 2014-07-06 ENCOUNTER — Encounter: Payer: 59 | Admitting: Internal Medicine

## 2014-07-11 ENCOUNTER — Encounter: Payer: 59 | Admitting: Physician Assistant

## 2014-08-03 ENCOUNTER — Ambulatory Visit (INDEPENDENT_AMBULATORY_CARE_PROVIDER_SITE_OTHER): Payer: 59

## 2014-08-03 ENCOUNTER — Ambulatory Visit: Payer: 59 | Admitting: Physician Assistant

## 2014-08-03 DIAGNOSIS — Z23 Encounter for immunization: Secondary | ICD-10-CM

## 2014-08-15 ENCOUNTER — Telehealth: Payer: Self-pay | Admitting: Internal Medicine

## 2014-08-15 MED ORDER — TADALAFIL 5 MG PO TABS: 5.0000 mg | ORAL_TABLET | Freq: Every day | ORAL | Status: DC

## 2014-08-15 MED ORDER — ATORVASTATIN CALCIUM 20 MG PO TABS
20.0000 mg | ORAL_TABLET | Freq: Every day | ORAL | Status: DC
Start: 2014-08-15 — End: 2015-03-28

## 2014-08-15 NOTE — Telephone Encounter (Signed)
Medications refilled

## 2014-08-15 NOTE — Telephone Encounter (Signed)
Pt request refill  atorvastatin (LIPITOR) 20 MG tablet tadalafil (CIALIS) 5 MG tablet  Wife states she thinks pt is getting name brand cialis. If so, pt would like generic. [facility]/ [address]  Pt has appt 12/09/14.  Do you need to see pt for these meds prior to then?

## 2014-08-15 NOTE — Telephone Encounter (Signed)
Dr. Hunter please advise. 

## 2014-08-15 NOTE — Telephone Encounter (Signed)
May refill both -put note on cialis for generic please

## 2014-12-09 ENCOUNTER — Encounter: Payer: 59 | Admitting: Family Medicine

## 2014-12-12 ENCOUNTER — Telehealth: Payer: Self-pay | Admitting: Internal Medicine

## 2014-12-12 NOTE — Telephone Encounter (Signed)
Patient was scheduled to see Dr. Yong Channel on Friday but had to cancel due to Dr. Ansel Bong illness.  Can we work the patient in for his r/s office visit?

## 2014-12-12 NOTE — Telephone Encounter (Signed)
March 9th looks ok if works for patient.

## 2014-12-12 NOTE — Telephone Encounter (Signed)
Where would like them to work pt in?

## 2014-12-13 NOTE — Telephone Encounter (Signed)
Patient is scheduled   

## 2014-12-22 ENCOUNTER — Other Ambulatory Visit: Payer: Self-pay | Admitting: *Deleted

## 2014-12-22 MED ORDER — OLMESARTAN MEDOXOMIL 40 MG PO TABS: 40.0000 mg | ORAL_TABLET | Freq: Every day | ORAL | Status: DC

## 2014-12-28 ENCOUNTER — Ambulatory Visit (INDEPENDENT_AMBULATORY_CARE_PROVIDER_SITE_OTHER): Payer: 59 | Admitting: Family Medicine

## 2014-12-28 ENCOUNTER — Encounter: Payer: Self-pay | Admitting: Family Medicine

## 2014-12-28 VITALS — BP 118/54 | HR 65 | Temp 98.1°F | Wt 230.0 lb

## 2014-12-28 DIAGNOSIS — Z85828 Personal history of other malignant neoplasm of skin: Secondary | ICD-10-CM | POA: Insufficient documentation

## 2014-12-28 DIAGNOSIS — Z87891 Personal history of nicotine dependence: Secondary | ICD-10-CM | POA: Insufficient documentation

## 2014-12-28 DIAGNOSIS — G5710 Meralgia paresthetica, unspecified lower limb: Secondary | ICD-10-CM

## 2014-12-28 DIAGNOSIS — F528 Other sexual dysfunction not due to a substance or known physiological condition: Secondary | ICD-10-CM

## 2014-12-28 DIAGNOSIS — I10 Essential (primary) hypertension: Secondary | ICD-10-CM

## 2014-12-28 DIAGNOSIS — E785 Hyperlipidemia, unspecified: Secondary | ICD-10-CM

## 2014-12-28 MED ORDER — TADALAFIL 20 MG PO TABS: 10.0000 mg | ORAL_TABLET | ORAL | Status: DC | PRN

## 2014-12-28 NOTE — Progress Notes (Signed)
Garret Reddish, MD Phone: [phone]  Subjective:  Patient presents today to establish care with me as their new primary care provider. Patient was formerly a patient of Dr. Arnoldo Morale and also saw matt Berline Lopes. Chief complaint-noted.   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 No myalgias  Hypertension-controlled on benicar 40mg   BP Readings from Last 3 Encounters:  12/28/14 118/54  06/14/14 110/68  04/18/14 124/68   Home BP monitoring-no Compliant with medications-yes without side effects ROS-Denies any CP, HA, SOB, blurry vision, LE edema  Meralgia Paresthetica -reasonable control on gabapentin 100mg  1-2x a day. Pain primarily controlle.d occasional numbness still. Better if not wearing belt or tightness around waist. Better with wallet out while driving.  ROS- no worsening of symptoms of pain or numbness  The following were reviewed and entered/updated in epic: Past Medical History  Diagnosis Date  . GERD (gastroesophageal reflux disease)   . Hypertension   . Anemia   . Hyperlipidemia   . Diverticulitis of colon   . ALLERGIC RHINITIS   . Ulcer 1992    gastric  . Adenomatous colon polyp 06/2006  . ANEMIA DUE TO DIETARY IRON DEFICIENCY 05/28/2007    Due to giving regularly giving blood. Resolved with cutting in half.     [name] DIVERTICULOSIS, COLON 05/28/2007    Qualifier: Diagnosis of  By: Arnoldo Morale MD, Michie, THORACIC VERTEBRA 03/09/2008    2009, no chronic pain    Patient Active Problem List   Diagnosis Date Noted  . Meralgia paresthetica 12/28/2014    Priority: Medium  . Hyperlipidemia 05/15/2007    Priority: Medium  . Essential hypertension 05/15/2007    Priority: Medium  . History of skin cancer 12/28/2014    Priority: Low  .  Former smoker 12/28/2014    Priority: Low  . Abnormal liver enzymes 12/29/2010    Priority: Low  . Allergic rhinitis 06/17/2008    Priority: Low  . ERECTILE DYSFUNCTION 12/07/2007    Priority: Low  . History of colonic polyps 12/07/2007    Priority: Low  . Umbilical hernia 37/07/6268    Priority: Low  . GERD 05/15/2007    Priority: Low   Past Surgical History  Procedure Laterality Date  . Foot surgery      pain scraper several inches into foot  . Colonoscopy w/ polypectomy      Family History  Problem Relation Age of Onset  . Cancer Father     lung, former smoker, [place] lung [place]  . Hemochromatosis Brother   . Dementia Mother   . Colon cancer Neg Hx     Medications- reviewed and updated Current Outpatient Prescriptions  Medication Sig Dispense Refill  . aspirin 81 MG tablet Take 81 mg by mouth daily.      [name] atorvastatin (LIPITOR) 20 MG tablet Take 1 tablet (20 mg total) by mouth daily at 6 PM. 90 tablet 1  . Folic Acid 485 MCG TABS Take 2 tablets by mouth daily.      [name] gabapentin (NEURONTIN) 100 MG capsule Take 1 capsule (100 mg total) by mouth 2 (two) times daily. 1 cap at bedtime for 1 week, then increase to 1 cap at dinner and 1 cap at bedtime. 60 capsule 3  . MULTIPLE VITAMIN  PO Take by mouth daily.      [name] olmesartan (BENICAR) 40 MG tablet Take 1 tablet (40 mg total) by mouth daily. 30 tablet 0  . Omega-3 Fatty Acids (FISH OIL) 1000 MG CAPS Take 2 capsules by mouth 2 (two) times daily.      [name] omeprazole (PRILOSEC) 40 MG capsule take 1 capsule by mouth once daily 30 capsule 9  . tadalafil (CIALIS) 5 MG tablet Take 1 tablet (5 mg total) by mouth daily. 30 tablet 1   No current facility-administered medications for this visit.    Allergies-reviewed and updated Allergies  Allergen Reactions  . Ace Inhibitors     REACTION: cough    History   Social History  . Marital Status: Married    Spouse Name: N/A  . Number of Children: N/A  . Years of Education:  N/A   Social History Main Topics  . Smoking status: Former Smoker -- 1.00 packs/day for 15 years    Types: Cigarettes    Quit date: 10/21/1978  . Smokeless tobacco: Never Used  . Alcohol Use: No     Comment: Quit drinking 2006  . Drug Use: No  . Sexual Activity: Yes   Other Topics Concern  . None   Social History Narrative   Married (wife pt of Dr. Maudie Mercury). 5 children (2 by first wife, 3 stepkids), 11 granddaughters + 2 greatgrandsons.       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  CV: RRR no murmurs rubs or gallops Lungs: CTAB no crackles, wheeze, rhonchi Abdomen: soft/nontender/nondistended/normal bowel sounds. No rebound or guarding.  Ext: no edema Skin: warm, dry, no rash Neuro: grossly normal, moves all extremities, PERRLA   Assessment/Plan:  Hyperlipidemia Controlled on atorvastatin 20mg  with LDL <70.    Essential hypertension Controlled on olmesartan 40mg    Meralgia paresthetica Reasonable control on gabapentin 100mg  in AM, and occasionally takes another dose at night.    ERECTILE DYSFUNCTION S: poor control on cialis 5mg  daily and later tried prn with poor control. Ros- no penile pain or discharge A/P: increase to 10mg  and can trial up to 20mg  if needed.    6 month follow up for CPE  Meds ordered this encounter  Medications  . tadalafil (CIALIS) 20 MG tablet    Sig: Take 0.5-1 tablets (10-20 mg total) by mouth every other day as needed for erectile dysfunction.    Dispense:  5 tablet    Refill:  11

## 2014-12-28 NOTE — Assessment & Plan Note (Signed)
S: poor control on cialis 5mg  daily and later tried prn with poor control. Ros- no penile pain or discharge A/P: increase to 10mg  and can trial up to 20mg  if needed.

## 2014-12-28 NOTE — Assessment & Plan Note (Signed)
Controlled on atorvastatin 20mg  with LDL <70.

## 2014-12-28 NOTE — Assessment & Plan Note (Signed)
Controlled on olmesartan 40mg 

## 2014-12-28 NOTE — Patient Instructions (Addendum)
Check with your insurance about shingles vaccine  Increase Cialis to 10mg , can trial 20mg  if that doesn't help  Cholesterol and blood pressure have looked great and last liver test looked good.   See me for a physical after 07/05/15. Then every 6 months.

## 2014-12-28 NOTE — Assessment & Plan Note (Signed)
Reasonable control on gabapentin 100mg  in AM, and occasionally takes another dose at night.

## 2014-12-30 ENCOUNTER — Other Ambulatory Visit: Payer: Self-pay | Admitting: *Deleted

## 2014-12-30 MED ORDER — OMEPRAZOLE 40 MG PO CPDR: 40.0000 mg | DELAYED_RELEASE_CAPSULE | Freq: Every day | ORAL | Status: DC

## 2015-01-19 ENCOUNTER — Telehealth: Payer: Self-pay | Admitting: *Deleted

## 2015-01-19 MED ORDER — OLMESARTAN MEDOXOMIL 40 MG PO TABS: 40.0000 mg | ORAL_TABLET | Freq: Every day | ORAL | Status: DC

## 2015-01-19 NOTE — Telephone Encounter (Signed)
Rx request for Benicar 40 mg tablet Qty: 30 at [facility]. sent in.

## 2015-01-24 ENCOUNTER — Other Ambulatory Visit: Payer: Self-pay | Admitting: Internal Medicine

## 2015-03-28 ENCOUNTER — Encounter: Payer: Self-pay | Admitting: Family Medicine

## 2015-03-28 ENCOUNTER — Ambulatory Visit (INDEPENDENT_AMBULATORY_CARE_PROVIDER_SITE_OTHER): Payer: 59 | Admitting: Family Medicine

## 2015-03-28 VITALS — BP 122/64 | HR 70 | Temp 98.4°F | Wt 234.0 lb

## 2015-03-28 DIAGNOSIS — R202 Paresthesia of skin: Secondary | ICD-10-CM | POA: Diagnosis not present

## 2015-03-28 DIAGNOSIS — M542 Cervicalgia: Secondary | ICD-10-CM

## 2015-03-28 DIAGNOSIS — R200 Anesthesia of skin: Secondary | ICD-10-CM

## 2015-03-28 MED ORDER — PREDNISONE 20 MG PO TABS: ORAL_TABLET | ORAL | Status: DC

## 2015-03-28 NOTE — Patient Instructions (Addendum)
Neck pain with some numbness/tingling  Treat as if pinched nerve in neck   Prednisone for 7 days  Let's check in on 20th or 21st of this month  If not better, may get imaging of head with CT and at least x-ray of neck to start. May eventually need MRI neck.   If you had new or worsening symptoms please see [place] sooner

## 2015-03-28 NOTE — Progress Notes (Signed)
Garret Reddish, MD  Subjective:  Larry Williamson is a 64 y.o. year old very pleasant male patient who presents with:  Neck pain leading to some numbness in neck and head as well as mild headache -a few months (about 2) ago started with left sided neck pain that radiates up above ear and then back down to same area passing over front of ear. Gets a numb and tingling feeling in the same area at times. It will last a minute or so and ease off multiple times a day and even multiple times an hour at times. Seems to be slowly increasing in frequency. Mild ache of a pain in regards to headache. The tingling sensation is most bothersome. Not positional. Not exertional.   ROS- no hearing loss. No blurry vision. No weakness in an arm or leg. No chest pain, shortness of breath, nausea. No numbness/tingling in arm or weakness in arm.   Past Medical History- hyperlipidemia on lipitor previously-severe cramps, hypertension, meralgia parestheica  Medications- reviewed and updated Current Outpatient Prescriptions  Medication Sig Dispense Refill  . aspirin 81 MG tablet Take 81 mg by mouth daily.      . Folic Acid 332 MCG TABS Take 2 tablets by mouth daily.      [name] gabapentin (NEURONTIN) 100 MG capsule take 1 capsule by mouth twice a day *1 CAPSULE AT BEDTIME FOR 1 WEEK, THEN INCREASE TO 1 CAP AT DINNER AND 1 CAP AT BEDTIME 60 capsule 3  . MULTIPLE VITAMIN PO Take by mouth daily.      [name] olmesartan (BENICAR) 40 MG tablet Take 1 tablet (40 mg total) by mouth daily. 30 tablet 5  . Omega-3 Fatty Acids (FISH OIL) 1000 MG CAPS Take 2 capsules by mouth 2 (two) times daily.      [name] omeprazole (PRILOSEC) 40 MG capsule Take 1 capsule (40 mg total) by mouth daily. 30 capsule 11  . tadalafil (CIALIS) 20 MG tablet Take 0.5-1 tablets (10-20 mg total) by mouth every other day as needed for erectile dysfunction. 5 tablet 11  . predniSONE (DELTASONE) 20 MG tablet Take 2 tabs for 3 days then 1 tab for 4 days then stop 7  tablet 0   No current facility-administered medications for this visit.   Objective: BP 122/64 mmHg  Pulse 70  Temp(Src) 98.4 F (36.9 C)  Wt 234 lb (106.142 kg) Gen: NAD, resting comfortably CV: RRR no murmurs rubs or gallops Lungs: CTAB no crackles, wheeze, rhonchi Abdomen: soft/nontender/nondistended/normal bowel sounds. No rebound or guarding.  Ext: no edema Skin: warm, dry Spurling's to left causes worsening pain in L neck with some radiation into head Neuro: CN II-XII intact, sensation and reflexes normal throughout, 5/5 muscle strength in bilateral upper and lower extremities. Normal finger to nose. Normal rapid alternating movements.   Assessment/Plan:  L Neck pain leading to some numbness in neck and head as well as mild headache I am suspicious for cervical nerve irritation possibly due to underlying arthritis of neck (positive spurling and normal neuro exam). We opted to treat with prednisone for 7 days and follow up on the 20th or 21st of the month or sooner if new or worsening symptoms.   We did discuss with new headache in patient over 50, getting CT of head but opted against as prefer conservative trial with prednisone. Doubt CVA and normal neuro exam. Also discussed neck films as baseline. We may obtain both at follow up. Considered ESR/CRP for temporal arteritis workup but given  2 months of symptoms and worsening with spurlings, I have very low concern for this. Not exertional and strongly doubt cardiac as well.    Meds ordered this encounter  . predniSONE (DELTASONE) 20 MG tablet    Sig: Take 2 tabs for 3 days then 1 tab for 4 days then stop    Dispense:  10 tablet    Refill:  0    Updated rx as quantity was insufficient

## 2015-04-10 ENCOUNTER — Ambulatory Visit: Payer: 59 | Admitting: Family Medicine

## 2015-04-10 ENCOUNTER — Telehealth: Payer: Self-pay | Admitting: Family Medicine

## 2015-04-10 NOTE — Telephone Encounter (Signed)
Pt had appt this am, but had a emergency call and is not going to be able to get back in time.  Pt states he is still having problems though and needs to keep this appt.  Is it ok to work in this week?

## 2015-04-10 NOTE — Telephone Encounter (Signed)
yes

## 2015-04-12 ENCOUNTER — Encounter: Payer: Self-pay | Admitting: Family Medicine

## 2015-04-12 ENCOUNTER — Ambulatory Visit (INDEPENDENT_AMBULATORY_CARE_PROVIDER_SITE_OTHER): Payer: 59 | Admitting: Family Medicine

## 2015-04-12 VITALS — BP 134/72 | HR 84 | Temp 98.0°F | Wt 232.0 lb

## 2015-04-12 DIAGNOSIS — R200 Anesthesia of skin: Secondary | ICD-10-CM

## 2015-04-12 DIAGNOSIS — R202 Paresthesia of skin: Secondary | ICD-10-CM

## 2015-04-12 DIAGNOSIS — M542 Cervicalgia: Secondary | ICD-10-CM

## 2015-04-12 NOTE — Patient Instructions (Signed)
You opted to pursue chiropractic care as first step. Trial [facility]. If they take x-rays have them send [place] the report.   If you are not making improvement or are worsening, let's get you into orthopedics .

## 2015-04-12 NOTE — Progress Notes (Signed)
Garret Reddish, MD  Subjective:  Larry Williamson is a 64 y.o. year old very pleasant male patient who presents with:  Neck pain leading to some numbness in neck and head as well as mild headache Now almost 3 months of symptoms. Continues to be left sided neck pain that radiates up above ear and then back down to same area passing over front of ear. Gets a numb and tingling feeling in the same area at times. Lastsa a few minutes. Multiple times a day. Resolved completely on prednisone then when medicine stopped, recurred. Similar frequency and intensity to prior to prednisone at this time. Rates pain mild to moderate. No exertional symptoms. Occasional left hand numbness but doesn't usually go along with pain and tingling.  ROS- no hearing loss. No blurry vision. No weakness in an arm or leg. No chest pain, shortness of breath, nausea. No numbness/tingling in arm or weakness in arm.   Past Medical History- HLD,HTN, meralgia paresthetica on gabapentin  Medications- reviewed and updated Current Outpatient Prescriptions  Medication Sig Dispense Refill  . aspirin 81 MG tablet Take 81 mg by mouth daily.      . Folic Acid 245 MCG TABS Take 2 tablets by mouth daily.      [name] gabapentin (NEURONTIN) 100 MG capsule take 1 capsule by mouth twice a day *1 CAPSULE AT BEDTIME FOR 1 WEEK, THEN INCREASE TO 1 CAP AT DINNER AND 1 CAP AT BEDTIME 60 capsule 3  . MULTIPLE VITAMIN PO Take by mouth daily.      [name] olmesartan (BENICAR) 40 MG tablet Take 1 tablet (40 mg total) by mouth daily. 30 tablet 5  . Omega-3 Fatty Acids (FISH OIL) 1000 MG CAPS Take 2 capsules by mouth 2 (two) times daily.      [name] omeprazole (PRILOSEC) 40 MG capsule Take 1 capsule (40 mg total) by mouth daily. 30 capsule 11  . tadalafil (CIALIS) 20 MG tablet Take 0.5-1 tablets (10-20 mg total) by mouth every other day as needed for erectile dysfunction. 5 tablet 11   No current facility-administered medications for this visit.     Objective: BP 134/72 mmHg  Pulse 84  Temp(Src) 98 F (36.7 C)  Wt 232 lb (105.235 kg) Gen: NAD, resting comfortably CV: RRR no murmurs rubs or gallops Lungs: CTAB no crackles, wheeze, rhonchi Abdomen: soft/nontender/nondistended/normal bowel sounds. No rebound or guarding.  Ext: no edema Skin: warm, dry Spurling's to left causes worsening pain in L neck with some radiation into head Neuro: grossly normal, normal gait  Assessment/Plan:  L Neck pain leading to some numbness in neck and head as well as mild headache Today reports history cervical nerve impingement in past treated by orthopedics with PT that didn't help but resolved on its own with time. I offered patient referral to ortho and he declined. Complete resolution on prednisone and positive spurling make cervical nerve root impingement high on my differential. Headache portion is more minor symptom and we opted against brain imaging with CT or MRI. Also pain not really near temporal artery and no jaw claudication or scalp sensitivity so doubt ESR/CRP helpful.  He wants to trial chiropractic care (suggestion per AVS) but if no improvement within a month or worsening, agrees to ortho referral.   Return precautions advised.

## 2015-06-12 ENCOUNTER — Encounter: Payer: Self-pay | Admitting: Family Medicine

## 2015-06-13 NOTE — Telephone Encounter (Signed)
Great pick up [place]. Please tell patient we had planned orthopedics as next step. Refer if he is agreeable to this

## 2015-06-14 NOTE — Telephone Encounter (Signed)
I am not sure why this was sent back to me.   Once again  "[place] pick up [place]. Please tell patient we had planned orthopedics as next step. Refer if he is agreeable to this "

## 2015-07-04 ENCOUNTER — Other Ambulatory Visit: Payer: Self-pay | Admitting: Orthopaedic Surgery

## 2015-07-04 DIAGNOSIS — M542 Cervicalgia: Secondary | ICD-10-CM

## 2015-07-05 ENCOUNTER — Ambulatory Visit
Admission: RE | Admit: 2015-07-05 | Discharge: 2015-07-05 | Disposition: A | Discharge status: Home / Self Care | Payer: Commercial Managed Care - HMO | Source: Ambulatory Visit | Attending: Orthopaedic Surgery | Admitting: Orthopaedic Surgery

## 2015-07-05 DIAGNOSIS — M542 Cervicalgia: Secondary | ICD-10-CM

## 2015-07-06 ENCOUNTER — Other Ambulatory Visit (INDEPENDENT_AMBULATORY_CARE_PROVIDER_SITE_OTHER): Payer: Commercial Managed Care - HMO

## 2015-07-06 DIAGNOSIS — Z0000 Encounter for general adult medical examination without abnormal findings: Secondary | ICD-10-CM | POA: Diagnosis not present

## 2015-07-06 LAB — CBC WITH DIFFERENTIAL/PLATELET
Basophils Absolute: 0 10*3/uL (ref 0.0–0.1)
Basophils Relative: 0.8 % (ref 0.0–3.0)
EOS PCT: 4.8 % (ref 0.0–5.0)
Eosinophils Absolute: 0.3 10*3/uL (ref 0.0–0.7)
HCT: 38.9 % — ABNORMAL LOW (ref 39.0–52.0)
HEMOGLOBIN: 12.8 g/dL — AB (ref 13.0–17.0)
Lymphocytes Relative: 40.1 % (ref 12.0–46.0)
Lymphs Abs: 2.3 10*3/uL (ref 0.7–4.0)
MCHC: 32.9 g/dL (ref 30.0–36.0)
MCV: 80.8 fl (ref 78.0–100.0)
MONOS PCT: 9.3 % (ref 3.0–12.0)
Monocytes Absolute: 0.5 10*3/uL (ref 0.1–1.0)
Neutro Abs: 2.6 10*3/uL (ref 1.4–7.7)
Neutrophils Relative %: 45 % (ref 43.0–77.0)
Platelets: 199 10*3/uL (ref 150.0–400.0)
RBC: 4.81 Mil/uL (ref 4.22–5.81)
RDW: 15.6 % — ABNORMAL HIGH (ref 11.5–15.5)
WBC: 5.7 10*3/uL (ref 4.0–10.5)

## 2015-07-06 LAB — BASIC METABOLIC PANEL
BUN: 19 mg/dL (ref 6–23)
CO2: 27 mEq/L (ref 19–32)
Calcium: 9.6 mg/dL (ref 8.4–10.5)
Chloride: 105 mEq/L (ref 96–112)
Creatinine, Ser: 1.26 mg/dL (ref 0.40–1.50)
GFR: 61.07 mL/min (ref >60.00)
GLUCOSE: 93 mg/dL (ref 70–99)
POTASSIUM: 5.2 meq/L — AB (ref 3.5–5.1)
SODIUM: 144 meq/L (ref 135–145)

## 2015-07-06 LAB — POCT URINALYSIS DIPSTICK
Bilirubin, UA: NEGATIVE
Glucose, UA: NEGATIVE
Ketones, UA: NEGATIVE
Leukocytes, UA: NEGATIVE
NITRITE UA: NEGATIVE
PH UA: 6
Protein, UA: NEGATIVE
RBC UA: NEGATIVE
Spec Grav, UA: 1.015
UROBILINOGEN UA: 0.2

## 2015-07-06 LAB — HEPATIC FUNCTION PANEL
ALBUMIN: 4.2 g/dL (ref 3.5–5.2)
ALT: 48 U/L (ref 0–53)
AST: 31 U/L (ref 0–37)
Alkaline Phosphatase: 37 U/L — ABNORMAL LOW (ref 39–117)
Bilirubin, Direct: 0.2 mg/dL (ref 0.0–0.3)
Total Bilirubin: 0.9 mg/dL (ref 0.2–1.2)
Total Protein: 7 g/dL (ref 6.0–8.3)

## 2015-07-06 LAB — LIPID PANEL
CHOLESTEROL: 201 mg/dL — AB (ref 0–200)
HDL: 40.9 mg/dL (ref >39.00)
LDL Cholesterol: 128 mg/dL — ABNORMAL HIGH (ref 0–99)
NonHDL: 160.3
TRIGLYCERIDES: 162 mg/dL — AB (ref 0.0–149.0)
Total CHOL/HDL Ratio: 5
VLDL: 32.4 mg/dL (ref 0.0–40.0)

## 2015-07-06 LAB — PSA: PSA: 1.08 ng/mL (ref 0.10–4.00)

## 2015-07-06 LAB — TSH: TSH: 2.56 u[IU]/mL (ref 0.35–4.50)

## 2015-07-12 ENCOUNTER — Other Ambulatory Visit: Payer: Self-pay | Admitting: Family Medicine

## 2015-07-13 ENCOUNTER — Encounter: Payer: Self-pay | Admitting: Family Medicine

## 2015-07-13 ENCOUNTER — Ambulatory Visit (INDEPENDENT_AMBULATORY_CARE_PROVIDER_SITE_OTHER): Payer: Commercial Managed Care - HMO | Admitting: Family Medicine

## 2015-07-13 VITALS — BP 122/64 | HR 74 | Temp 97.6°F | Ht 70.0 in | Wt 231.0 lb

## 2015-07-13 DIAGNOSIS — Z23 Encounter for immunization: Secondary | ICD-10-CM | POA: Diagnosis not present

## 2015-07-13 DIAGNOSIS — E875 Hyperkalemia: Secondary | ICD-10-CM

## 2015-07-13 DIAGNOSIS — Z0000 Encounter for general adult medical examination without abnormal findings: Secondary | ICD-10-CM | POA: Diagnosis not present

## 2015-07-13 LAB — BASIC METABOLIC PANEL
BUN: 19 mg/dL (ref 6–23)
CALCIUM: 9.4 mg/dL (ref 8.4–10.5)
CO2: 29 meq/L (ref 19–32)
Chloride: 103 mEq/L (ref 96–112)
Creatinine, Ser: 1.2 mg/dL (ref 0.40–1.50)
GFR: 64.6 mL/min (ref >60.00)
GLUCOSE: 67 mg/dL — AB (ref 70–99)
POTASSIUM: 4.2 meq/L (ref 3.5–5.1)
Sodium: 140 mEq/L (ref 135–145)

## 2015-07-13 MED ORDER — PRAVASTATIN SODIUM 40 MG PO TABS: 40.0000 mg | ORAL_TABLET | Freq: Every day | ORAL | Status: DC

## 2015-07-13 NOTE — Progress Notes (Signed)
Garret Reddish, MD Phone: [phone]  Subjective:  Patient presents today for their annual physical. Chief complaint-noted.    >14% 10 year risk regarding lipids. Cramps on atorvastatin. Trial pravastatin -MRI neck shows bulging disc. Going to have steroid injection.  Otherwise doing well  ROS- full  review of systems was completed and negative except for: neck pain due to bulging disc. No cramps off atorvastatin. No chest pain or shortness of breath  The following were reviewed and entered/updated in epic: Past Medical History  Diagnosis Date  . GERD (gastroesophageal reflux disease)   . Hypertension   . Anemia   . Hyperlipidemia   . Diverticulitis of colon   . ALLERGIC RHINITIS   . Ulcer 1992    gastric  . Adenomatous colon polyp 06/2006  . ANEMIA DUE TO DIETARY IRON DEFICIENCY 05/28/2007    Due to giving regularly giving blood. Resolved with cutting in half.     [name] DIVERTICULOSIS, COLON 05/28/2007    Qualifier: Diagnosis of  By: Arnoldo Morale MD, [place], THORACIC VERTEBRA 03/09/2008    2009, no chronic pain    Patient Active Problem List   Diagnosis Date Noted  . Meralgia paresthetica 12/28/2014    Priority: Medium  . Hyperlipidemia 05/15/2007    Priority: Medium  . Essential hypertension 05/15/2007    Priority: Medium  . History of skin cancer 12/28/2014    Priority: Low  . Former smoker 12/28/2014    Priority: Low  . Abnormal liver enzymes 12/29/2010    Priority: Low  . Allergic rhinitis 06/17/2008    Priority: Low  . ERECTILE DYSFUNCTION 12/07/2007    Priority: Low  . History of colonic polyps 12/07/2007    Priority: Low  . Umbilical hernia 09/81/1914    Priority: Low  . GERD 05/15/2007    Priority: Low   Past Surgical History  Procedure Laterality Date  . Foot surgery      pain scraper several inches into foot  . Colonoscopy w/ polypectomy      Family History  Problem Relation Age of Onset  . Cancer Father     lung, former  smoker, [place] lung in [place]  . Hemochromatosis Brother   . Dementia Mother   . Colon cancer Neg Hx     Medications- reviewed and updated Current Outpatient Prescriptions  Medication Sig Dispense Refill  . aspirin 81 MG tablet Take 81 mg by mouth daily.      . Folic Acid 782 MCG TABS Take 2 tablets by mouth daily.      [name] gabapentin (NEURONTIN) 100 MG capsule take 1 capsule by mouth at bedtime for 1 week then INCREASE TO 1 capsule twice a day ( 1 AT DINNER and 1 at bedtime ) 60 capsule 3  . MULTIPLE VITAMIN PO Take by mouth daily.      [name] olmesartan (BENICAR) 40 MG tablet Take 1 tablet (40 mg total) by mouth daily. 30 tablet 5  . Omega-3 Fatty Acids (FISH OIL) 1000 MG CAPS Take 2 capsules by mouth 2 (two) times daily.      [name] omeprazole (PRILOSEC) 40 MG capsule Take 1 capsule (40 mg total) by mouth daily. 30 capsule 11  . tadalafil (CIALIS) 20 MG tablet Take 0.5-1 tablets (10-20 mg total) by mouth every other day as needed for erectile dysfunction. 5 tablet 11   No current facility-administered medications for this visit.    Allergies-reviewed and updated Allergies  Allergen Reactions  . Ace Inhibitors  REACTION: cough    Social History   Social History  . Marital Status: Married    Spouse Name: N/A  . Number of Children: N/A  . Years of Education: N/A   Social History Main Topics  . Smoking status: Former Smoker -- 1.00 packs/day for 15 years    Types: Cigarettes    Quit date: 10/21/1978  . Smokeless tobacco: Never Used  . Alcohol Use: No     Comment: Quit drinking 2006  . Drug Use: No  . Sexual Activity: Yes   Other Topics Concern  . Not on file   Social History Narrative   Married (wife pt of Dr. Maudie Mercury). 5 children (2 by first wife, 3 stepkids), 11 granddaughters + 2 greatgrandsons.       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 Oropharynx normal. Bilateral cerumen impaction Neck: no thyromegaly CV: RRR no murmurs rubs or gallops Lungs: CTAB no crackles, wheeze, rhonchi Abdomen: soft/nontender/nondistended/normal bowel sounds. No rebound or guarding.  Rectal: normal tone, normal prostate, no masses or tenderness Ext: no edema Skin: warm, dry Neuro: grossly normal, moves all extremities, PERRLA  Assessment/Plan:  64 y.o. male presenting for annual physical.  Health Maintenance counseling: 1. Anticipatory guidance: Patient counseled regarding regular dental exams, wearing seatbelts, wearing sunscreen, regular eye exams 2. Risk factor reduction:  Advised patient of need for regular exercise and diet rich and fruits and vegetables to reduce risk of heart attack and stroke. Joining [employer] next month with silver sneakers-trying to get started.  3. Immunizations/screenings/ancillary studies- flu today Health Maintenance Due  Topic Date Due  . Hepatitis C Screening - declined 07-09-1951  . ZOSTAVAX - not sure if had chicken pox 08/11/2010  4. Prostate cancer screening- low risk based on PSA and rectal  5. Colon cancer screening - repeat colonoscopy normal 2013, plan 2018 due to adenoma in 2007 6. Skin cancer screening- sees Dr Jerrell Belfast yearly  BP controlled Lipids up- change to pravastatin Recheck potassium- suspect lab error Irrigate ears per Bevelyn Ngo- if unsuccessful- mineral oil or ENT  Follow up 6 months

## 2015-07-13 NOTE — Patient Instructions (Addendum)
Flu shot received today.  Recheck potassium before you leave through labwork  Start pravastatin- weaker statin to see if you can avoid having cramps   Would get an updated eye exam  Follow up in 6 months

## 2015-07-28 ENCOUNTER — Other Ambulatory Visit: Payer: Self-pay | Admitting: Family Medicine

## 2015-08-27 ENCOUNTER — Encounter: Payer: Self-pay | Admitting: Family Medicine

## 2015-08-27 DIAGNOSIS — I10 Essential (primary) hypertension: Secondary | ICD-10-CM

## 2015-08-28 MED ORDER — VALSARTAN 320 MG PO TABS: 320.0000 mg | ORAL_TABLET | Freq: Every day | ORAL | Status: DC

## 2015-11-01 ENCOUNTER — Telehealth: Payer: Self-pay | Admitting: Family Medicine

## 2015-11-01 NOTE — Telephone Encounter (Signed)
Thanks, will see him tomorrow.

## 2015-11-01 NOTE — Telephone Encounter (Signed)
Patient Name: Larry Williamson  DOB: 08-Nov-1950    Initial Comment Caller states he had vomiting Friday, has had diarrhea since. Nauseated, sneezing, runny nose.   Nurse Assessment  Nurse: Orvan Seen, RN, Jacquilin Date/Time (Eastern Time): 11/01/2015 9:13:41 AM  Confirm and document reason for call. If symptomatic, describe symptoms. ---Caller states he had vomiting Friday, has had diarrhea since. Nauseated, sneezing, runny nose.  Has the patient traveled out of the country within the last 30 days? ---No  Does the patient have any new or worsening symptoms? ---Yes  Will a triage be completed? ---Yes  Related visit to physician within the last 2 weeks? ---No  Does the PT have any chronic conditions? (i.e. diabetes, asthma, etc.) ---Yes  List chronic conditions. ---gerd, diverticulitis  Is this a behavioral health or substance abuse call? ---No     Guidelines    Guideline Title Affirmed Question Affirmed Notes  Diarrhea [1] MODERATE diarrhea (e.g., 4-6 times / day more than normal) AND [2] present > 48 hours (2 days)    Final Disposition User   See Physician within 24 Hours Atkins, RN, Jacquilin    Referrals  REFERRED TO PCP OFFICE   Disagree/Comply: Comply   Appointment 11/01/14 at 1130

## 2015-11-01 NOTE — Telephone Encounter (Signed)
Pending appt with Dr. Yong Channel 1/12

## 2015-11-02 ENCOUNTER — Ambulatory Visit: Payer: Commercial Managed Care - HMO | Admitting: Family Medicine

## 2015-11-28 ENCOUNTER — Other Ambulatory Visit: Payer: Self-pay | Admitting: Family Medicine

## 2015-11-28 MED ORDER — GABAPENTIN 100 MG PO CAPS: ORAL_CAPSULE | ORAL | Status: DC

## 2015-12-28 ENCOUNTER — Encounter: Payer: Self-pay | Admitting: Family Medicine

## 2016-01-02 ENCOUNTER — Telehealth: Payer: Self-pay | Admitting: Family Medicine

## 2016-01-02 ENCOUNTER — Other Ambulatory Visit: Payer: Self-pay

## 2016-01-02 MED ORDER — GABAPENTIN 100 MG PO CAPS: ORAL_CAPSULE | ORAL | Status: DC

## 2016-01-02 MED ORDER — OMEPRAZOLE 40 MG PO CPDR: 40.0000 mg | DELAYED_RELEASE_CAPSULE | Freq: Every day | ORAL | Status: DC

## 2016-01-02 NOTE — Telephone Encounter (Signed)
Medications refilled

## 2016-01-02 NOTE — Telephone Encounter (Signed)
Pt needs refill on gabapentin  100 mg and omeprazole 40 mg #90 each w/refills sent to [facility] [address]

## 2016-01-09 ENCOUNTER — Encounter: Payer: Self-pay | Admitting: Family Medicine

## 2016-01-09 ENCOUNTER — Ambulatory Visit (INDEPENDENT_AMBULATORY_CARE_PROVIDER_SITE_OTHER): Payer: Medicare Other | Admitting: Family Medicine

## 2016-01-09 VITALS — BP 126/72 | HR 68 | Temp 98.9°F | Wt 233.0 lb

## 2016-01-09 DIAGNOSIS — I10 Essential (primary) hypertension: Secondary | ICD-10-CM | POA: Diagnosis not present

## 2016-01-09 DIAGNOSIS — Z87891 Personal history of nicotine dependence: Secondary | ICD-10-CM | POA: Diagnosis not present

## 2016-01-09 DIAGNOSIS — E785 Hyperlipidemia, unspecified: Secondary | ICD-10-CM

## 2016-01-09 DIAGNOSIS — Z0000 Encounter for general adult medical examination without abnormal findings: Secondary | ICD-10-CM

## 2016-01-09 DIAGNOSIS — Z23 Encounter for immunization: Secondary | ICD-10-CM | POA: Diagnosis not present

## 2016-01-09 DIAGNOSIS — K219 Gastro-esophageal reflux disease without esophagitis: Secondary | ICD-10-CM

## 2016-01-09 DIAGNOSIS — G5710 Meralgia paresthetica, unspecified lower limb: Secondary | ICD-10-CM

## 2016-01-09 NOTE — Assessment & Plan Note (Signed)
S: controlled. On valsartan 320mg   BP Readings from Last 3 Encounters:  01/09/16 126/72  07/13/15 122/64  04/12/15 134/72  A/P:Continue current meds:  Doing well

## 2016-01-09 NOTE — Assessment & Plan Note (Signed)
S: controlled on Omeprazole 40mg . Symptoms return if misses even one dose. Takes in morning A/P: we will trial OTC 20mg  and if effective will send in as rx. He will let me know if it is effective

## 2016-01-09 NOTE — Assessment & Plan Note (Signed)
S: much improved control compared to last visit. Taking gabapentin BID consistently and really seems to help A/P: continue BID gabapentin

## 2016-01-09 NOTE — Progress Notes (Signed)
Larry Reddish, MD Phone: [phone]  Subjective:  Patient presents today for their annual wellness visit.    Preventive Screening-Counseling & Management  Smoking Status: former Smoker. Needs AAA screen. Does not qualify for lung cancer screening Second Hand Smoking status: No smokers in home  Risk Factors Regular exercise: 1x a week walking if that Diet: weight up 2 lbs from last visit- needs to work on healthy eating and regular exercise Wt Readings from Last 3 Encounters:  01/09/16 233 lb (105.688 kg)  07/13/15 231 lb (104.781 kg)  04/12/15 232 lb (105.235 kg)   Fall Risk: None   Cardiac risk factors:  advanced age (older than 73 for men, 40 for women)  Hyperlipidemia yes- restart pravastatin No diabetes.  Lab Results  Component Value Date   HGBA1C 5.4 03/15/2010   Depression Screen None. PHQ2 0   Activities of Daily Living Independent ADLs and IADLs   Hearing Difficulties: -patient declines  Cognitive Testing No reported trouble.   Normal 3 word recall  List the Names of Other Physician/Practitioners you currently use: -Dr. Sabra Heck optho.   Immunization History  Administered Date(s) Administered  . Influenza Split 07/30/2011, 07/10/2012  . Influenza Whole 08/04/2007, 07/21/2008, 07/18/2009, 07/16/2010  . Influenza,inj,Quad PF,36+ Mos 07/05/2013, 08/03/2014, 07/13/2015  . Pneumococcal Conjugate-13 01/09/2016  . Td 10/22/1999, 12/11/2009   Required Immunizations needed today given Prevnar 13 today, discussed shingles vaccine Health Maintenance Due  Topic Date Due  . ZOSTAVAX  08/11/2010   Screening tests- up to date other than AAA screen as noted above  Advanced directives: wife HCPOA and has living will. Can bring these by for chart in future  Visual auity Rt 20/20 and left eye 20/20  And bilateral 20/13  ROS- No pertinent positives discovered in course of AWV Pertinent ROS for problem oriented- No chest pain or shortness of breath.  No headache or blurry vision.   The following were reviewed and entered/updated in epic: Past Medical History  Diagnosis Date  . GERD (gastroesophageal reflux disease)   . Hypertension   . Anemia   . Hyperlipidemia   . Diverticulitis of colon   . ALLERGIC RHINITIS   . Ulcer 1992    gastric  . Adenomatous colon polyp 06/2006  . ANEMIA DUE TO DIETARY IRON DEFICIENCY 05/28/2007    Due to giving regularly giving blood. Resolved with cutting in half.     [name] DIVERTICULOSIS, COLON 05/28/2007    Qualifier: Diagnosis of  By: Arnoldo Morale MD, [place], THORACIC VERTEBRA 03/09/2008    2009, no chronic pain    Patient Active Problem List   Diagnosis Date Noted  . Meralgia paresthetica 12/28/2014    Priority: Medium  . Hyperlipidemia 05/15/2007    Priority: Medium  . Essential hypertension 05/15/2007    Priority: Medium  . History of skin cancer 12/28/2014    Priority: Low  . Former smoker 12/28/2014    Priority: Low  . Abnormal liver enzymes 12/29/2010    Priority: Low  . Allergic rhinitis 06/17/2008    Priority: Low  . ERECTILE DYSFUNCTION 12/07/2007    Priority: Low  . History of colonic polyps 12/07/2007    Priority: Low  . Umbilical hernia [id number]    Priority: Low  . GERD 05/15/2007    Priority: Low   Past Surgical History  Procedure Laterality Date  . Foot surgery      pain scraper several inches into foot  . Colonoscopy w/ polypectomy  Family History  Problem Relation Age of Onset  . Cancer Father     lung, former smoker[place] lung [place]  . Hemochromatosis Brother   . Dementia Mother   . Colon cancer Neg Hx     Medications- reviewed and updated Current Outpatient Prescriptions  Medication Sig Dispense Refill  . aspirin 81 MG tablet Take 81 mg by mouth daily.      . Folic Acid A999333 MCG TABS Take 2 tablets by mouth daily.      [name] gabapentin (NEURONTIN) 100 MG capsule take 1 capsule by mouth at bedtime for 1 week then INCREASE TO 1 capsule  twice a day ( 1 AT DINNER and 1 at bedtime ) 60 capsule 3  . MULTIPLE VITAMIN PO Take by mouth daily.      . Omega-3 Fatty Acids (FISH OIL) 1000 MG CAPS Take 2 capsules by mouth 2 (two) times daily.      [name] omeprazole (PRILOSEC) 40 MG capsule Take 1 capsule (40 mg total) by mouth daily. 30 capsule 11  . pravastatin (PRAVACHOL) 40 MG tablet Take 1 tablet (40 mg total) by mouth daily. 30 tablet 11  . valsartan (DIOVAN) 320 MG tablet Take 1 tablet (320 mg total) by mouth daily. 30 tablet 5  . tadalafil (CIALIS) 20 MG tablet Take 0.5-1 tablets (10-20 mg total) by mouth every other day as needed for erectile dysfunction. (Patient not taking: Reported on 01/09/2016) 5 tablet 11   No current facility-administered medications for this visit.    Allergies-reviewed and updated Allergies  Allergen Reactions  . Ace Inhibitors     REACTION: cough    Social History   Social History  . Marital Status: Married    Spouse Name: N/A  . Number of Children: N/A  . Years of Education: N/A   Social History Main Topics  . Smoking status: Former Smoker -- 1.00 packs/day for 15 years    Types: Cigarettes    Quit date: 10/21/1978  . Smokeless tobacco: Never Used  . Alcohol Use: No     Comment: Quit drinking 2006  . Drug Use: No  . Sexual Activity: Yes   Other Topics Concern  . None   Social History Narrative   Married (wife pt of Dr. Maudie Mercury). 5 children (2 by first wife, 3 stepkids), 11 granddaughters + 2 greatgrandsons.       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 Oropharynx normal CV: RRR no murmurs rubs or gallops Lungs: CTAB no crackles, wheeze, rhonchi Abdomen: soft/nontender/nondistended/normal bowel sounds. No rebound or  guarding.  Ext: no edema Skin: warm, dry Neuro: grossly normal, moves all extremities, PERRLA  Assessment/Plan:  AWV completed- discussed recommended screenings anddocumented any personalized health advice and referrals for preventive counseling. See AVS as well which was given to patient.   Hyperlipidemia S: suspect poorly controlled on no medication- he never started his pravastatin due to cramps on medicine lipitor- he is anxious. minimal myalgias off medicine Lab Results  Component Value Date   CHOL 201* 07/06/2015   HDL 40.90 07/06/2015   LDLCALC 128* 07/06/2015   LDLDIRECT 85.0 12/21/2012   TRIG 162.0* 07/06/2015   CHOLHDL 5 07/06/2015  A/P: start pravastatin 40mg  daily- patient will reach out if he is having cramps. Would consider crestor 20mg  once a week .  Essential hypertension S: controlled. On valsartan 320mg   BP Readings from Last 3 Encounters:  01/09/16 126/72  07/13/15 122/64  04/12/15 134/72  A/P:Continue current meds:  Doing well  GERD S: controlled on Omeprazole 40mg . Symptoms return if misses even one dose. Takes in morning A/P: we will trial OTC 20mg  and if effective will send in as rx. He will let me know if it is effective   Meralgia paresthetica S: much improved control compared to last visit. Taking gabapentin BID consistently and really seems to help A/P: continue BID gabapentin   Return in about 6 months (around 07/11/2016) for physical. Return precautions advised.   Orders Placed This Encounter  Procedures  . Pneumococcal conjugate vaccine 13-valent   AAA screen ordered- not sure why not visible in orders No orders of the defined types were placed in this encounter.

## 2016-01-09 NOTE — Assessment & Plan Note (Signed)
S: suspect poorly controlled on no medication- he never started his pravastatin due to cramps on medicine lipitor- he is anxious. minimal myalgias off medicine Lab Results  Component Value Date   CHOL 201* 07/06/2015   HDL 40.90 07/06/2015   LDLCALC 128* 07/06/2015   LDLDIRECT 85.0 12/21/2012   TRIG 162.0* 07/06/2015   CHOLHDL 5 07/06/2015   A/P: start pravastatin 40mg  daily- patient will reach out if he is having cramps. Would consider crestor 20mg  once a week .

## 2016-01-09 NOTE — Patient Instructions (Addendum)
[id number] received today.  Mr. Larry Williamson , Thank you for taking time to come for your Medicare Wellness Visit. I appreciate your ongoing commitment to your health goals. Please review the following plan we discussed and let me know if I can assist you in the future.   These are the goals we discussed: 1. start pravastatin 40mg  daily- patient will reach out if he is having cramps. Would consider crestor 20mg  once a week . 2.  Call your insurace about the shingles shot to see if it is covered or how much it would cost and where is cheaper (here or pharmacy).  If you want to receive here, call for nurse visit. 3. Trial omeprazole/prilosec 20mg - buy over the counter. If this works for you then we will prescribe this for you in the future. The lowest effective dose is best for this.  4. [employer] 150 minutes exercise each week. Start 5-10 minutes a day walking and build up to 30 minutes 5 days a week. Target 3-5 servings of vegetables a day. Water only to drink (coffee ok as well) 5. Yearly physical and we will complete labwork at that time 6. We will call you within a week about your referral for ultrasound of your aorta to make sure no aneurysm given you ar ea former smoker. If you do not hear within 2 weeks, give [place] a call.     This is a list of the screening recommended for you and due dates:  Health Maintenance  Topic Date Due  . Shingles Vaccine  08/11/2010  .  Hepatitis C: One time screening is recommended by Center for Disease Control  (CDC) for  adults born from [birth year] through 1966.   10/17/2098*  . Flu Shot  05/21/2016  . Colon Cancer Screening  11/04/2016  . Pneumonia vaccines (2 of 2 - PPSV23) 01/08/2017  . Tetanus Vaccine  12/12/2019  . HIV Screening  Completed  *Topic was postponed. The date shown is not the original due date.

## 2016-01-12 ENCOUNTER — Inpatient Hospital Stay (HOSPITAL_COMMUNITY): Admission: RE | Admit: 2016-01-12 | Payer: Medicare Other | Source: Ambulatory Visit

## 2016-01-17 ENCOUNTER — Ambulatory Visit (HOSPITAL_COMMUNITY)
Admission: RE | Admit: 2016-01-17 | Discharge: 2016-01-17 | Disposition: A | Discharge status: Home / Self Care | Payer: Medicare Other | Source: Ambulatory Visit | Attending: Cardiovascular Disease | Admitting: Cardiovascular Disease

## 2016-01-17 DIAGNOSIS — I700 Atherosclerosis of aorta: Secondary | ICD-10-CM | POA: Insufficient documentation

## 2016-01-17 DIAGNOSIS — I708 Atherosclerosis of other arteries: Secondary | ICD-10-CM | POA: Diagnosis not present

## 2016-01-17 DIAGNOSIS — I714 Abdominal aortic aneurysm, without rupture, unspecified: Secondary | ICD-10-CM

## 2016-01-17 DIAGNOSIS — Z87891 Personal history of nicotine dependence: Secondary | ICD-10-CM

## 2016-01-18 DIAGNOSIS — I714 Abdominal aortic aneurysm, without rupture, unspecified: Secondary | ICD-10-CM | POA: Insufficient documentation

## 2016-01-18 DIAGNOSIS — H25813 Combined forms of age-related cataract, bilateral: Secondary | ICD-10-CM | POA: Diagnosis not present

## 2016-01-18 DIAGNOSIS — H35033 Hypertensive retinopathy, bilateral: Secondary | ICD-10-CM | POA: Diagnosis not present

## 2016-03-04 ENCOUNTER — Other Ambulatory Visit: Payer: Self-pay

## 2016-03-04 MED ORDER — VALSARTAN 320 MG PO TABS: 320.0000 mg | ORAL_TABLET | Freq: Every day | ORAL | Status: DC

## 2016-05-28 ENCOUNTER — Other Ambulatory Visit: Payer: Self-pay | Admitting: Family Medicine

## 2016-07-16 ENCOUNTER — Ambulatory Visit (INDEPENDENT_AMBULATORY_CARE_PROVIDER_SITE_OTHER): Payer: Medicare Other | Admitting: Family Medicine

## 2016-07-16 DIAGNOSIS — Z23 Encounter for immunization: Secondary | ICD-10-CM | POA: Diagnosis not present

## 2016-08-14 DIAGNOSIS — L821 Other seborrheic keratosis: Secondary | ICD-10-CM | POA: Diagnosis not present

## 2016-08-14 DIAGNOSIS — D239 Other benign neoplasm of skin, unspecified: Secondary | ICD-10-CM | POA: Diagnosis not present

## 2016-08-14 DIAGNOSIS — L570 Actinic keratosis: Secondary | ICD-10-CM | POA: Diagnosis not present

## 2016-09-02 ENCOUNTER — Other Ambulatory Visit: Payer: Self-pay | Admitting: Family Medicine

## 2016-09-04 ENCOUNTER — Other Ambulatory Visit: Payer: Self-pay

## 2016-09-04 MED ORDER — OMEPRAZOLE 40 MG PO CPDR: 40.0000 mg | DELAYED_RELEASE_CAPSULE | Freq: Every day | ORAL | 3 refills | Status: DC

## 2016-09-09 ENCOUNTER — Other Ambulatory Visit: Payer: Self-pay

## 2016-09-09 MED ORDER — VALSARTAN 320 MG PO TABS: 320.0000 mg | ORAL_TABLET | Freq: Every day | ORAL | 1 refills | Status: DC

## 2016-09-11 ENCOUNTER — Encounter: Payer: Self-pay | Admitting: Gastroenterology

## 2016-09-23 ENCOUNTER — Other Ambulatory Visit: Payer: Self-pay | Admitting: Family Medicine

## 2016-09-25 ENCOUNTER — Telehealth: Payer: Self-pay | Admitting: Family Medicine

## 2016-09-26 ENCOUNTER — Other Ambulatory Visit (INDEPENDENT_AMBULATORY_CARE_PROVIDER_SITE_OTHER): Payer: Medicare Other

## 2016-09-26 DIAGNOSIS — Z125 Encounter for screening for malignant neoplasm of prostate: Secondary | ICD-10-CM | POA: Diagnosis not present

## 2016-09-26 DIAGNOSIS — E785 Hyperlipidemia, unspecified: Secondary | ICD-10-CM

## 2016-09-26 LAB — LIPID PANEL
CHOL/HDL RATIO: 4
Cholesterol: 164 mg/dL (ref 0–200)
HDL: 41.1 mg/dL (ref >39.00)
LDL Cholesterol: 88 mg/dL (ref 0–99)
NONHDL: 122.99
TRIGLYCERIDES: 176 mg/dL — AB (ref 0.0–149.0)
VLDL: 35.2 mg/dL (ref 0.0–40.0)

## 2016-09-26 LAB — PSA: PSA: 1.15 ng/mL (ref 0.10–4.00)

## 2016-09-26 LAB — CBC WITH DIFFERENTIAL/PLATELET
BASOS PCT: 0.8 % (ref 0.0–3.0)
Basophils Absolute: 0 10*3/uL (ref 0.0–0.1)
EOS ABS: 0.2 10*3/uL (ref 0.0–0.7)
EOS PCT: 2.9 % (ref 0.0–5.0)
HEMATOCRIT: 39.5 % (ref 39.0–52.0)
HEMOGLOBIN: 13.5 g/dL (ref 13.0–17.0)
LYMPHS PCT: 40.9 % (ref 12.0–46.0)
Lymphs Abs: 2.6 10*3/uL (ref 0.7–4.0)
MCHC: 34.3 g/dL (ref 30.0–36.0)
MCV: 82.7 fl (ref 78.0–100.0)
Monocytes Absolute: 0.6 10*3/uL (ref 0.1–1.0)
Monocytes Relative: 8.8 % (ref 3.0–12.0)
Neutro Abs: 3 10*3/uL (ref 1.4–7.7)
Neutrophils Relative %: 46.6 % (ref 43.0–77.0)
Platelets: 191 10*3/uL (ref 150.0–400.0)
RBC: 4.77 Mil/uL (ref 4.22–5.81)
RDW: 14.3 % (ref 11.5–15.5)
WBC: 6.5 10*3/uL (ref 4.0–10.5)

## 2016-09-26 LAB — BASIC METABOLIC PANEL
BUN: 18 mg/dL (ref 6–23)
CHLORIDE: 103 meq/L (ref 96–112)
CO2: 28 mEq/L (ref 19–32)
CREATININE: 1.23 mg/dL (ref 0.40–1.50)
Calcium: 9.5 mg/dL (ref 8.4–10.5)
GFR: 62.55 mL/min (ref >60.00)
Glucose, Bld: 102 mg/dL — ABNORMAL HIGH (ref 70–99)
POTASSIUM: 5.1 meq/L (ref 3.5–5.1)
Sodium: 140 mEq/L (ref 135–145)

## 2016-09-26 LAB — POC URINALSYSI DIPSTICK (AUTOMATED)
Bilirubin, UA: NEGATIVE
Glucose, UA: NEGATIVE
Ketones, UA: NEGATIVE
Leukocytes, UA: NEGATIVE
NITRITE UA: NEGATIVE
PH UA: 6.5
PROTEIN UA: NEGATIVE
RBC UA: NEGATIVE
SPEC GRAV UA: 1.01
UROBILINOGEN UA: 0.2

## 2016-09-26 LAB — HEPATIC FUNCTION PANEL
ALT: 49 U/L (ref 0–53)
AST: 31 U/L (ref 0–37)
Albumin: 4.3 g/dL (ref 3.5–5.2)
Alkaline Phosphatase: 33 U/L — ABNORMAL LOW (ref 39–117)
BILIRUBIN DIRECT: 0.1 mg/dL (ref 0.0–0.3)
TOTAL PROTEIN: 7.2 g/dL (ref 6.0–8.3)
Total Bilirubin: 0.8 mg/dL (ref 0.2–1.2)

## 2016-10-01 ENCOUNTER — Encounter: Payer: Self-pay | Admitting: Family Medicine

## 2016-10-01 ENCOUNTER — Ambulatory Visit (INDEPENDENT_AMBULATORY_CARE_PROVIDER_SITE_OTHER): Payer: Medicare Other | Admitting: Family Medicine

## 2016-10-01 VITALS — BP 138/72 | HR 79 | Temp 97.8°F | Ht 69.25 in | Wt 233.0 lb

## 2016-10-01 DIAGNOSIS — G5710 Meralgia paresthetica, unspecified lower limb: Secondary | ICD-10-CM

## 2016-10-01 DIAGNOSIS — E785 Hyperlipidemia, unspecified: Secondary | ICD-10-CM | POA: Diagnosis not present

## 2016-10-01 DIAGNOSIS — R739 Hyperglycemia, unspecified: Secondary | ICD-10-CM | POA: Diagnosis not present

## 2016-10-01 DIAGNOSIS — I10 Essential (primary) hypertension: Secondary | ICD-10-CM | POA: Diagnosis not present

## 2016-10-01 DIAGNOSIS — Z0000 Encounter for general adult medical examination without abnormal findings: Secondary | ICD-10-CM

## 2016-10-01 DIAGNOSIS — K219 Gastro-esophageal reflux disease without esophagitis: Secondary | ICD-10-CM

## 2016-10-01 NOTE — Assessment & Plan Note (Signed)
Mild at 102- discussed weight loss. Reviewed his diet- a lot of carbs and not many veggies- work on fixing ratio and upping exercise as moving into semi retirement

## 2016-10-01 NOTE — Assessment & Plan Note (Signed)
HLD at goal on pravastatin 40mg  with LDL <100

## 2016-10-01 NOTE — Assessment & Plan Note (Signed)
HTN- at goal per JNC 8 on valsartan 320 mg. Discussed newer guidelines- encouraged to push for this with weight loss and dash type eating plan  Wt Readings from Last 3 Encounters:  10/01/16 233 lb (105.7 kg)  01/09/16 233 lb (105.7 kg)  07/13/15 231 lb (104.8 kg)

## 2016-10-01 NOTE — Assessment & Plan Note (Signed)
Meralgia paresthetica - BID gabapentin helps even at 100mg 

## 2016-10-01 NOTE — Assessment & Plan Note (Signed)
GERD- trial 20mg  omeprazole OTC instead of 40mg  presription rx - failed this- intensification of

## 2016-10-01 NOTE — Progress Notes (Signed)
Phone: [phone]  Subjective:  Patient presents today for their annual physical. Chief complaint-noted.   See problem oriented charting- ROS- full  review of systems was completed and negative except for: umbilical hernia- minimal pain though. Reflux if on lower dose ppi  The following were reviewed and entered/updated in epic: Past Medical History:  Diagnosis Date  . Adenomatous colon polyp 06/2006  . ALLERGIC RHINITIS   . Anemia   . ANEMIA DUE TO DIETARY IRON DEFICIENCY 05/28/2007   Due to giving regularly giving blood. Resolved with cutting in half.     . COMPRESSION FRACTURE, THORACIC VERTEBRA 03/09/2008   2009, no chronic pain   . Diverticulitis of colon   . DIVERTICULOSIS, COLON 05/28/2007   Qualifier: Diagnosis of  By: Arnoldo Morale MD, Balinda Quails   . GERD (gastroesophageal reflux disease)   . Hyperlipidemia   . Hypertension   . Ulcer ([place]) 1992   gastric   Patient Active Problem List   Diagnosis Date Noted  . AAA (abdominal aortic aneurysm) ([place]) 01/18/2016    Priority: High  . Hyperglycemia 10/01/2016    Priority: Medium  . Meralgia paresthetica 12/28/2014    Priority: Medium  . Hyperlipidemia 05/15/2007    Priority: Medium  . Essential hypertension 05/15/2007    Priority: Medium  . History of skin cancer 12/28/2014    Priority: Low  . Former smoker 12/28/2014    Priority: Low  . Abnormal liver enzymes 12/29/2010    Priority: Low  . Allergic rhinitis 06/17/2008    Priority: Low  . ERECTILE DYSFUNCTION 12/07/2007    Priority: Low  . History of colonic polyps 12/07/2007    Priority: Low  . Umbilical hernia [id number]    Priority: Low  . GERD 05/15/2007    Priority: Low   Past Surgical History:  Procedure Laterality Date  . COLONOSCOPY W/ POLYPECTOMY    . FOOT SURGERY     pain scraper several inches into foot    Family History  Problem Relation Age of Onset  . Cancer Father     lung, former smoker, [place] lung [place]  . Hemochromatosis Brother   .  Dementia Mother   . Colon cancer Neg Hx     Medications- reviewed and updated Current Outpatient Prescriptions  Medication Sig Dispense Refill  . aspirin 81 MG tablet Take 81 mg by mouth daily.      . Folic Acid A999333 MCG TABS Take 2 tablets by mouth daily.      [name] gabapentin (NEURONTIN) 100 MG capsule take 1 capsule by mouth at bedtime for 1 week then 1 capsule by mouth twice a day (1 AT [facility] AND 1 AT BEDTIME) 60 capsule 3  . MULTIPLE VITAMIN PO Take by mouth daily.      . Omega-3 Fatty Acids (FISH OIL) 1000 MG CAPS Take 2 capsules by mouth 2 (two) times daily.      [name] omeprazole (PRILOSEC) 40 MG capsule Take 1 capsule (40 mg total) by mouth daily. 90 capsule 3  . pravastatin (PRAVACHOL) 40 MG tablet take 1 tablet by mouth once daily 90 tablet 1  . tadalafil (CIALIS) 20 MG tablet Take 0.5-1 tablets (10-20 mg total) by mouth every other day as needed for erectile dysfunction. 5 tablet 11  . valsartan (DIOVAN) 320 MG tablet Take 1 tablet (320 mg total) by mouth daily. 90 tablet 1   No current facility-administered medications for this visit.     Allergies-reviewed and updated Allergies  Allergen Reactions  .  Ace Inhibitors     REACTION: cough    Social History   Social History  . Marital status: Married    Spouse name: N/A  . Number of children: N/A  . Years of education: N/A   Social History Main Topics  . Smoking status: Former Smoker    Packs/day: 1.00    Years: 15.00    Types: Cigarettes    Quit date: 10/21/1978  . Smokeless tobacco: Never Used  . Alcohol use No     Comment: Quit drinking 2006  . Drug use: No  . Sexual activity: Yes   Other Topics Concern  . None   Social History Narrative   Married (wife pt of Dr. Maudie Mercury). 5 children (2 by first wife, 3 stepkids), 11 granddaughters + 2 greatgrandsons.       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 Oropharynx normal Neck: no thyromegaly CV: RRR no murmurs rubs or gallops Lungs: CTAB no crackles, wheeze, rhonchi Abdomen: soft/nontender/nondistended/normal bowel sounds. No rebound or guarding. Obese. Umbilical hernia noted Ext: no edema Skin: warm, dry Neuro: grossly normal, moves all extremities, PERRLA Rectal: normal tone, normal sized prostate, no masses or tenderness   Assessment/Plan:  65 y.o. male presenting for annual physical.  Health Maintenance counseling: 1. Anticipatory guidance: Patient counseled regarding regular dental exams, eye exams, wearing seatbelts.  2. Risk factor reduction:  Advised patient of need for regular exercise and diet rich and fruits and vegetables to reduce risk of heart attack and stroke.  3. Immunizations/screenings/ancillary studies Immunization History  Administered Date(s) Administered  . Influenza Split 07/30/2011, 07/10/2012  . Influenza Whole 08/04/2007, 07/21/2008, 07/18/2009, 07/16/2010  . Influenza, High Dose Seasonal PF 07/16/2016  . Influenza,inj,Quad PF,36+ Mos 07/05/2013, 08/03/2014, 07/13/2015  . Pneumococcal Conjugate-13 01/09/2016  . Td 10/22/1999, 12/11/2009   Health Maintenance Due  Topic Date Due  . ZOSTAVAX - hold off until next year 08/11/2010   4. Prostate cancer screening- low risk based off PSA trend and rectal exam   Lab Results  Component Value Date   PSA 1.15 09/26/2016   PSA 1.08 07/06/2015   PSA 1.00 07/04/2014   5. Colon cancer screening - repeat colonoscopy in 2018 due to adenoma 2007, though normal in 2013 6. Skin cancer screening- Sees Dr. Jerrell Belfast yearly  Status of chronic or acute concerns   Hyperglycemia Mild at  102- discussed weight loss. Reviewed his diet- a lot of carbs and not many veggies- work on fixing ratio and upping exercise as moving into semi retirement  Essential hypertension HTN- at goal per JNC 8 on valsartan 320 mg. Discussed newer guidelines- encouraged to push for this with weight loss and dash type eating plan  Wt Readings from Last 3 Encounters:  10/01/16 233 lb (105.7 kg)  01/09/16 233 lb (105.7 kg)  07/13/15 231 lb (104.8 kg)     Hyperlipidemia HLD at goal on pravastatin 40mg  with LDL <100  GERD GERD- trial 20mg  omeprazole OTC instead of 40mg  presription rx - failed this- intensification of  Meralgia paresthetica Meralgia paresthetica - BID gabapentin helps even at 100mg   6 months AWV with susan then see me in a year as long as BP <140/90  Return precautions advised.   Garret Reddish, MD

## 2016-10-01 NOTE — Progress Notes (Signed)
Pre visit review using our clinic review tool, if applicable. No additional management support is needed unless otherwise documented below in the visit note. 

## 2016-10-01 NOTE — Patient Instructions (Signed)
Sugar slightly high- at risk for diabetes- weight loss and healthy eating advised  I would also like for you to sign up for an annual wellness visit with our nurse, Manuela Schwartz, in 6 months who specializes in the annual wellness exam. This is a free benefit under medicare that may help [place] find additional ways to help you. Some highlights are reviewing medications, lifestyle, and doing a dementia screen. This will also give me a chance to see your weight and blood pressure. Would love for you to be under 215 by follow up. Focus on bumping veggies up and reducing carbs some.  Long term BP goal <130/80 with weight loss- no change in meds as long as under 140/90

## 2016-10-21 HISTORY — PX: POLYPECTOMY: SHX149

## 2016-11-20 ENCOUNTER — Other Ambulatory Visit: Payer: Self-pay | Admitting: Family Medicine

## 2016-11-27 ENCOUNTER — Encounter: Payer: Self-pay | Admitting: Gastroenterology

## 2016-12-10 ENCOUNTER — Encounter: Payer: Self-pay | Admitting: Family Medicine

## 2016-12-10 ENCOUNTER — Telehealth: Payer: Self-pay | Admitting: Family Medicine

## 2016-12-10 ENCOUNTER — Ambulatory Visit (INDEPENDENT_AMBULATORY_CARE_PROVIDER_SITE_OTHER): Payer: Medicare Other | Admitting: Family Medicine

## 2016-12-10 VITALS — BP 132/70 | HR 71 | Temp 98.2°F | Ht 69.0 in | Wt 234.3 lb

## 2016-12-10 DIAGNOSIS — J111 Influenza due to unidentified influenza virus with other respiratory manifestations: Secondary | ICD-10-CM | POA: Diagnosis not present

## 2016-12-10 MED ORDER — HYDROCODONE-HOMATROPINE 5-1.5 MG/5ML PO SYRP: 5.0000 mL | ORAL_SOLUTION | Freq: Three times a day (TID) | ORAL | 0 refills | Status: DC | PRN

## 2016-12-10 NOTE — Patient Instructions (Signed)
Rest at home  Drink lots of liquids  Tylenol.......... 2 tabs 3 times daily for fever chills  Hydromet.......[name] 1/2-1 teaspoon 3 times daily. For cough and cold symptoms  Return when necessary

## 2016-12-10 NOTE — Telephone Encounter (Signed)
Patient Name: Larry Williamson DOB: July 01, 1951 Initial Comment Caller states that he is having hot and cold flashes and weakness, fever of 99.9 Nurse Assessment Nurse: Renie Ora, RN, Ashby Dawes Date/Time (Eastern Time): 12/10/2016 10:04:06 AM Confirm and document reason for call. If symptomatic, describe symptoms. ---Caller states that last night he started with symptoms of "freezing." Temp is fluctuating between 97-99. States that the "freezing spells" have gone away. States that he just feels weak and tired today. Does the patient have any new or worsening symptoms? ---Yes Will a triage be completed? ---Yes Related visit to physician within the last 2 weeks? ---No Does the PT have any chronic conditions? (i.e. diabetes, asthma, etc.) ---No Is this a behavioral health or substance abuse call? ---No Guidelines Guideline Title Affirmed Question Affirmed Notes Weakness (Generalized) and Fatigue [1] MODERATE weakness (i.e., interferes with work, school, normal activities) AND [2] cause unknown (Exceptions: weakness with acute minor illness, or weakness from poor fluid intake) Final Disposition User See Physician within 4 Hours (or PCP triage) Renie Ora, RN, Jeanine Comments Caller states he has moderate weakness and fatigue today. Was unable to go to work because of it. Denies any other symptoms. No appt avialable with PCP. Appt schedule for 1130 with Dr. Sherren Mocha. Referrals REFERRED TO PCP OFFICE

## 2016-12-10 NOTE — Progress Notes (Signed)
Pre visit review using our clinic review tool, if applicable. No additional management support is needed unless otherwise documented below in the visit note. 

## 2016-12-10 NOTE — Progress Notes (Signed)
Larry Williamson is a 66 year old male nonsmoker who comes in today with a 12 hour history fever chills and cough  Study felt well until last night when he developed some fever chills slight cough. No earache sore throat nausea vomiting or diarrhea. No skin rash. No urinary tract symptoms.  Does not recall being around anybody it's been ill. He did have the flu shot last fall  Review of systems otherwise negative  BP 132/70 (BP Location: Right Arm, Patient Position: Sitting, Cuff Size: Normal)   Pulse 71   Temp 98.2 F (36.8 C) (Oral)   Ht 5\' 9"  (1.753 m)   Wt 234 lb 4.8 oz (106.3 kg)   BMI 34.60 kg/m  Examination HEENT were negative neck was supple thyroid not enlarged  Cardiopulmonary exam normal  Impression flu............ treat symptomatically Tylenol liquids and Hydromet

## 2016-12-10 NOTE — Telephone Encounter (Signed)
Pt being seen by Dr Sherren Mocha this morning.

## 2017-01-19 HISTORY — PX: COLONOSCOPY: SHX174

## 2017-01-21 ENCOUNTER — Encounter: Payer: Self-pay | Admitting: Gastroenterology

## 2017-01-21 ENCOUNTER — Ambulatory Visit (AMBULATORY_SURGERY_CENTER): Payer: Self-pay

## 2017-01-21 VITALS — Ht 70.0 in | Wt 231.2 lb

## 2017-01-21 DIAGNOSIS — Z86010 Personal history of colonic polyps: Secondary | ICD-10-CM

## 2017-01-21 MED ORDER — NA SULFATE-K SULFATE-MG SULF 17.5-3.13-1.6 GM/177ML PO SOLN: 1.0000 | Freq: Once | ORAL | 0 refills | Status: AC

## 2017-01-21 NOTE — Progress Notes (Signed)
Denies allergies to eggs or soy products. Denies complication of anesthesia or sedation. Denies use of weight loss medication. Denies use of O2.   Emmi instructions declined.  

## 2017-02-04 ENCOUNTER — Ambulatory Visit (AMBULATORY_SURGERY_CENTER): Payer: Medicare Other | Admitting: Gastroenterology

## 2017-02-04 ENCOUNTER — Encounter: Payer: Self-pay | Admitting: Gastroenterology

## 2017-02-04 VITALS — BP 142/76 | HR 71 | Temp 95.5°F | Resp 14 | Ht 69.0 in | Wt 234.0 lb

## 2017-02-04 DIAGNOSIS — D122 Benign neoplasm of ascending colon: Secondary | ICD-10-CM | POA: Diagnosis not present

## 2017-02-04 DIAGNOSIS — Z86010 Personal history of colonic polyps: Secondary | ICD-10-CM

## 2017-02-04 DIAGNOSIS — D120 Benign neoplasm of cecum: Secondary | ICD-10-CM

## 2017-02-04 MED ORDER — SODIUM CHLORIDE 0.9 % IV SOLN: 500.0000 mL | INTRAVENOUS | Status: DC

## 2017-02-04 NOTE — Progress Notes (Signed)
Report to PACU, RN, vss, BBS= Clear.  

## 2017-02-04 NOTE — Progress Notes (Signed)
Pt's states no medical or surgical changes since previsit or office visit.  No egg or soy allergy  

## 2017-02-04 NOTE — Patient Instructions (Signed)
YOU HAD AN ENDOSCOPIC PROCEDURE TODAY AT [facility]:   Refer to the procedure report that was given to you for any specific questions about what was found during the examination.  If the procedure report does not answer your questions, please call your gastroenterologist to clarify.  If you requested that your care partner not be given the details of your procedure findings, then the procedure report has been included in a sealed envelope for you to review at your convenience later.  YOU SHOULD EXPECT: Some feelings of bloating in the abdomen. Passage of more gas than usual.  Walking can help get rid of the air that was put into your GI tract during the procedure and reduce the bloating. If you had a lower endoscopy (such as a colonoscopy or flexible sigmoidoscopy) you may notice spotting of blood in your stool or on the toilet paper. If you underwent a bowel prep for your procedure, you may not have a normal bowel movement for a few days.  Please Note:  You might notice some irritation and congestion in your nose or some drainage.  This is from the oxygen used during your procedure.  There is no need for concern and it should clear up in a day or so.  SYMPTOMS TO REPORT IMMEDIATELY:   Following lower endoscopy (colonoscopy or flexible sigmoidoscopy):  Excessive amounts of blood in the stool  Significant tenderness or worsening of abdominal pains  Swelling of the abdomen that is new, acute  Fever of 100F or higher   For urgent or emergent issues, a gastroenterologist can be reached at any hour by calling [phone].   DIET:  We do recommend a small meal at first, but then you may proceed to your regular diet.  Drink plenty of fluids but you should avoid alcoholic beverages for 24 hours. Try to increase the fiber in your diet, and drink plenty of water.  ACTIVITY:  You should plan to take it easy for the rest of today and you should NOT DRIVE or use heavy machinery until  tomorrow (because of the sedation medicines used during the test).    FOLLOW UP: Our staff will call the number listed on your records the next business day following your procedure to check on you and address any questions or concerns that you may have regarding the information given to you following your procedure. If we do not reach you, we will leave a message.  However, if you are feeling well and you are not experiencing any problems, there is no need to return our call.  We will assume that you have returned to your regular daily activities without incident.  If any biopsies were taken you will be contacted by phone or by letter within the next 1-3 weeks.  Please call us at [phone] if you have not heard about the biopsies in 3 weeks.   You will need to come back for a repeat colonoscopy in 6 months.  We will send you a reminder.   SIGNATURES/CONFIDENTIALITY: You and/or your care partner have signed paperwork which will be entered into your electronic medical record.  These signatures attest to the fact that that the information above on your After Visit Summary has been reviewed and is understood.  Full responsibility of the confidentiality of this discharge information lies with you and/or your care-partner.  No NSAIDS for two weeks after procedure due to possibility of bleeding.

## 2017-02-04 NOTE — Progress Notes (Signed)
Called to room to assist during endoscopic procedure.  Patient ID and intended procedure confirmed with present staff. Received instructions for my participation in the procedure from the performing physician.  

## 2017-02-04 NOTE — Op Note (Signed)
[facility] Patient Name: Nidal Rivet Procedure Date: 02/04/2017 8:35 AM MRN: [id number] Endoscopist: Ladene Artist , MD Age: 66 Referring MD:  Date of Birth: 12/11/1950 Gender: Male Account #: [id number] Procedure:                Colonoscopy Indications:              Surveillance: Personal history of adenomatous                            polyps on last colonoscopy 5 years ago Medicines:                Monitored Anesthesia Care Procedure:                Pre-Anesthesia Assessment:                           - Prior to the procedure, a History and Physical                            was performed, and patient medications and                            allergies were reviewed. The patient's tolerance of                            previous anesthesia was also reviewed. The risks                            and benefits of the procedure and the sedation                            options and risks were discussed with the patient.                            All questions were answered, and informed consent                            was obtained. Prior Anticoagulants: The patient has                            taken no previous anticoagulant or antiplatelet                            agents. ASA Grade Assessment: II - A patient with                            mild systemic disease. After reviewing the risks                            and benefits, the patient was deemed in                            satisfactory condition to undergo the procedure.  After obtaining informed consent, the colonoscope                            was passed under direct vision. Throughout the                            procedure, the patient's blood pressure, pulse, and                            oxygen saturations were monitored continuously. The                            Colonoscope was introduced through the anus and                            advanced to the the cecum,  identified by                            appendiceal orifice and ileocecal valve. The                            ileocecal valve, appendiceal orifice, and rectum                            were photographed. The quality of the bowel                            preparation was adequate. The colonoscopy was                            performed without difficulty. The patient tolerated                            the procedure well. Scope In: 8:44:14 AM Scope Out: 8:59:35 AM Scope Withdrawal Time: 0 hours 13 minutes 7 seconds  Total Procedure Duration: 0 hours 15 minutes 21 seconds  Findings:                 The perianal and digital rectal examinations were                            normal.                           A 20 mm polyp was found in the cecum. The polyp was                            sessile. The polyp was removed with a piecemeal                            technique using a hot snare. Resection and                            retrieval were complete.  A 5 mm polyp was found in the ascending colon. The                            polyp was sessile. The polyp was removed with a                            cold biopsy forceps. Resection and retrieval were                            complete.                           Multiple medium-mouthed diverticula were found in                            the left colon. There was no evidence of                            diverticular bleeding.                           Internal hemorrhoids were found during                            retroflexion. The hemorrhoids were small and Grade                            I (internal hemorrhoids that do not prolapse).                           The exam was otherwise without abnormality on                            direct and retroflexion views. Complications:            No immediate complications. Estimated blood loss:                            None. Estimated Blood Loss:      Estimated blood loss: none. Impression:               - One 20 mm polyp in the cecum, removed piecemeal                            using a hot snare. Resected and retrieved.                           - One 5 mm polyp in the ascending colon, removed                            with a cold biopsy forceps. Resected and retrieved.                           - Moderate diverticulosis in the left colon. There  was no evidence of diverticular bleeding.                           - Internal hemorrhoids.                           - The examination was otherwise normal on direct                            and retroflexion views. Recommendation:           - Repeat colonoscopy in 6 months to assess                            piecemeal polypectomy site and for surveillance in                            3 years.                           - Patient has a contact number available for                            emergencies. The signs and symptoms of potential                            delayed complications were discussed with the                            patient. Return to normal activities tomorrow.                            Written discharge instructions were provided to the                            patient.                           - High fiber diet.                           - Continue present medications.                           - Await pathology results.                           - No aspirin, ibuprofen, naproxen, or other                            non-steroidal anti-inflammatory drugs for 2 weeks                            after polyp removal. Ladene Artist, MD 02/04/2017 9:04:31 AM This report has been signed electronically.

## 2017-02-05 ENCOUNTER — Telehealth: Payer: Self-pay

## 2017-02-05 NOTE — Telephone Encounter (Signed)
  Follow up Call-  Call back number 02/04/2017  Post procedure Call Back phone  # [phone]  Permission to leave phone message Yes  Some recent data might be hidden     Patient questions:  Do you have a fever, pain , or abdominal swelling? No. Pain Score  0 *  Have you tolerated food without any problems? Yes.    Have you been able to return to your normal activities? Yes.    Do you have any questions about your discharge instructions: Diet   No. Medications  No. Follow up visit  No.  Do you have questions or concerns about your Care? No.  Actions: * If pain score is 4 or above: No action needed, pain <4.

## 2017-02-13 ENCOUNTER — Encounter: Payer: Self-pay | Admitting: Gastroenterology

## 2017-03-04 ENCOUNTER — Other Ambulatory Visit: Payer: Self-pay | Admitting: Family Medicine

## 2017-04-01 ENCOUNTER — Encounter: Payer: Self-pay | Admitting: Family Medicine

## 2017-04-01 ENCOUNTER — Ambulatory Visit (INDEPENDENT_AMBULATORY_CARE_PROVIDER_SITE_OTHER): Payer: Medicare Other | Admitting: Family Medicine

## 2017-04-01 VITALS — BP 134/66 | HR 69 | Temp 98.1°F | Ht 69.0 in | Wt 233.8 lb

## 2017-04-01 DIAGNOSIS — R739 Hyperglycemia, unspecified: Secondary | ICD-10-CM

## 2017-04-01 DIAGNOSIS — I714 Abdominal aortic aneurysm, without rupture, unspecified: Secondary | ICD-10-CM

## 2017-04-01 DIAGNOSIS — Z23 Encounter for immunization: Secondary | ICD-10-CM

## 2017-04-01 DIAGNOSIS — K219 Gastro-esophageal reflux disease without esophagitis: Secondary | ICD-10-CM | POA: Diagnosis not present

## 2017-04-01 DIAGNOSIS — E785 Hyperlipidemia, unspecified: Secondary | ICD-10-CM | POA: Diagnosis not present

## 2017-04-01 DIAGNOSIS — I10 Essential (primary) hypertension: Secondary | ICD-10-CM

## 2017-04-01 DIAGNOSIS — G5710 Meralgia paresthetica, unspecified lower limb: Secondary | ICD-10-CM

## 2017-04-01 NOTE — Assessment & Plan Note (Signed)
S: last cbg elevated at 102. Had discussed weight loss but weight stable over 6 months. Went ot gym 4-5 miles walking in February but came down with the flu and had a hard time recovering.   Plans to start back at the gym. Skipping at lunch. Has been down to 198 before and thinks he can do it again A/P: goal 200 lbs within a year reaosnable. Coaching in 3 months through annual wellness visit. 6 month cpe

## 2017-04-01 NOTE — Assessment & Plan Note (Signed)
S: controlled on valsartan 320mg .  ASCVD 10 year risk calculation if age 67-79: on statin BP Readings from Last 3 Encounters:  04/01/17 134/66  02/04/17 (!) 142/76  12/10/16 132/70  A/P: We discussed blood pressure goal of <140/90. Continue current meds

## 2017-04-01 NOTE — Assessment & Plan Note (Signed)
gabapentin dinner and bedtime 100mg  controlling symptoms for most part

## 2017-04-01 NOTE — Assessment & Plan Note (Signed)
Aneurysm noted last year 12/2015 2.9 x 3.6 cm. Update AAA screen

## 2017-04-01 NOTE — Assessment & Plan Note (Signed)
S: reasonably controlled on pravastatin 40mg . No myalgias.  Lab Results  Component Value Date   CHOL 164 09/26/2016   HDL 41.10 09/26/2016   LDLCALC 88 09/26/2016   LDLDIRECT 85.0 12/21/2012   TRIG 176.0 (H) 09/26/2016   CHOLHDL 4 09/26/2016   A/P: work on weight loss,  Continue statin

## 2017-04-01 NOTE — Addendum Note (Signed)
Addended by: Lucianne Lei M on: 04/01/2017 02:13 PM   Modules accepted: Orders

## 2017-04-01 NOTE — Assessment & Plan Note (Signed)
S: we trialed 20mg  omeprazole last visit instead of 40mg  but he failed this attempt with worsening symptoms.  A/P: might be able to retrial reduction if gets weight to 200

## 2017-04-01 NOTE — Patient Instructions (Addendum)
Pneumovax 23 today  I would also like for you to sign up for an annual wellness visit in 3 months with our nurse, Manuela Schwartz, who specializes in the annual wellness exam. This is a free benefit under medicare that may help [place] find additional ways to help you. Some highlights are reviewing medications, lifestyle, and doing a dementia screen.  ____________________________________________________________________  Starting October 1st 2018, I will be transferring to our new location: [place] (corner of [address] from [employer]) [place], [place] Phone: [phone]  I would love to have you remain my patient at this new location as long as it remains convenient for you. I am excited about the opportunity to have x-ray and sports medicine in the new building but will really miss the awesome staff and physicians at [facility]. Continue to schedule appointments at [facility] and we will automatically transfer them to the [place] location starting October 1st.

## 2017-04-01 NOTE — Progress Notes (Signed)
Subjective:  Larry Williamson is a 66 y.o. year old very pleasant male patient who presents for/with See problem oriented charting ROS- No chest pain or shortness of breath. No headache or blurry vision. Some pain into both legs and some numbness- mainly controlled by gabapentin   Past Medical History-  Patient Active Problem List   Diagnosis Date Noted  . AAA (abdominal aortic aneurysm) ([place]) 01/18/2016    Priority: High  . Hyperglycemia 10/01/2016    Priority: Medium  . Meralgia paresthetica 12/28/2014    Priority: Medium  . Hyperlipidemia 05/15/2007    Priority: Medium  . Essential hypertension 05/15/2007    Priority: Medium  . History of skin cancer 12/28/2014    Priority: Low  . Former smoker 12/28/2014    Priority: Low  . Abnormal liver enzymes 12/29/2010    Priority: Low  . Allergic rhinitis 06/17/2008    Priority: Low  . ERECTILE DYSFUNCTION 12/07/2007    Priority: Low  . History of colonic polyps 12/07/2007    Priority: Low  . Umbilical hernia 78/29/5621    Priority: Low  . GERD 05/15/2007    Priority: Low    Medications- reviewed and updated Current Outpatient Prescriptions  Medication Sig Dispense Refill  . aspirin 81 MG tablet Take 81 mg by mouth daily.      . Folic Acid 308 MCG TABS Take 2 tablets by mouth daily.      [name] gabapentin (NEURONTIN) 100 MG capsule take 1 capsule by mouth at bedtime for 1 week then 1 capsule twice a day (1 CAPSULE AT DINNER AND 1 CAPSULE AT BEDTIME) 60 capsule 3  . HYDROcodone-homatropine (HYCODAN) 5-1.5 MG/5ML syrup Take 5 mLs by mouth every 8 (eight) hours as needed. 240 mL 0  . MULTIPLE VITAMIN PO Take by mouth daily.      . Omega-3 Fatty Acids (FISH OIL) 1000 MG CAPS Take 2 capsules by mouth 2 (two) times daily.      [name] omeprazole (PRILOSEC) 40 MG capsule Take 1 capsule (40 mg total) by mouth daily. 90 capsule 3  . pravastatin (PRAVACHOL) 40 MG tablet take 1 tablet by mouth once daily 90 tablet 1  . tadalafil (CIALIS) 20 MG  tablet Take 0.5-1 tablets (10-20 mg total) by mouth every other day as needed for erectile dysfunction. 5 tablet 11  . tizanidine (ZANAFLEX) 2 MG capsule Take 2 mg by mouth as needed for muscle spasms.    . valsartan (DIOVAN) 320 MG tablet Take 1 tablet (320 mg total) by mouth daily. 90 tablet 1   No current facility-administered medications for this visit.     Objective: BP 134/66 (BP Location: Left Arm, Patient Position: Sitting, Cuff Size: Large)   Pulse 69   Temp 98.1 F (36.7 C) (Oral)   Ht 5\' 9"  (1.753 m)   Wt 233 lb 12.8 oz (106.1 kg)   SpO2 96%   BMI 34.53 kg/m  Gen: NAD, resting comfortably CV: RRR no murmurs rubs or gallops Lungs: CTAB no crackles, wheeze, rhonchi Abdomen: obese Ext: no edema Skin: warm, dry  Assessment/Plan:  Hyperglycemia S: last cbg elevated at 102. Had discussed weight loss but weight stable over 6 months. Went ot gym 4-5 miles walking in February but came down with the flu and had a hard time recovering.   Plans to start back at the gym. Skipping at lunch. Has been down to 198 before and thinks he can do it again A/P: goal 200 lbs within a year reaosnable.  Coaching in 3 months through annual wellness visit. 6 month cpe   GERD S: we trialed 20mg  omeprazole last visit instead of 40mg  but he failed this attempt with worsening symptoms.  A/P: might be able to retrial reduction if gets weight to 200  Meralgia paresthetica  gabapentin dinner and bedtime 100mg  controlling symptoms for most part  Essential hypertension S: controlled on valsartan 320mg .  ASCVD 10 year risk calculation if age 23-79: on statin BP Readings from Last 3 Encounters:  04/01/17 134/66  02/04/17 (!) 142/76  12/10/16 132/70  A/P: We discussed blood pressure goal of <140/90. Continue current meds   Hyperlipidemia S: reasonably controlled on pravastatin 40mg . No myalgias.  Lab Results  Component Value Date   CHOL 164 09/26/2016   HDL 41.10 09/26/2016   LDLCALC 88  09/26/2016   LDLDIRECT 85.0 12/21/2012   TRIG 176.0 (H) 09/26/2016   CHOLHDL 4 09/26/2016   A/P: work on weight loss,  Continue statin  AAA (abdominal aortic aneurysm) ([place]) Aneurysm noted last year 12/2015 2.9 x 3.6 cm. Update AAA screen  3 month awv Manuela Schwartz Return in about 6 months (around 10/01/2017) for physical. come fasting. Pneumovax 23 today  Return precautions advised.  Garret Reddish, MD

## 2017-05-06 ENCOUNTER — Other Ambulatory Visit: Payer: Self-pay | Admitting: Family Medicine

## 2017-05-07 ENCOUNTER — Telehealth: Payer: Self-pay | Admitting: Family Medicine

## 2017-05-07 NOTE — Telephone Encounter (Signed)
Pharmacy is calling to get clarification for Rx gabapentin 100 MG.

## 2017-05-08 NOTE — Telephone Encounter (Signed)
Called pharmacy and clarified to take 1 capsule at bedtime

## 2017-05-12 ENCOUNTER — Ambulatory Visit (HOSPITAL_COMMUNITY)
Admission: RE | Admit: 2017-05-12 | Discharge: 2017-05-12 | Disposition: A | Discharge status: Home / Self Care | Payer: Medicare Other | Source: Ambulatory Visit | Attending: Cardiology | Admitting: Cardiology

## 2017-05-12 DIAGNOSIS — I714 Abdominal aortic aneurysm, without rupture, unspecified: Secondary | ICD-10-CM

## 2017-05-12 DIAGNOSIS — I700 Atherosclerosis of aorta: Secondary | ICD-10-CM | POA: Diagnosis not present

## 2017-05-28 ENCOUNTER — Encounter: Payer: Self-pay | Admitting: Family Medicine

## 2017-05-28 MED ORDER — IRBESARTAN 300 MG PO TABS: 300.0000 mg | ORAL_TABLET | Freq: Every day | ORAL | 3 refills | Status: DC

## 2017-07-01 NOTE — Progress Notes (Deleted)
Subjective:   Larry Williamson is a 66 y.o. male who presents for an Initial Medicare Annual Wellness Visit.  The Patient was informed that the wellness visit is to identify future health risk and educate and initiate measures that can reduce risk for increased disease through the lifespan.    Annual Wellness Assessment  Reports health as   Preventive Screening -Counseling & Management  Medicare Annual Preventive Care Visit - Subsequent Last OV 03/2017  Colonoscopy 01/2017   Describes Health as poor, fair, good or great?   VS reviewed;   Tobacco hx; AAA ordered 04/2017 ETOH quit 2006   Diet   BMI set goal of 200lbs  To note 233 lbs 6/12   Exercise     Cardiac Risk Factors Addressed Hyperlipidemia - chol 164; HDL 41; LDL 88; Trig 176 Diabetes neg  Obesity  Advanced Directives  Patient Care Team: Marin Olp, MD as PCP - General (Family Medicine) Assessed for additional providers  Immunization History  Administered Date(s) Administered  . Influenza Split 07/30/2011, 07/10/2012  . Influenza Whole 08/04/2007, 07/21/2008, 07/18/2009, 07/16/2010  . Influenza, High Dose Seasonal PF 07/16/2016  . Influenza,inj,Quad PF,6+ Mos 07/05/2013, 08/03/2014, 07/13/2015  . Pneumococcal Conjugate-13 01/09/2016  . Pneumococcal Polysaccharide-23 04/01/2017  . Td 10/22/1999, 12/11/2009   Required Immunizations needed today  Screening test up to date or reviewed for plan of completion Health Maintenance Due  Topic Date Due  . INFLUENZA VACCINE  05/21/2017   Educated regarding singrix         Objective:    There were no vitals filed for this visit. There is no height or weight on file to calculate BMI.  Current Medications (verified) Outpatient Encounter Prescriptions as of 07/02/2017  Medication Sig  . aspirin 81 MG tablet Take 81 mg by mouth daily.    . Folic Acid 867 MCG TABS Take 2 tablets by mouth daily.    [name] gabapentin (NEURONTIN) 100 MG capsule  take 1 capsule by mouth at bedtime for 7 days then 1 capsule  . irbesartan (AVAPRO) 300 MG tablet Take 1 tablet (300 mg total) by mouth daily.  . MULTIPLE VITAMIN PO Take by mouth daily.    . Omega-3 Fatty Acids (FISH OIL) 1000 MG CAPS Take 2 capsules by mouth 2 (two) times daily.    [name] omeprazole (PRILOSEC) 40 MG capsule Take 1 capsule (40 mg total) by mouth daily.  . pravastatin (PRAVACHOL) 40 MG tablet take 1 tablet by mouth once daily  . tadalafil (CIALIS) 20 MG tablet Take 0.5-1 tablets (10-20 mg total) by mouth every other day as needed for erectile dysfunction.  . tizanidine (ZANAFLEX) 2 MG capsule Take 2 mg by mouth as needed for muscle spasms.   No facility-administered encounter medications on file as of 07/02/2017.     Allergies (verified) Patient has no known allergies.   History: Past Medical History:  Diagnosis Date  . Adenomatous colon polyp 06/2006  . ALLERGIC RHINITIS   . Allergy   . Anemia   . ANEMIA DUE TO DIETARY IRON DEFICIENCY 05/28/2007   Due to giving regularly giving blood. Resolved with cutting in half.     . COMPRESSION FRACTURE, THORACIC VERTEBRA 03/09/2008   2009, no chronic pain   . Diverticulitis of colon   . DIVERTICULOSIS, COLON 05/28/2007   Qualifier: Diagnosis of  By: Arnoldo Morale MD, Balinda Quails   . GERD (gastroesophageal reflux disease)   . Hyperlipidemia   . Hypertension   . Ulcer 1992  gastric   Past Surgical History:  Procedure Laterality Date  . COLONOSCOPY    . COLONOSCOPY W/ POLYPECTOMY    . FOOT SURGERY     pain scraper several inches into foot  . POLYPECTOMY     Family History  Problem Relation Age of Onset  . Cancer Father        lung, former smoker, [place] lung in [place]  . Dementia Mother   . Hemochromatosis Brother   . Colon cancer Neg Hx   . Esophageal cancer Neg Hx   . Rectal cancer Neg Hx   . Stomach cancer Neg Hx    Social History   Occupational History  . Not on file.   Social History Main Topics  . Smoking status: Former  Smoker    Packs/day: 1.00    Years: 15.00    Types: Cigarettes    Quit date: 10/21/1978  . Smokeless tobacco: Never Used  . Alcohol use No     Comment: Quit drinking 2006  . Drug use: No  . Sexual activity: Yes   Tobacco Counseling Counseling given: Not Answered   Activities of Daily Living No flowsheet data found.  Immunizations and Health Maintenance Immunization History  Administered Date(s) Administered  . Influenza Split 07/30/2011, 07/10/2012  . Influenza Whole 08/04/2007, 07/21/2008, 07/18/2009, 07/16/2010  . Influenza, High Dose Seasonal PF 07/16/2016  . Influenza,inj,Quad PF,6+ Mos 07/05/2013, 08/03/2014, 07/13/2015  . Pneumococcal Conjugate-13 01/09/2016  . Pneumococcal Polysaccharide-23 04/01/2017  . Td 10/22/1999, 12/11/2009   Health Maintenance Due  Topic Date Due  . INFLUENZA VACCINE  05/21/2017    Patient Care Team: Marin Olp, MD as PCP - General (Family Medicine)  Indicate any recent Medical Services you may have received from other than [facility] providers in the past year (date may be approximate).    Assessment:   This is a routine wellness examination for Larry Williamson. ***  Hearing/Vision screen No exam data present  Dietary issues and exercise activities discussed:    Goals    None     Depression Screen PHQ 2/9 Scores 04/01/2017 01/09/2016  PHQ - 2 Score 0 0    Fall Risk Fall Risk  04/01/2017 01/09/2016  Falls in the past year? No No    Cognitive Function:        Screening Tests Health Maintenance  Topic Date Due  . INFLUENZA VACCINE  05/21/2017  . Hepatitis C Screening  10/17/2098 (Originally [birth year])  . TETANUS/TDAP  12/12/2019  . COLONOSCOPY  02/04/2022  . PNA vac Low Risk Adult  Completed        Plan:   ***  I have personally reviewed and noted the following in the patient's chart:   . Medical and social history . Use of alcohol, tobacco or illicit drugs  . Current medications and supplements . Functional ability  and status . Nutritional status . Physical activity . Advanced directives . List of other physicians . Hospitalizations, surgeries, and ER visits in previous 12 months . Vitals . Screenings to include cognitive, depression, and falls . Referrals and appointments  In addition, I have reviewed and discussed with patient certain preventive protocols, quality metrics, and best practice recommendations. A written personalized care plan for preventive services as well as general preventive health recommendations were provided to patient.     Wynetta Fines, RN   07/01/2017

## 2017-07-02 ENCOUNTER — Ambulatory Visit: Payer: Medicare Other

## 2017-07-16 ENCOUNTER — Ambulatory Visit: Payer: Medicare Other

## 2017-07-17 ENCOUNTER — Ambulatory Visit (INDEPENDENT_AMBULATORY_CARE_PROVIDER_SITE_OTHER): Payer: Medicare Other | Admitting: *Deleted

## 2017-07-17 DIAGNOSIS — Z23 Encounter for immunization: Secondary | ICD-10-CM | POA: Diagnosis not present

## 2017-08-18 ENCOUNTER — Encounter: Payer: Self-pay | Admitting: Gastroenterology

## 2017-08-19 ENCOUNTER — Encounter: Payer: Self-pay | Admitting: Gastroenterology

## 2017-08-24 ENCOUNTER — Other Ambulatory Visit: Payer: Self-pay | Admitting: Family Medicine

## 2017-09-15 ENCOUNTER — Other Ambulatory Visit: Payer: Self-pay | Admitting: Family Medicine

## 2017-09-19 ENCOUNTER — Other Ambulatory Visit: Payer: Self-pay | Admitting: Family Medicine

## 2017-09-25 ENCOUNTER — Encounter: Payer: Self-pay | Admitting: Family Medicine

## 2017-09-26 ENCOUNTER — Other Ambulatory Visit: Payer: Medicare Other

## 2017-10-03 ENCOUNTER — Encounter: Payer: Medicare Other | Admitting: Family Medicine

## 2017-10-06 ENCOUNTER — Other Ambulatory Visit: Payer: Self-pay

## 2017-10-06 ENCOUNTER — Ambulatory Visit (AMBULATORY_SURGERY_CENTER): Payer: Self-pay | Admitting: *Deleted

## 2017-10-06 VITALS — Ht 69.5 in | Wt 238.0 lb

## 2017-10-06 DIAGNOSIS — Z86010 Personal history of colonic polyps: Secondary | ICD-10-CM

## 2017-10-06 MED ORDER — SUPREP BOWEL PREP KIT 17.5-3.13-1.6 GM/177ML PO SOLN: 1.0000 | Freq: Once | ORAL | 0 refills | Status: AC

## 2017-10-06 NOTE — Progress Notes (Signed)
Patient denies any allergies to egg or soy products. Patient denies complications with anesthesia/sedation.  Patient denies oxygen use at home and denies diet medications. Patient denies information on colonoscopy. 

## 2017-10-20 ENCOUNTER — Encounter: Payer: Self-pay | Admitting: Family Medicine

## 2017-10-20 ENCOUNTER — Ambulatory Visit (INDEPENDENT_AMBULATORY_CARE_PROVIDER_SITE_OTHER): Payer: Medicare Other | Admitting: Family Medicine

## 2017-10-20 VITALS — BP 132/66 | HR 71 | Temp 98.3°F | Ht 69.5 in | Wt 239.6 lb

## 2017-10-20 DIAGNOSIS — I10 Essential (primary) hypertension: Secondary | ICD-10-CM | POA: Diagnosis not present

## 2017-10-20 DIAGNOSIS — I714 Abdominal aortic aneurysm, without rupture, unspecified: Secondary | ICD-10-CM

## 2017-10-20 DIAGNOSIS — E785 Hyperlipidemia, unspecified: Secondary | ICD-10-CM | POA: Diagnosis not present

## 2017-10-20 DIAGNOSIS — Z0000 Encounter for general adult medical examination without abnormal findings: Secondary | ICD-10-CM | POA: Diagnosis not present

## 2017-10-20 DIAGNOSIS — Z125 Encounter for screening for malignant neoplasm of prostate: Secondary | ICD-10-CM | POA: Diagnosis not present

## 2017-10-20 DIAGNOSIS — R739 Hyperglycemia, unspecified: Secondary | ICD-10-CM | POA: Diagnosis not present

## 2017-10-20 MED ORDER — IRBESARTAN 300 MG PO TABS: 300.0000 mg | ORAL_TABLET | Freq: Every day | ORAL | 3 refills | Status: DC

## 2017-10-20 MED ORDER — TADALAFIL 20 MG PO TABS: 10.0000 mg | ORAL_TABLET | ORAL | 11 refills | Status: DC | PRN

## 2017-10-20 NOTE — Progress Notes (Signed)
Phone: [phone]  Subjective:  Patient presents today for their annual physical. Chief complaint-noted.   See problem oriented charting- ROS- full  review of systems was completed and negative except for: eye pain, seasonal allergies, neck pain  The following were reviewed and entered/updated in epic: Past Medical History:  Diagnosis Date  . Adenomatous colon polyp 06/2006  . ALLERGIC RHINITIS   . Allergy   . Anemia   . ANEMIA DUE TO DIETARY IRON DEFICIENCY 05/28/2007   Due to giving regularly giving blood. Resolved with cutting in half.     . COMPRESSION FRACTURE, THORACIC VERTEBRA 03/09/2008   2009, no chronic pain   . Diverticulitis of colon   . DIVERTICULOSIS, COLON 05/28/2007   Qualifier: Diagnosis of  By: Arnoldo Morale MD, Balinda Quails   . GERD (gastroesophageal reflux disease)   . Hyperlipidemia   . Hypertension   . Ulcer 1992   gastric   Patient Active Problem List   Diagnosis Date Noted  . AAA (abdominal aortic aneurysm) ([place]) 01/18/2016    Priority: High  . Hyperglycemia 10/01/2016    Priority: Medium  . Meralgia paresthetica 12/28/2014    Priority: Medium  . Hyperlipidemia 05/15/2007    Priority: Medium  . Essential hypertension 05/15/2007    Priority: Medium  . History of skin cancer 12/28/2014    Priority: Low  . Former smoker 12/28/2014    Priority: Low  . Abnormal liver enzymes 12/29/2010    Priority: Low  . Allergic rhinitis 06/17/2008    Priority: Low  . ERECTILE DYSFUNCTION 12/07/2007    Priority: Low  . History of colonic polyps 12/07/2007    Priority: Low  . Umbilical hernia 24/46/2863    Priority: Low  . GERD 05/15/2007    Priority: Low   Past Surgical History:  Procedure Laterality Date  . COLONOSCOPY  01/2017   hx polyps/tics/hems  . COLONOSCOPY W/ POLYPECTOMY    . FOOT SURGERY Right    pain scraper several inches into foot  . POLYPECTOMY      Family History  Problem Relation Age of Onset  . Cancer Father        lung, former smoker,  [place] lung in [place]  . Dementia Mother   . Hemochromatosis Brother   . Colon cancer Neg Hx   . Esophageal cancer Neg Hx   . Rectal cancer Neg Hx   . Stomach cancer Neg Hx   . Colon polyps Neg Hx     Medications- reviewed and updated Current Outpatient Medications  Medication Sig Dispense Refill  . aspirin 81 MG tablet Take 81 mg by mouth daily.      . Folic Acid 817 MCG TABS Take 2 tablets by mouth daily.      [name] gabapentin (NEURONTIN) 100 MG capsule TAKE ONE CAPSULE BY MOUTH AT BEDTIME 30 capsule 2  . MULTIPLE VITAMIN PO Take by mouth daily.      . Omega-Larry Fatty Acids (FISH OIL) 1000 MG CAPS Take 2 capsules by mouth 2 (two) times daily.      [name] omeprazole (PRILOSEC) 40 MG capsule TAKE ONE CAPSULE BY MOUTH EVERY DAY 90 capsule 0  . pravastatin (PRAVACHOL) 40 MG tablet TAKE 1 TABLET BY MOUTH EVERY DAY 90 tablet Larry  . tadalafil (CIALIS) 20 MG tablet Take 0.5-1 tablets (10-20 mg total) by mouth every other day as needed for erectile dysfunction. 5 tablet 11  . tizanidine (ZANAFLEX) 2 MG capsule Take 2 mg by mouth as needed for muscle spasms.    [name]  irbesartan (AVAPRO) 300 MG tablet Take 1 tablet (300 mg total) by mouth daily. 90 tablet Larry   No current facility-administered medications for this visit.     Allergies-reviewed and updated No Known Allergies  Social History   Socioeconomic History  . Marital status: Married    Spouse name: None  . Number of children: None  . Years of education: None  . Highest education level: None  Social Needs  . Financial resource strain: None  . Food insecurity - worry: None  . Food insecurity - inability: None  . Transportation needs - medical: None  . Transportation needs - non-medical: None  Occupational History  . None  Tobacco Use  . Smoking status: Former Smoker    Packs/day: 1.00    Years: 15.00    Pack years: 15.00    Types: Cigarettes    Last attempt to quit: 10/21/1978    Years since quitting: 39.0  . Smokeless tobacco: Never Used    Substance and Sexual Activity  . Alcohol use: Yes    Alcohol/week: 0.0 oz    Comment: 2 drinks /week   . Drug use: No  . Sexual activity: Yes  Other Topics Concern  . None  Social History Narrative   Married (wife pt of Dr. Maudie Mercury). 5 children (2 by first wife, Larry stepkids), 11 granddaughters + 2 greatgrandsons.       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 Oropharynx normal Neck: no thyromegaly CV: RRR no murmurs rubs or gallops Lungs: CTAB no crackles, wheeze, rhonchi Abdomen: soft/nontender/nondistended/normal bowel sounds. No rebound or guarding. Obese. Umbilical hernia noted- not painful Ext: no edema Skin: warm, dry Neuro: grossly normal, moves all extremities, PERRLA Rectal: normal tone, normal sized prostate, no masses or tenderness  Assessment/Plan:  66 y.o. Williamson presenting for annual physical.  Health Maintenance counseling: 1. Anticipatory guidance: Patient counseled regarding regular dental exams -q6 months, eye exams -yearly or every other, wearing seatbelts.  2. Risk factor reduction:  Advised patient of need for regular exercise and diet rich and fruits and vegetables to reduce risk of heart attack and stroke. Exercise- plans to restart his walking he was doing last winter. Diet-up 6 lbs since June. Knows he needs to cut down on portion size as well as bread and potatoes.  Wt Readings from Last Larry Encounters:  10/20/17 239 lb 9.6 oz (108.7 kg)  10/06/17 238 lb (108 kg)  04/01/17 233 lb 12.8 oz (106.1 kg)  Larry. Immunizations/screenings/ancillary studies- advised shingrix at the  pharmacy or the [place].  Immunization History  Administered Date(s) Administered  . Influenza Split 07/30/2011, 07/10/2012  . Influenza Whole 08/04/2007, 07/21/2008, 07/18/2009, 07/16/2010  . Influenza, High Dose Seasonal PF 07/16/2016, 07/17/2017  . Influenza,inj,Quad PF,6+ Mos 07/05/2013, 08/03/2014, 07/13/2015  . Pneumococcal Conjugate-13 01/09/2016  . Pneumococcal Polysaccharide-23 04/01/2017  . Td 10/22/1999, 12/11/2009   4. Prostate cancer screening- low risk PSA trend prior. Low risk rectal exam today   Lab Results  Component Value Date   PSA  1.15 09/26/2016   PSA 1.08 07/06/2015   PSA 1.00 07/04/2014   5. Colon cancer screening - normal colonoscopy 2013. Repeat 2018 and has another one planned next week due to adenoma type.  6. Skin cancer screening- Dr. Danella Penton yearly. advised regular sunscreen use. Denies worrisome, changing, or new skin lesions.   Status of chronic or acute concerns   Several months intermittent left neck pain- uses a topical and as needed medications. Could be OA neck.   Stye busted open in left eye last night-  pain improved this morning.   Mild hyperglycemia last year- will update CBG, a1c  HTN- at goal on irbesartan 300mg . We stopped his valsartan 320mg . He is going to verify which he is taking at home  HLD- has been at goal with LDL under 100 on pravastatin 40mg   GERD- last year discussed trial PPI at 20mg  omeprazole instead of 40mg - he failed that trial  Meralgia paresthetica- 100mg  Gabapentin each morning. Bad on concrete  ED= refill cialis  AAA (abdominal aortic aneurysm) ([place]) AAA better 04/2017- now with 2 year repeat advised  Future Appointments  Date Time Provider [place]  10/22/2017  8:30 AM [facility]  10/27/2017  8:00 AM Ladene Artist, MD [facility]   1 year CPE at latest. Reasonable to check in 6 months from now especially with weight trend.   Preventative health care - Plan: Hemoglobin A1c, CBC,  Comprehensive metabolic panel, Lipid panel, PSA, Urinalysis  Hyperglycemia - Plan: Hemoglobin A1c  Hyperlipidemia, unspecified hyperlipidemia type - Plan: CBC, Comprehensive metabolic panel, Lipid panel, Urinalysis  Essential hypertension - Plan: CBC, Comprehensive metabolic panel, Lipid panel, Urinalysis  Screening for prostate cancer - Plan: PSA  Meds ordered this encounter  Medications  . irbesartan (AVAPRO) 300 MG tablet    Sig: Take 1 tablet (300 mg total) by mouth daily.    Dispense:  90 tablet    Refill:  Larry  . tadalafil (CIALIS) 20 MG tablet    Sig: Take 0.5-1 tablets (10-20 mg total) by mouth every other day as needed for erectile dysfunction.    Dispense:  5 tablet    Refill:  11    Return precautions advised.  Garret Reddish, MD

## 2017-10-20 NOTE — Patient Instructions (Addendum)
Try to get shingrix at your pharmacy or the [place].   Make sure you are taking irbesartan 300mg  instead of valsartan 320mg . I sent this into your pharmacy again today.   Schedule a lab visit at the check out desk within 2 weeks. Return for future fasting labs meaning nothing but water after midnight please. Ok to take your medications with water.

## 2017-10-20 NOTE — Assessment & Plan Note (Signed)
AAA better 04/2017- now with 2 year repeat advised

## 2017-10-21 HISTORY — PX: COLONOSCOPY: SHX174

## 2017-10-22 ENCOUNTER — Other Ambulatory Visit: Payer: Self-pay | Admitting: Family Medicine

## 2017-10-22 ENCOUNTER — Other Ambulatory Visit (INDEPENDENT_AMBULATORY_CARE_PROVIDER_SITE_OTHER): Payer: Medicare Other

## 2017-10-22 DIAGNOSIS — E785 Hyperlipidemia, unspecified: Secondary | ICD-10-CM

## 2017-10-22 DIAGNOSIS — Z0000 Encounter for general adult medical examination without abnormal findings: Secondary | ICD-10-CM | POA: Diagnosis not present

## 2017-10-22 DIAGNOSIS — I10 Essential (primary) hypertension: Secondary | ICD-10-CM | POA: Diagnosis not present

## 2017-10-22 DIAGNOSIS — R739 Hyperglycemia, unspecified: Secondary | ICD-10-CM | POA: Diagnosis not present

## 2017-10-22 DIAGNOSIS — Z125 Encounter for screening for malignant neoplasm of prostate: Secondary | ICD-10-CM | POA: Diagnosis not present

## 2017-10-22 LAB — URINALYSIS
BILIRUBIN URINE: NEGATIVE
Hgb urine dipstick: NEGATIVE
KETONES UR: NEGATIVE
LEUKOCYTES UA: NEGATIVE
Nitrite: NEGATIVE
PH: 6 (ref 5.0–8.0)
Specific Gravity, Urine: 1.02 (ref 1.000–1.030)
Total Protein, Urine: NEGATIVE
Urine Glucose: NEGATIVE
Urobilinogen, UA: 0.2 (ref 0.0–1.0)

## 2017-10-22 LAB — COMPREHENSIVE METABOLIC PANEL
ALK PHOS: 36 U/L — AB (ref 39–117)
ALT: 68 U/L — ABNORMAL HIGH (ref 0–53)
AST: 37 U/L (ref 0–37)
Albumin: 4.5 g/dL (ref 3.5–5.2)
BUN: 19 mg/dL (ref 6–23)
CO2: 21 meq/L (ref 19–32)
Calcium: 9.5 mg/dL (ref 8.4–10.5)
Chloride: 102 mEq/L (ref 96–112)
Creatinine, Ser: 1.16 mg/dL (ref 0.40–1.50)
GFR: 66.71 mL/min (ref >60.00)
GLUCOSE: 101 mg/dL — AB (ref 70–99)
POTASSIUM: 4.2 meq/L (ref 3.5–5.1)
Sodium: 137 mEq/L (ref 135–145)
TOTAL PROTEIN: 7.4 g/dL (ref 6.0–8.3)
Total Bilirubin: 1.2 mg/dL (ref 0.2–1.2)

## 2017-10-22 LAB — PSA: PSA: 1.74 ng/mL (ref 0.10–4.00)

## 2017-10-22 LAB — LIPID PANEL
CHOL/HDL RATIO: 5
Cholesterol: 166 mg/dL (ref 0–200)
HDL: 33.9 mg/dL — AB (ref >39.00)
NONHDL: 131.67
TRIGLYCERIDES: 251 mg/dL — AB (ref 0.0–149.0)
VLDL: 50.2 mg/dL — ABNORMAL HIGH (ref 0.0–40.0)

## 2017-10-22 LAB — CBC
HCT: 45.2 % (ref 39.0–52.0)
HEMOGLOBIN: 15.7 g/dL (ref 13.0–17.0)
MCHC: 34.7 g/dL (ref 30.0–36.0)
MCV: 86.7 fl (ref 78.0–100.0)
PLATELETS: 198 10*3/uL (ref 150.0–400.0)
RBC: 5.21 Mil/uL (ref 4.22–5.81)
RDW: 13.7 % (ref 11.5–15.5)
WBC: 7.2 10*3/uL (ref 4.0–10.5)

## 2017-10-22 LAB — LDL CHOLESTEROL, DIRECT: Direct LDL: 113 mg/dL

## 2017-10-22 LAB — HEMOGLOBIN A1C: Hgb A1c MFr Bld: 5.4 % (ref 4.6–6.5)

## 2017-10-23 ENCOUNTER — Other Ambulatory Visit: Payer: Self-pay

## 2017-10-23 DIAGNOSIS — R972 Elevated prostate specific antigen [PSA]: Secondary | ICD-10-CM

## 2017-10-27 ENCOUNTER — Encounter: Payer: Self-pay | Admitting: Gastroenterology

## 2017-10-27 ENCOUNTER — Other Ambulatory Visit: Payer: Self-pay

## 2017-10-27 ENCOUNTER — Ambulatory Visit (AMBULATORY_SURGERY_CENTER): Payer: Medicare Other | Admitting: Gastroenterology

## 2017-10-27 VITALS — BP 167/92 | HR 70 | Temp 96.6°F | Resp 15 | Ht 69.5 in | Wt 238.0 lb

## 2017-10-27 DIAGNOSIS — Z86010 Personal history of colonic polyps: Secondary | ICD-10-CM | POA: Diagnosis present

## 2017-10-27 MED ORDER — SODIUM CHLORIDE 0.9 % IV SOLN: 500.0000 mL | INTRAVENOUS | Status: DC

## 2017-10-27 NOTE — Progress Notes (Signed)
No changes in medical or surgical hx since PV per pt 

## 2017-10-27 NOTE — Patient Instructions (Signed)
YOU HAD AN ENDOSCOPIC PROCEDURE TODAY AT [facility]:   Refer to the procedure report that was given to you for any specific questions about what was found during the examination.  If the procedure report does not answer your questions, please call your gastroenterologist to clarify.  If you requested that your care partner not be given the details of your procedure findings, then the procedure report has been included in a sealed envelope for you to review at your convenience later.  YOU SHOULD EXPECT: Some feelings of bloating in the abdomen. Passage of more gas than usual.  Walking can help get rid of the air that was put into your GI tract during the procedure and reduce the bloating. If you had a lower endoscopy (such as a colonoscopy or flexible sigmoidoscopy) you may notice spotting of blood in your stool or on the toilet paper. If you underwent a bowel prep for your procedure, you may not have a normal bowel movement for a few days.  Please Note:  You might notice some irritation and congestion in your nose or some drainage.  This is from the oxygen used during your procedure.  There is no need for concern and it should clear up in a day or so.  SYMPTOMS TO REPORT IMMEDIATELY:   Following lower endoscopy (colonoscopy or flexible sigmoidoscopy):  Excessive amounts of blood in the stool  Significant tenderness or worsening of abdominal pains  Swelling of the abdomen that is new, acute  Fever of 100F or higher   For urgent or emergent issues, a gastroenterologist can be reached at any hour by calling [phone].   DIET:  We do recommend a small meal at first, but then you may proceed to your regular diet.  Drink plenty of fluids but you should avoid alcoholic beverages for 24 hours. Try to increase the fiber in your diet, and drink plenty of water.  ACTIVITY:  You should plan to take it easy for the rest of today and you should NOT DRIVE or use heavy machinery until  tomorrow (because of the sedation medicines used during the test).    FOLLOW UP: Our staff will call the number listed on your records the next business day following your procedure to check on you and address any questions or concerns that you may have regarding the information given to you following your procedure. If we do not reach you, we will leave a message.  However, if you are feeling well and you are not experiencing any problems, there is no need to return our call.  We will assume that you have returned to your regular daily activities without incident.  If any biopsies were taken you will be contacted by phone or by letter within the next 1-3 weeks.  Please call us at [phone] if you have not heard about the biopsies in 3 weeks.    SIGNATURES/CONFIDENTIALITY: You and/or your care partner have signed paperwork which will be entered into your electronic medical record.  These signatures attest to the fact that that the information above on your After Visit Summary has been reviewed and is understood.  Full responsibility of the confidentiality of this discharge information lies with you and/or your care-partner.  You will need another colonoscopy in 3 years per Dr. Fuller Plan.

## 2017-10-27 NOTE — Op Note (Signed)
[facility] Patient Name: Larry Williamson Procedure Date: 10/27/2017 7:19 AM MRN: [id number] Endoscopist: Ladene Artist , MD Age: 67 Referring MD:  Date of Birth: Dec 21, 1950 Gender: Male Account #: [id number] Procedure:                Colonoscopy Indications:              Surveillance: Personal history of piecemeal removal                            of adenoma on last colonoscopy (less than 1 year                            ago) Medicines:                Monitored Anesthesia Care Procedure:                Pre-Anesthesia Assessment:                           - Prior to the procedure, a History and Physical                            was performed, and patient medications and                            allergies were reviewed. The patient's tolerance of                            previous anesthesia was also reviewed. The risks                            and benefits of the procedure and the sedation                            options and risks were discussed with the patient.                            All questions were answered, and informed consent                            was obtained. Prior Anticoagulants: The patient has                            taken no previous anticoagulant or antiplatelet                            agents. ASA Grade Assessment: II - A patient with                            mild systemic disease. After reviewing the risks                            and benefits, the patient was deemed in  satisfactory condition to undergo the procedure.                           After obtaining informed consent, the colonoscope                            was passed under direct vision. Throughout the                            procedure, the patient's blood pressure, pulse, and                            oxygen saturations were monitored continuously. The                            Colonoscope was introduced through the anus and                       advanced to the the cecum, identified by                            appendiceal orifice and ileocecal valve. The                            ileocecal valve, appendiceal orifice, and rectum                            were photographed. The quality of the bowel                            preparation was good. The colonoscopy was performed                            without difficulty. The patient tolerated the                            procedure well. Scope In: 8:05:24 AM Scope Out: 8:19:05 AM Scope Withdrawal Time: 0 hours 11 minutes 36 seconds  Total Procedure Duration: 0 hours 13 minutes 41 seconds  Findings:                 The perianal and digital rectal examinations were                            normal.                           Multiple medium-mouthed diverticula were found in                            the left colon. There was no evidence of                            diverticular bleeding.  Internal hemorrhoids were found during                            retroflexion. The hemorrhoids were small and Grade                            I (internal hemorrhoids that do not prolapse).                           The exam was otherwise without abnormality on                            direct and retroflexion views. No residual cecal                            polyp. Complications:            No immediate complications. Estimated blood loss:                            None. Estimated Blood Loss:     Estimated blood loss: none. Impression:               - Moderate diverticulosis in the left colon. There                            was no evidence of diverticular bleeding.                           - Internal hemorrhoids.                           - The examination was otherwise normal on direct                            and retroflexion views.                           - No specimens collected. Recommendation:           - Repeat colonoscopy  in 3 years for surveillance.                           - Patient has a contact number available for                            emergencies. The signs and symptoms of potential                            delayed complications were discussed with the                            patient. Return to normal activities tomorrow.                            Written discharge instructions were provided to the  patient.                           - High fiber diet.                           - Continue present medications. Ladene Artist, MD 10/27/2017 8:24:51 AM This report has been signed electronically.

## 2017-10-27 NOTE — Progress Notes (Signed)
Report to PACU, RN, vss, BBS= Clear.  

## 2017-10-28 ENCOUNTER — Telehealth: Payer: Self-pay

## 2017-10-28 NOTE — Telephone Encounter (Signed)
  Follow up Call-  Call back number 10/27/2017 02/04/2017  Post procedure Call Back phone  # [phone]  Permission to leave phone message Yes Yes  Some recent data might be hidden     Patient questions:  Do you have a fever, pain , or abdominal swelling? No. Pain Score  0 *  Have you tolerated food without any problems? Yes.    Have you been able to return to your normal activities? Yes.    Do you have any questions about your discharge instructions: Diet   No. Medications  No. Follow up visit  No.  Do you have questions or concerns about your Care? No.  Actions: * If pain score is 4 or above: No action needed, pain <4.

## 2017-11-12 ENCOUNTER — Encounter: Payer: Self-pay | Admitting: Family Medicine

## 2017-11-21 ENCOUNTER — Other Ambulatory Visit: Payer: Self-pay | Admitting: Family Medicine

## 2018-01-21 ENCOUNTER — Other Ambulatory Visit (INDEPENDENT_AMBULATORY_CARE_PROVIDER_SITE_OTHER): Payer: Medicare Other

## 2018-01-21 DIAGNOSIS — R972 Elevated prostate specific antigen [PSA]: Secondary | ICD-10-CM

## 2018-01-21 LAB — PSA: PSA: 0.99 ng/mL (ref 0.10–4.00)

## 2018-01-22 ENCOUNTER — Telehealth: Payer: Self-pay

## 2018-01-22 NOTE — Telephone Encounter (Signed)
Called and spoke to patient and informed him that his psa has dropped right back down and is low risk for prostate cancer. Patient Verbalized understanding.

## 2018-04-02 ENCOUNTER — Other Ambulatory Visit: Payer: Self-pay | Admitting: Family Medicine

## 2018-04-02 ENCOUNTER — Other Ambulatory Visit: Payer: Self-pay

## 2018-04-02 MED ORDER — IRBESARTAN 300 MG PO TABS: 300.0000 mg | ORAL_TABLET | Freq: Every day | ORAL | 3 refills | Status: DC

## 2018-06-23 ENCOUNTER — Other Ambulatory Visit: Payer: Self-pay | Admitting: Family Medicine

## 2018-07-08 NOTE — Progress Notes (Signed)
Subjective:   Wirt Hemmerich is a 67 y.o. male who presents for an Initial Medicare Annual Wellness Visit.  Reports health as good Still works  2 children and blended family   Diet Chol/hdl 5; hdl 33; trig 251 A1c 5.4 Preventive health exam 10/20/2017 Breakfast; scrambled egg, one piece sausage, grits Lunch crackers Supper normal cooked meal    Exercise Still working and stay son the move Used to walk a lot but now legs ache Legs cramp at times   Tries to drink a lot of water  Vision q 2 to 3 years Dr. Sabra Heck     Health Maintenance Due  Topic Date Due  . INFLUENZA VACCINE  05/21/2018   Tobacco use; 15 pack year but quit 1980  AAA completed in 2017 and 1018 and now with 2 year repeat  PSA 01/2018  Colonoscopy 10/2017  Educated regarding shingrix   Cardiac Risk Factors include: advanced age (>50men, >21 women);family history of premature cardiovascular disease;hypertension;male gender;obesity (BMI >30kg/m2)    Objective:    Today's Vitals   07/09/18 0809 07/09/18 0853  BP: (!) 160/80 136/70  Pulse: 71   SpO2: 98%   Weight: 227 lb (103 kg)   Height: 5' 9.5" (1.765 m)    Body mass index is 33.04 kg/m.  Advanced Directives 07/09/2018 10/27/2017 02/04/2017 01/21/2017  Does Patient Have a Medical Advance Directive? Yes Yes Yes Yes  Type of Advance Directive - Healthcare Power of [place];Living will [place];Living will    Current Medications (verified) Outpatient Encounter Medications as of 07/09/2018  Medication Sig  . aspirin 81 MG tablet Take 81 mg by mouth daily.    . Folic Acid 939 MCG TABS Take 2 tablets by mouth daily.    [name] gabapentin (NEURONTIN) 100 MG capsule TAKE ONE CAPSULE BY MOUTH AT BEDTIME  . irbesartan (AVAPRO) 300 MG tablet TAKE 1 TABLET BY MOUTH EVERY DAY  . irbesartan (AVAPRO) 300 MG tablet Take 1 tablet (300 mg total) by mouth daily.  . MULTIPLE VITAMIN PO Take by mouth daily.    .  Omega-3 Fatty Acids (FISH OIL) 1000 MG CAPS Take 2 capsules by mouth 2 (two) times daily.    [name] omeprazole (PRILOSEC) 40 MG capsule TAKE 1 CAPSULE BY MOUTH EVERY DAY  . pravastatin (PRAVACHOL) 40 MG tablet TAKE 1 TABLET BY MOUTH EVERY DAY  . tadalafil (CIALIS) 20 MG tablet Take 0.5-1 tablets (10-20 mg total) by mouth every other day as needed for erectile dysfunction.  . tizanidine (ZANAFLEX) 2 MG capsule Take 2 mg by mouth as needed for muscle spasms.   No facility-administered encounter medications on file as of 07/09/2018.     Allergies (verified) Patient has no known allergies.   History: Past Medical History:  Diagnosis Date  . Adenomatous colon polyp 06/2006  . ALLERGIC RHINITIS   . Allergy   . Anemia   . ANEMIA DUE TO DIETARY IRON DEFICIENCY 05/28/2007   Due to giving regularly giving blood. Resolved with cutting in half.     . COMPRESSION FRACTURE, THORACIC VERTEBRA 03/09/2008   2009, no chronic pain   . Diverticulitis of colon   . DIVERTICULOSIS, COLON 05/28/2007   Qualifier: Diagnosis of  By: Arnoldo Morale MD, Balinda Quails   . GERD (gastroesophageal reflux disease)   . Hyperlipidemia   . Hypertension   . Ulcer 1992   gastric   Past Surgical History:  Procedure Laterality Date  . COLONOSCOPY  01/2017  hx polyps/tics/hems  . COLONOSCOPY W/ POLYPECTOMY    . FOOT SURGERY Right    pain scraper several inches into foot  . POLYPECTOMY     Family History  Problem Relation Age of Onset  . Cancer Father        lung, former smoker, [place] lung [place]  . Dementia Mother   . Hemochromatosis Brother   . Colon cancer Neg Hx   . Esophageal cancer Neg Hx   . Rectal cancer Neg Hx   . Stomach cancer Neg Hx   . Colon polyps Neg Hx    Social History   Socioeconomic History  . Marital status: Married    Spouse name: Not on file  . Number of children: Not on file  . Years of education: Not on file  . Highest education level: Not on file  Occupational History  . Not on file  Social  Needs  . Financial resource strain: Not on file  . Food insecurity:    Worry: Not on file    Inability: Not on file  . Transportation needs:    Medical: Not on file    Non-medical: Not on file  Tobacco Use  . Smoking status: Former Smoker    Packs/day: 1.00    Years: 15.00    Pack years: 15.00    Types: Cigarettes    Last attempt to quit: 10/21/1978    Years since quitting: 39.7  . Smokeless tobacco: Never Used  Substance and Sexual Activity  . Alcohol use: Yes    Alcohol/week: 0.0 standard drinks    Comment: 2 drinks /week   . Drug use: No  . Sexual activity: Yes  Lifestyle  . Physical activity:    Days per week: Not on file    Minutes per session: Not on file  . Stress: Not on file  Relationships  . Social connections:    Talks on phone: Not on file    Gets together: Not on file    Attends religious service: Not on file    Active member of club or organization: Not on file    Attends meetings of clubs or organizations: Not on file    Relationship status: Not on file  Other Topics Concern  . Not on file  Social History Narrative   Married (wife pt of Dr. Maudie Mercury). 5 children (2 by first wife, 3 stepkids), 11 granddaughters + 2 greatgrandsons.       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 N  Vision? N  Difficulty concentrating or making decisions? N  Walking or climbing stairs? N  Dressing or bathing? N  Doing errands, shopping? N  Preparing Food and eating ? N  Using the Toilet? N  In the past six months, have you accidently leaked urine? N  Do you have problems with loss of bowel control? N  Managing your Medications? N  Managing your Finances? N    Housekeeping or managing your Housekeeping? N  Some recent data might be hidden     Immunizations and Health Maintenance Immunization History  Administered Date(s) Administered  . Influenza Split 07/30/2011, 07/10/2012  . Influenza Whole 08/04/2007, 07/21/2008,  07/18/2009, 07/16/2010  . Influenza, High Dose Seasonal PF 07/16/2016, 07/17/2017  . Influenza,inj,Quad PF,6+ Mos 07/05/2013, 08/03/2014, 07/13/2015  . Pneumococcal Conjugate-13 01/09/2016  . Pneumococcal Polysaccharide-23 04/01/2017  . Td 10/22/1999, 12/11/2009   Health Maintenance Due  Topic Date Due  . INFLUENZA VACCINE  05/21/2018    Patient Care Team: Marin Olp, MD as PCP - General (Family Medicine)  Indicate any recent Medical Services you may have received from other than [facility] providers in the past year (date may be approximate).    Assessment:   This is a routine wellness examination for [place].  Hearing/Vision screen  Hearing Screening   125Hz  250Hz  500Hz  1000Hz  2000Hz  3000Hz  4000Hz  6000Hz  8000Hz   Right ear:     100      Left ear:     100      Comments: Hears good   Vision Screening Comments: Dr. Sabra Heck q 2 to 3 years  Recommended q 2 years  Dietary issues and exercise activities discussed: Current Exercise Habits: Home exercise routine, Type of exercise: walking, Time (Minutes): 60, Frequency (Times/Week): 5, Weekly Exercise (Minutes/Week): 300, Intensity: Moderate  Goals    . Weight (lb) < 200 lb (90.7 kg)     Did watch what he eat  Check out  online nutrition programs as GumSearch.nl and http://vang.com/; fit10me; Look for foods with "whole" wheat; bran; oatmeal etc Shot at the farmer's markets in season for fresher choices  Watch for "hydrogenated" on the label of oils which are trans-fats.  Watch for "high fructose corn syrup" in snacks, yogurt or ketchup  Meats have less marbling; bright colored fruits and vegetables;  Canned; dump out liquid and wash vegetables. Be mindful of what  we are eating  Portion control is essential to a health weight! Sit down; take a break and enjoy your meal; take smaller bites; put the fork down between bites;  It takes 20 minutes to get full; so check in with your fullness cues and stop eating when you start to fill full             Depression Screen PHQ 2/9 Scores 07/09/2018 04/01/2017 01/09/2016  PHQ - 2 Score 0 0 0    Fall Risk Fall Risk  07/09/2018 04/01/2017 01/09/2016  Falls in the past year? No No No      Cognitive Function: MMSE - Mini Mental State Exam 07/09/2018  Not completed: (No Data)   Ad8 score reviewed for issues:  Issues making decisions:  Less interest in hobbies / activities:  Repeats questions, stories (family complaining):  Trouble using ordinary gadgets (microwave, computer, phone):  Forgets the month or year:   Mismanaging finances:   Remembering appts:  Daily problems with thinking and/or memory: Ad8 score is=          Screening Tests Health Maintenance  Topic Date Due  . INFLUENZA VACCINE  05/21/2018  . Hepatitis C Screening  10/17/2098 (Originally [birth year])  . TETANUS/TDAP  12/12/2019  . COLONOSCOPY  10/27/2020  . PNA vac Low Risk Adult  Completed         Plan:      PCP Notes   Health Maintenance Educated regarding the shingrix  Took high dose flu vaccine today  Abnormal Screens  bP elevated but had not had BP meds this am. Down to 136 2  Md apt in [place]  Referrals  None May have hearing screen if he choose to   Patient concerns; Set a goal to try and lose 20lb  Nurse Concerns; As noted  Next PCP apt 10/26/2018       I have personally reviewed and noted the following in the patient's chart:   . Medical and social history . Use of alcohol, tobacco or illicit drugs  . Current medications and supplements . Functional ability and status . Nutritional status . Physical activity . Advanced directives . List of other physicians . Hospitalizations,  surgeries, and ER visits in previous 12 months . Vitals . Screenings to include cognitive, depression, and falls . Referrals and appointments  In addition, I have reviewed and discussed with patient certain preventive protocols, quality metrics, and best practice recommendations. A written personalized care plan for preventive services as well as general preventive health recommendations were provided to patient.     Wynetta Fines, RN   07/09/2018

## 2018-07-09 ENCOUNTER — Ambulatory Visit (INDEPENDENT_AMBULATORY_CARE_PROVIDER_SITE_OTHER): Payer: Medicare Other

## 2018-07-09 ENCOUNTER — Encounter: Payer: Self-pay | Admitting: Family Medicine

## 2018-07-09 DIAGNOSIS — Z23 Encounter for immunization: Secondary | ICD-10-CM | POA: Diagnosis not present

## 2018-07-09 DIAGNOSIS — Z0000 Encounter for general adult medical examination without abnormal findings: Secondary | ICD-10-CM | POA: Diagnosis not present

## 2018-07-09 NOTE — Progress Notes (Signed)
I have reviewed and agree with note, evaluation, plan. Glad Bp came down on repeat. Glad patient had flu shot and thankful for advice on 20 lbs weight loss.   Garret Reddish, MD

## 2018-07-09 NOTE — Patient Instructions (Addendum)
Larry Williamson , Thank you for taking time to come for your Medicare Wellness Visit. I appreciate your ongoing commitment to your health goals. Please review the following plan we discussed and let me know if I can assist you in the future.    Shingrix is a vaccine for the prevention of Shingles in Adults 50 and older.  If you are on Medicare, the shingrix is covered under your Part D plan, so you will take both of the vaccines in the series at your pharmacy. Please check with your benefits regarding applicable copays or out of pocket expenses.  The Shingrix is given in 2 vaccines approx 8 weeks apart. You must receive the 2nd dose prior to 6 months from receipt of the first. Please have the pharmacist print out you Immunization  dates for our office records    These are the goals we discussed: Goals    . Weight (lb) < 200 lb (90.7 kg)     Did watch what he eat  Check out  online nutrition programs as GumSearch.nl and http://vang.com/; fit67me; Look for foods with "whole" wheat; bran; oatmeal etc Shot at the farmer's markets in season for fresher choices  Watch for "hydrogenated" on the label of oils which are trans-fats.  Watch for "high fructose corn syrup" in snacks, yogurt or ketchup  Meats have less marbling; bright colored fruits and vegetables;  Canned; dump out liquid and wash vegetables. Be mindful of what we are eating  Portion control is essential to a health weight! Sit down; take a break and enjoy your meal; take smaller bites; put the fork down between bites;  It takes 20 minutes to get full; so check in with your fullness cues and stop eating when you start to fill full              This is a list of the screening recommended for you and due dates:  Health Maintenance  Topic Date Due  . Flu Shot  05/21/2018  .  Hepatitis C: One time screening is recommended by Center for Disease Control  (CDC) for  adults born from [birth year] through 1965.   10/17/2098*  . Tetanus  Vaccine  12/12/2019  . Colon Cancer Screening  10/27/2020  . Pneumonia vaccines  Completed  *Topic was postponed. The date shown is not the original due date.      Fall Prevention in the Home Falls can cause injuries. They can happen to people of all ages. There are many things you can do to make your home safe and to help prevent falls. What can I do on the outside of my home?  Regularly fix the edges of walkways and driveways and fix any cracks.  Remove anything that might make you trip as you walk through a door, such as a raised step or threshold.  Trim any bushes or trees on the path to your home.  Use bright outdoor lighting.  Clear any walking paths of anything that might make someone trip, such as rocks or tools.  Regularly check to see if handrails are loose or broken. Make sure that both sides of any steps have handrails.  Any raised decks and porches should have guardrails on the edges.  Have any leaves, snow, or ice cleared regularly.  Use sand or salt on walking paths during winter.  Clean up any spills in your garage right away. This includes oil or grease spills. What can I do in the bathroom?  Use night lights.  Install grab bars by the toilet and in the tub and shower. Do not use towel bars as grab bars.  Use non-skid mats or decals in the tub or shower.  If you need to sit down in the shower, use a plastic, non-slip stool.  Keep the floor dry. Clean up any water that spills on the floor as soon as it happens.  Remove soap buildup in the tub or shower regularly.  Attach bath mats securely with double-sided non-slip rug tape.  Do not have throw rugs and other things on the floor that can make you trip. What can I do in the bedroom?  Use night lights.  Make sure that you have a light by your bed that is easy to reach.  Do not use any sheets or blankets that are too big for your bed. They should not hang down onto the floor.  Have a firm chair  that has side arms. You can use this for support while you get dressed.  Do not have throw rugs and other things on the floor that can make you trip. What can I do in the kitchen?  Clean up any spills right away.  Avoid walking on wet floors.  Keep items that you use a lot in easy-to-reach places.  If you need to reach something above you, use a strong step stool that has a grab bar.  Keep electrical cords out of the way.  Do not use floor polish or wax that makes floors slippery. If you must use wax, use non-skid floor wax.  Do not have throw rugs and other things on the floor that can make you trip. What can I do with my stairs?  Do not leave any items on the stairs.  Make sure that there are handrails on both sides of the stairs and use them. Fix handrails that are broken or loose. Make sure that handrails are as long as the stairways.  Check any carpeting to make sure that it is firmly attached to the stairs. Fix any carpet that is loose or worn.  Avoid having throw rugs at the top or bottom of the stairs. If you do have throw rugs, attach them to the floor with carpet tape.  Make sure that you have a light switch at the top of the stairs and the bottom of the stairs. If you do not have them, ask someone to add them for you. What else can I do to help prevent falls?  Wear shoes that: ? Do not have high heels. ? Have rubber bottoms. ? Are comfortable and fit you well. ? Are closed at the toe. Do not wear sandals.  If you use a stepladder: ? Make sure that it is fully opened. Do not climb a closed stepladder. ? Make sure that both sides of the stepladder are locked into place. ? Ask someone to hold it for you, if possible.  Clearly mark and make sure that you can see: ? Any grab bars or handrails. ? First and last steps. ? Where the edge of each step is.  Use tools that help you move around (mobility aids) if they are needed. These  include: ? Canes. ? Walkers. ? Scooters. ? Crutches.  Turn on the lights when you go into a dark area. Replace any light bulbs as soon as they burn out.  Set up your furniture so you have a clear path. Avoid moving your furniture around.  If any of your floors are  uneven, fix them.  If there are any pets around you, be aware of where they are.  Review your medicines with your doctor. Some medicines can make you feel dizzy. This can increase your chance of falling. Ask your doctor what other things that you can do to help prevent falls. This information is not intended to replace advice given to you by your health care provider. Make sure you discuss any questions you have with your health care provider. Document Released: 08/03/2009 Document Revised: 03/14/2016 Document Reviewed: 11/11/2014 Elsevier Interactive Patient Education  2018 [place] Maintenance, Male A healthy lifestyle and preventive care is important for your health and wellness. Ask your health care provider about what schedule of regular examinations is right for you. What should I know about weight and diet? Eat a Healthy Diet  Eat plenty of vegetables, fruits, whole grains, low-fat dairy products, and lean protein.  Do not eat a lot of foods high in solid fats, added sugars, or salt.  Maintain a Healthy Weight Regular exercise can help you achieve or maintain a healthy weight. You should:  Do at least 150 minutes of exercise each week. The exercise should increase your heart rate and make you sweat (moderate-intensity exercise).  Do strength-training exercises at least twice a week.  Watch Your Levels of Cholesterol and Blood Lipids  Have your blood tested for lipids and cholesterol every 5 years starting at 68 years of age. If you are at high risk for heart disease, you should start having your blood tested when you are 68 years old. You may need to have your cholesterol levels checked more  often if: ? Your lipid or cholesterol levels are high. ? You are older than 68 years of age. ? You are at high risk for heart disease.  What should I know about cancer screening? Many types of cancers can be detected early and may often be prevented. Lung Cancer  You should be screened every year for lung cancer if: ? You are a current smoker who has smoked for at least 30 years. ? You are a former smoker who has quit within the past 15 years.  Talk to your health care provider about your screening options, when you should start screening, and how often you should be screened.  Colorectal Cancer  Routine colorectal cancer screening usually begins at 68 years of age and should be repeated every 5-10 years until you are 68 years old. You may need to be screened more often if early forms of precancerous polyps or small growths are found. Your health care provider may recommend screening at an earlier age if you have risk factors for colon cancer.  Your health care provider may recommend using home test kits to check for hidden blood in the stool.  A small camera at the end of a tube can be used to examine your colon (sigmoidoscopy or colonoscopy). This checks for the earliest forms of colorectal cancer.  Prostate and Testicular Cancer  Depending on your age and overall health, your health care provider may do certain tests to screen for prostate and testicular cancer.  Talk to your health care provider about any symptoms or concerns you have about testicular or prostate cancer.  Skin Cancer  Check your skin from head to toe regularly.  Tell your health care provider about any new moles or changes in moles, especially if: ? There is a change in a mole's size, shape, or color. ? You have  a mole that is larger than a pencil eraser.  Always use sunscreen. Apply sunscreen liberally and repeat throughout the day.  Protect yourself by wearing long sleeves, pants, a wide-brimmed hat, and  sunglasses when outside.  What should I know about heart disease, diabetes, and high blood pressure?  If you are 34-74 years of age, have your blood pressure checked every 3-5 years. If you are 56 years of age or older, have your blood pressure checked every year. You should have your blood pressure measured twice-once when you are at a hospital or clinic, and once when you are not at a hospital or clinic. Record the average of the two measurements. To check your blood pressure when you are not at a hospital or clinic, you can use: ? An automated blood pressure machine at a pharmacy. ? A home blood pressure monitor.  Talk to your health care provider about your target blood pressure.  If you are between 50-41 years old, ask your health care provider if you should take aspirin to prevent heart disease.  Have regular diabetes screenings by checking your fasting blood sugar level. ? If you are at a normal weight and have a low risk for diabetes, have this test once every three years after the age of 74. ? If you are overweight and have a high risk for diabetes, consider being tested at a younger age or more often.  A one-time screening for abdominal aortic aneurysm (AAA) by ultrasound is recommended for men aged 55-75 years who are current or former smokers. What should I know about preventing infection? Hepatitis B If you have a higher risk for hepatitis B, you should be screened for this virus. Talk with your health care provider to find out if you are at risk for hepatitis B infection. Hepatitis C Blood testing is recommended for:  Everyone born from [birth year] through 1966.  Anyone with known risk factors for hepatitis C.  Sexually Transmitted Diseases (STDs)  You should be screened each year for STDs including gonorrhea and chlamydia if: ? You are sexually active and are younger than 68 years of age. ? You are older than 68 years of age and your health care provider tells you that you are  at risk for this type of infection. ? Your sexual activity has changed since you were last screened and you are at an increased risk for chlamydia or gonorrhea. Ask your health care provider if you are at risk.  Talk with your health care provider about whether you are at high risk of being infected with HIV. Your health care provider may recommend a prescription medicine to help prevent HIV infection.  What else can I do?  Schedule regular health, dental, and eye exams.  Stay current with your vaccines (immunizations).  Do not use any tobacco products, such as cigarettes, chewing tobacco, and e-cigarettes. If you need help quitting, ask your health care provider.  Limit alcohol intake to no more than 2 drinks per day. One drink equals 12 ounces of beer, 5 ounces of wine, or 1 ounces of hard liquor.  Do not use street drugs.  Do not share needles.  Ask your health care provider for help if you need support or information about quitting drugs.  Tell your health care provider if you often feel depressed.  Tell your health care provider if you have ever been abused or do not feel safe at home. This information is not intended to replace advice given to you  by your health care provider. Make sure you discuss any questions you have with your health care provider. Document Released: 04/04/2008 Document Revised: 06/05/2016 Document Reviewed: 07/11/2015 Elsevier Interactive Patient Education  [employer].

## 2018-08-03 ENCOUNTER — Other Ambulatory Visit: Payer: Self-pay | Admitting: Family Medicine

## 2018-08-18 ENCOUNTER — Other Ambulatory Visit: Payer: Self-pay | Admitting: Family Medicine

## 2018-09-29 ENCOUNTER — Other Ambulatory Visit: Payer: Self-pay

## 2018-09-29 MED ORDER — OMEPRAZOLE 40 MG PO CPDR: DELAYED_RELEASE_CAPSULE | ORAL | 0 refills | Status: DC

## 2018-10-01 ENCOUNTER — Other Ambulatory Visit: Payer: Self-pay | Admitting: Family Medicine

## 2018-10-01 DIAGNOSIS — H93299 Other abnormal auditory perceptions, unspecified ear: Secondary | ICD-10-CM | POA: Diagnosis not present

## 2018-10-01 DIAGNOSIS — H6123 Impacted cerumen, bilateral: Secondary | ICD-10-CM | POA: Diagnosis not present

## 2018-10-01 DIAGNOSIS — H9319 Tinnitus, unspecified ear: Secondary | ICD-10-CM | POA: Diagnosis not present

## 2018-10-26 ENCOUNTER — Ambulatory Visit (INDEPENDENT_AMBULATORY_CARE_PROVIDER_SITE_OTHER): Payer: Medicare Other | Admitting: Family Medicine

## 2018-10-26 ENCOUNTER — Encounter: Payer: Self-pay | Admitting: Family Medicine

## 2018-10-26 VITALS — BP 136/78 | HR 75 | Temp 98.4°F | Ht 69.5 in | Wt 229.2 lb

## 2018-10-26 DIAGNOSIS — Z125 Encounter for screening for malignant neoplasm of prostate: Secondary | ICD-10-CM

## 2018-10-26 DIAGNOSIS — Z0000 Encounter for general adult medical examination without abnormal findings: Secondary | ICD-10-CM | POA: Diagnosis not present

## 2018-10-26 DIAGNOSIS — E785 Hyperlipidemia, unspecified: Secondary | ICD-10-CM

## 2018-10-26 DIAGNOSIS — Z79899 Other long term (current) drug therapy: Secondary | ICD-10-CM | POA: Diagnosis not present

## 2018-10-26 DIAGNOSIS — I714 Abdominal aortic aneurysm, without rupture, unspecified: Secondary | ICD-10-CM

## 2018-10-26 DIAGNOSIS — R739 Hyperglycemia, unspecified: Secondary | ICD-10-CM

## 2018-10-26 LAB — CBC
HCT: 46.6 % (ref 39.0–52.0)
Hemoglobin: 16.3 g/dL (ref 13.0–17.0)
MCHC: 34.9 g/dL (ref 30.0–36.0)
MCV: 87.6 fl (ref 78.0–100.0)
Platelets: 199 10*3/uL (ref 150.0–400.0)
RBC: 5.32 Mil/uL (ref 4.22–5.81)
RDW: 13 % (ref 11.5–15.5)
WBC: 7.4 10*3/uL (ref 4.0–10.5)

## 2018-10-26 LAB — COMPREHENSIVE METABOLIC PANEL
ALT: 50 U/L (ref 0–53)
AST: 31 U/L (ref 0–37)
Albumin: 4.6 g/dL (ref 3.5–5.2)
Alkaline Phosphatase: 36 U/L — ABNORMAL LOW (ref 39–117)
BUN: 19 mg/dL (ref 6–23)
CO2: 23 mEq/L (ref 19–32)
Calcium: 9.8 mg/dL (ref 8.4–10.5)
Chloride: 101 mEq/L (ref 96–112)
Creatinine, Ser: 1.07 mg/dL (ref 0.40–1.50)
GFR: 73 mL/min (ref >60.00)
Glucose, Bld: 97 mg/dL (ref 70–99)
POTASSIUM: 4.6 meq/L (ref 3.5–5.1)
SODIUM: 135 meq/L (ref 135–145)
Total Bilirubin: 1 mg/dL (ref 0.2–1.2)
Total Protein: 7.5 g/dL (ref 6.0–8.3)

## 2018-10-26 LAB — LIPID PANEL
Cholesterol: 167 mg/dL (ref 0–200)
HDL: 43.4 mg/dL (ref >39.00)
LDL Cholesterol: 90 mg/dL (ref 0–99)
NONHDL: 123.66
Total CHOL/HDL Ratio: 4
Triglycerides: 167 mg/dL — ABNORMAL HIGH (ref 0.0–149.0)
VLDL: 33.4 mg/dL (ref 0.0–40.0)

## 2018-10-26 LAB — URINALYSIS
Bilirubin Urine: NEGATIVE
Hgb urine dipstick: NEGATIVE
Ketones, ur: NEGATIVE
Leukocytes, UA: NEGATIVE
Nitrite: NEGATIVE
Specific Gravity, Urine: 1.015 (ref 1.000–1.030)
Total Protein, Urine: NEGATIVE
UROBILINOGEN UA: 0.2 (ref 0.0–1.0)
Urine Glucose: NEGATIVE
pH: 6 (ref 5.0–8.0)

## 2018-10-26 LAB — HEMOGLOBIN A1C: Hgb A1c MFr Bld: 5.1 % (ref 4.6–6.5)

## 2018-10-26 LAB — PSA: PSA: 0.9 ng/mL (ref 0.10–4.00)

## 2018-10-26 LAB — VITAMIN B12: Vitamin B-12: 195 pg/mL — ABNORMAL LOW (ref 211–911)

## 2018-10-26 MED ORDER — TIZANIDINE HCL 2 MG PO CAPS: 2.0000 mg | ORAL_CAPSULE | Freq: Two times a day (BID) | ORAL | 2 refills | Status: DC | PRN

## 2018-10-26 MED ORDER — SILDENAFIL CITRATE 100 MG PO TABS: 100.0000 mg | ORAL_TABLET | Freq: Every day | ORAL | 5 refills | Status: DC | PRN

## 2018-10-26 NOTE — Patient Instructions (Addendum)
Please stop by lab before you go  Try half tablet of viagra instead of cialis. If this doesn't work- can take full tablet. Not taking cialis off your list yet until we know viagra is helpful  I sent in tizanidine for muscle spasms  Your blood sugar last year suggested some increased risk of diabetes- hope this has improved with you being down 11 lbs and cutting down on sugar/bread. Hopefully you can continue this and we can see further progress. I love your idea of doing some walking outside of work.   Please check with your pharmacy to see if they have the shingrix vaccine. If they do- please get this immunization and update [place] by phone call or mychart with dates you receive the vaccine

## 2018-10-26 NOTE — Progress Notes (Signed)
Phone: [phone]  Subjective:  Patient presents today for their annual physical. Chief complaint-noted.   See problem oriented charting- ROS- full  review of systems was completed and negative except for: sinus pressure and sneezing- he has this pretty much year round  The following were reviewed and entered/updated in epic: Past Medical History:  Diagnosis Date  . Adenomatous colon polyp 06/2006  . ALLERGIC RHINITIS   . Allergy   . Anemia   . ANEMIA DUE TO DIETARY IRON DEFICIENCY 05/28/2007   Due to giving regularly giving blood. Resolved with cutting in half.     . COMPRESSION FRACTURE, THORACIC VERTEBRA 03/09/2008   2009, no chronic pain   . Diverticulitis of colon   . DIVERTICULOSIS, COLON 05/28/2007   Qualifier: Diagnosis of  By: Arnoldo Morale MD, Balinda Quails   . GERD (gastroesophageal reflux disease)   . Hyperlipidemia   . Hypertension   . Ulcer 1992   gastric   Patient Active Problem List   Diagnosis Date Noted  . AAA (abdominal aortic aneurysm) ([place]) 01/18/2016    Priority: High  . Hyperglycemia 10/01/2016    Priority: Medium  . Meralgia paresthetica 12/28/2014    Priority: Medium  . Hyperlipidemia 05/15/2007    Priority: Medium  . Essential hypertension 05/15/2007    Priority: Medium  . History of skin cancer 12/28/2014    Priority: Low  . Former smoker 12/28/2014    Priority: Low  . Abnormal liver enzymes 12/29/2010    Priority: Low  . Allergic rhinitis 06/17/2008    Priority: Low  . ERECTILE DYSFUNCTION 12/07/2007    Priority: Low  . History of colonic polyps 12/07/2007    Priority: Low  . Umbilical hernia 15/40/0867    Priority: Low  . GERD 05/15/2007    Priority: Low   Past Surgical History:  Procedure Laterality Date  . COLONOSCOPY  01/2017   hx polyps/tics/hems  . COLONOSCOPY W/ POLYPECTOMY    . FOOT SURGERY Right    pain scraper several inches into foot  . POLYPECTOMY      Family History  Problem Relation Age of Onset  . Cancer Father    lung, former smoker, [place] lung in [place]  . Dementia Mother   . Hemochromatosis Brother   . Colon cancer Neg Hx   . Esophageal cancer Neg Hx   . Rectal cancer Neg Hx   . Stomach cancer Neg Hx   . Colon polyps Neg Hx     Medications- reviewed and updated Current Outpatient Medications  Medication Sig Dispense Refill  . aspirin 81 MG tablet Take 81 mg by mouth daily.      [name] gabapentin (NEURONTIN) 100 MG capsule TAKE ONE CAPSULE BY MOUTH EVERY NIGHT AT BEDTIME 30 capsule 5  . irbesartan (AVAPRO) 300 MG tablet TAKE 1 TABLET BY MOUTH EVERY DAY 90 tablet 1  . MULTIPLE VITAMIN PO Take by mouth daily.      . Omega-3 Fatty Acids (FISH OIL) 1000 MG CAPS Take 2 capsules by mouth 2 (two) times daily.      [name] omeprazole (PRILOSEC) 40 MG capsule TAKE 1 CAPSULE BY MOUTH EVERY DAY 90 capsule 1  . pravastatin (PRAVACHOL) 40 MG tablet TAKE 1 TABLET BY MOUTH EVERY DAY 90 tablet 0  . tadalafil (CIALIS) 20 MG tablet Take 0.5-1 tablets (10-20 mg total) by mouth every other day as needed for erectile dysfunction. 5 tablet 11  . tizanidine (ZANAFLEX) 2 MG capsule Take 1 capsule (2 mg total) by mouth 2 (  two) times daily as needed for muscle spasms. 40 capsule 2  . sildenafil (VIAGRA) 100 MG tablet Take 1 tablet (100 mg total) by mouth daily as needed for erectile dysfunction. 10 tablet 5   No current facility-administered medications for this visit.     Allergies-reviewed and updated No Known Allergies  Social History   Social History Narrative   Married (wife pt of Dr. Maudie Mercury). 5 children (2 by first wife, 3 stepkids), 11 granddaughters + 2 greatgrandsons.       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 Oropharynx normal Neck: no thyromegaly CV: RRR no murmurs rubs or gallops Lungs: CTAB no crackles, wheeze, rhonchi Abdomen: soft/nontender/nondistended/normal bowel sounds. No rebound or guarding. Umbilical hernia noted. Obesity noted.  Ext: no edema Skin: warm, dry, < 1 cm papule on right shoulder- asked him to see his dermatologist about this Neuro: grossly normal, moves all extremities, PERRLA  Assessment/Plan:  68 y.o. male presenting for annual physical.  Health Maintenance counseling: 1. Anticipatory guidance: Patient counseled regarding regular dental exams -q6 months, eye exams does these at least every other year,  avoiding smoking and second hand smoke, limiting alcohol to 2 beverages per day - has bloody mary with v8 most days.   2. Risk factor reduction:  Advised patient of need for regular exercise and diet rich and fruits and vegetables to reduce risk of heart attack and stroke. Exercise- active with work. Encouraged still outsie of work looking for 150 minutes a week exercise.  Diet-he was 239 at his last physical- he has lost some weight and states he has cut down on sweets and breads.  Wt Readings from Last 3 Encounters:  10/26/18 229 lb 3.2 oz (104 kg)  07/09/18 227 lb (103 kg)  10/27/17 238 lb (108 kg)  3. Immunizations/screenings/ancillary studies-discussed Shingri at pharmacy or [employer] History  Administered Date(s) Administered  . Influenza Split 07/30/2011, 07/10/2012  . Influenza Whole 08/04/2007, 07/21/2008, 07/18/2009, 07/16/2010  . Influenza, High Dose Seasonal PF 07/16/2016, 07/17/2017, 07/09/2018  . Influenza,inj,Quad PF,6+ Mos 07/05/2013, 08/03/2014, 07/13/2015  . Pneumococcal Conjugate-13 01/09/2016  . Pneumococcal Polysaccharide-23 04/01/2017  . Td 10/22/1999, 12/11/2009   4. Prostate cancer screening- prior low risk trend-update PSA today.  Repeat rectal only if  concerning trend on PSA Lab Results  Component Value Date   PSA 0.99 01/21/2018   PSA 1.74 10/22/2017   PSA 1.15 09/26/2016   5. Colon cancer screening - colonoscopy 10/27/2017 with 3-year follow-up due to adenomatous colon polyp history 6. Skin cancer screening-follows with Dr. Denna Haggard yearly or q18 months- advised regular sunscreen use. Denies worrisome, changing, or new skin lesions.  7.  Former smoker-quit in 1980.  Already known AAA  Status of chronic or acute concerns   Patient still dealing with erectile dysfunction-Cialis is minimally elpful,   Meralgia paresthetica bilateral-continues to use gabapentin 100 mg each morning.  Worse with being on concrete.  GERD- patient continues on omeprazole 40 mg, 20 mg didn't work for him.  Will get B12 level today given long-term high risk medication for reducing B12 levels  Hyperlipidemia- will update lipid panel on pravastatin 40 mg.  Hopeful LDL at least under 100  Hypertension- remains at goal on irbesartan 300 mg   Hyperglycemia- we will update CBG as well as hemoglobin A1c- last years a1c was ok even with high cbg. One more reason weight loss is important.   Gets some muscle spasms in trunk area- has used tizanidine 2mg  in past through Dr. Erlinda Hong of orthopedics- req  AAA (abdominal aortic aneurysm) ([place]) In 2017 patient was noted to have fusiform AAA measuring 2.9 x 3.6 cm-follow-up exam in July 2018 showed improvement.  Plan is for 2-year repeat.  We will repeat AAA scan July 2020 or later  No follow-ups on file.  Lab/Order associations:FASTING  Preventative health care - Plan: Urinalysis, Hemoglobin A1c, CBC, Comprehensive metabolic panel, Lipid panel, PSA, Vitamin B12  Hyperlipidemia, unspecified hyperlipidemia type - Plan: Urinalysis, CBC, Comprehensive metabolic panel, Lipid panel  Screening for prostate cancer - Plan: PSA  Hyperglycemia - Plan: Hemoglobin A1c  Abdominal aortic aneurysm (AAA) without rupture ([facility])  High risk  medication use - Plan: Vitamin B12  Meds ordered this encounter  Medications  . tizanidine (ZANAFLEX) 2 MG capsule    Sig: Take 1 capsule (2 mg total) by mouth 2 (two) times daily as needed for muscle spasms.    Dispense:  40 capsule    Refill:  2  . sildenafil (VIAGRA) 100 MG tablet    Sig: Take 1 tablet (100 mg total) by mouth daily as needed for erectile dysfunction.    Dispense:  10 tablet    Refill:  5    Return precautions advised.  Garret Reddish, MD

## 2018-10-26 NOTE — Addendum Note (Signed)
Addended by: Kayren Eaves T on: 10/26/2018 09:26 AM   Modules accepted: Orders

## 2018-10-26 NOTE — Assessment & Plan Note (Signed)
In 2017 patient was noted to have fusiform AAA measuring 2.9 x 3.6 cm-follow-up exam in July 2018 showed improvement.  Plan is for 2-year repeat.  We will repeat AAA scan July 2020 or later

## 2018-10-28 ENCOUNTER — Telehealth: Payer: Self-pay

## 2018-10-28 NOTE — Telephone Encounter (Signed)
PA required for Tizanidine 2 mg cap, BID Qty 40  Initiated via [email or web address]  Melton Krebs (Key: [id number])   [employer] is reviewing your PA request. Typically an electronic response will be received within 72 hours. To check for an update later, open this request from your dashboard. You may close this dialog and return to your dashboard to perform other tasks.

## 2018-10-29 ENCOUNTER — Telehealth: Payer: Self-pay | Admitting: Family Medicine

## 2018-10-29 NOTE — Telephone Encounter (Signed)
Received a notice from [employer] that tizanidine (ZANAFLEX) 2 MG capsule is approved for non-formulary exception through 10/21/2019 under Medicare Part D benefit.

## 2018-10-29 NOTE — Telephone Encounter (Signed)
See note  Copied from [place] [phone]. Topic: General - Other >> Oct 29, 2018 10:33 AM Judyann Munson wrote: Reason for CRM: patient stated he missed a call from the office, pt is unsure who called. Please advise

## 2018-10-30 NOTE — Telephone Encounter (Signed)
Called and left a detailed voicemail advising patient to call and schedule a nurse visit to initiate the B12 protocol or if he preferred he can take the oral pills. His oral daily dosage would be 1068mcg daily. I provided a call back number for questions.

## 2018-10-30 NOTE — Telephone Encounter (Signed)
Patient wife is calling to requesting  The patient have a B12. The best contact number is [phone]. Please advise

## 2018-10-30 NOTE — Telephone Encounter (Signed)
See note

## 2018-11-04 ENCOUNTER — Encounter: Payer: Self-pay | Admitting: *Deleted

## 2018-11-04 ENCOUNTER — Ambulatory Visit (INDEPENDENT_AMBULATORY_CARE_PROVIDER_SITE_OTHER): Payer: Medicare Other | Admitting: *Deleted

## 2018-11-04 DIAGNOSIS — E538 Deficiency of other specified B group vitamins: Secondary | ICD-10-CM

## 2018-11-04 MED ORDER — CYANOCOBALAMIN 1000 MCG/ML IJ SOLN
1000.0000 ug | Freq: Once | INTRAMUSCULAR | Status: AC
  Administered 2018-11-04: 1000 ug via INTRAMUSCULAR

## 2018-11-04 NOTE — Progress Notes (Signed)
Per orders of Dr. Yong Channel, injection of Cyanocobalamin 1000 mcg IM given Left Deltoid by Anselmo Pickler, LPN Patient tolerated injection well. Pt told to return in one week for next injection. Pt verbalized understanding.

## 2018-11-10 ENCOUNTER — Other Ambulatory Visit: Payer: Self-pay | Admitting: Dermatology

## 2018-11-10 DIAGNOSIS — Z85820 Personal history of malignant melanoma of skin: Secondary | ICD-10-CM | POA: Diagnosis not present

## 2018-11-10 DIAGNOSIS — D0461 Carcinoma in situ of skin of right upper limb, including shoulder: Secondary | ICD-10-CM | POA: Diagnosis not present

## 2018-11-10 DIAGNOSIS — D099 Carcinoma in situ, unspecified: Secondary | ICD-10-CM

## 2018-11-10 DIAGNOSIS — D0471 Carcinoma in situ of skin of right lower limb, including hip: Secondary | ICD-10-CM | POA: Diagnosis not present

## 2018-11-10 DIAGNOSIS — L570 Actinic keratosis: Secondary | ICD-10-CM | POA: Diagnosis not present

## 2018-11-10 DIAGNOSIS — D229 Melanocytic nevi, unspecified: Secondary | ICD-10-CM | POA: Diagnosis not present

## 2018-11-10 HISTORY — DX: Carcinoma in situ, unspecified: D09.9

## 2018-11-12 ENCOUNTER — Ambulatory Visit (INDEPENDENT_AMBULATORY_CARE_PROVIDER_SITE_OTHER): Payer: Medicare Other

## 2018-11-12 DIAGNOSIS — E538 Deficiency of other specified B group vitamins: Secondary | ICD-10-CM

## 2018-11-12 MED ORDER — CYANOCOBALAMIN 1000 MCG/ML IJ SOLN
1000.0000 ug | Freq: Once | INTRAMUSCULAR | Status: AC
  Administered 2018-11-12: 1000 ug via INTRAMUSCULAR

## 2018-11-12 NOTE — Progress Notes (Signed)
Per orders of Dr.Hunter, injection of Hunter  given by Francella Solian right deltoid. Patient tolerated injection well.

## 2018-11-14 ENCOUNTER — Other Ambulatory Visit: Payer: Self-pay | Admitting: Family Medicine

## 2018-11-19 ENCOUNTER — Ambulatory Visit (INDEPENDENT_AMBULATORY_CARE_PROVIDER_SITE_OTHER): Payer: Medicare Other

## 2018-11-19 DIAGNOSIS — E538 Deficiency of other specified B group vitamins: Secondary | ICD-10-CM

## 2018-11-19 MED ORDER — CYANOCOBALAMIN 1000 MCG/ML IJ SOLN
1000.0000 ug | Freq: Once | INTRAMUSCULAR | Status: AC
  Administered 2018-11-19: 1000 ug via INTRAMUSCULAR

## 2018-11-19 NOTE — Progress Notes (Signed)
I have reviewed the patient's encounter and agree with the documentation.  Algis Greenhouse. Jerline Pain, MD 11/19/2018 10:41 AM

## 2018-11-19 NOTE — Progress Notes (Signed)
Per orders of Dr. Jerline Pain, injection of Vitamin b12 1000 mcg given in left deltoid IM by Naoma Diener Patient tolerated injection well.  Pt will make his last weekly appt for 1 week from today.

## 2018-11-26 ENCOUNTER — Ambulatory Visit (INDEPENDENT_AMBULATORY_CARE_PROVIDER_SITE_OTHER): Payer: Medicare Other

## 2018-11-26 DIAGNOSIS — D0461 Carcinoma in situ of skin of right upper limb, including shoulder: Secondary | ICD-10-CM | POA: Diagnosis not present

## 2018-11-26 DIAGNOSIS — E538 Deficiency of other specified B group vitamins: Secondary | ICD-10-CM

## 2018-11-26 DIAGNOSIS — D531 Other megaloblastic anemias, not elsewhere classified: Secondary | ICD-10-CM

## 2018-11-26 MED ORDER — CYANOCOBALAMIN 1000 MCG/ML IJ SOLN
1000.0000 ug | Freq: Once | INTRAMUSCULAR | Status: AC
  Administered 2018-11-26: 1000 ug via INTRAMUSCULAR

## 2018-11-26 NOTE — Progress Notes (Signed)
Per orders of Dr. Yong Channel, injection of vitamin B12 1000 mcg given in left deltoid by Gertie Exon, CMA.  Patient tolerated injection well.  Patient requested left deltoid because he will be having a minor surgical procedure on his right arm later today.  He will schedule his next injection for 1 month from now before leaving the office today.

## 2018-12-21 ENCOUNTER — Other Ambulatory Visit: Payer: Self-pay | Admitting: Family Medicine

## 2018-12-31 ENCOUNTER — Other Ambulatory Visit: Payer: Self-pay

## 2018-12-31 ENCOUNTER — Ambulatory Visit (INDEPENDENT_AMBULATORY_CARE_PROVIDER_SITE_OTHER): Payer: Medicare Other | Admitting: *Deleted

## 2018-12-31 ENCOUNTER — Encounter: Payer: Self-pay | Admitting: *Deleted

## 2018-12-31 DIAGNOSIS — E538 Deficiency of other specified B group vitamins: Secondary | ICD-10-CM

## 2018-12-31 MED ORDER — CYANOCOBALAMIN 1000 MCG/ML IJ SOLN
1000.0000 ug | Freq: Once | INTRAMUSCULAR | Status: AC
  Administered 2018-12-31: 1000 ug via INTRAMUSCULAR

## 2018-12-31 NOTE — Progress Notes (Signed)
I have reviewed and agree with note, evaluation, plan.   Marliyah Reid, MD  

## 2018-12-31 NOTE — Progress Notes (Signed)
Per orders of Dr. Yong Channel, injection of Cyanocobalamin 1000 mcg given by Anselmo Pickler, LPN in right deltoid IM. Patient tolerated injection well. Patient will make appointment for 1 month.

## 2019-01-01 DIAGNOSIS — H5203 Hypermetropia, bilateral: Secondary | ICD-10-CM | POA: Diagnosis not present

## 2019-01-01 DIAGNOSIS — H2513 Age-related nuclear cataract, bilateral: Secondary | ICD-10-CM | POA: Diagnosis not present

## 2019-01-12 ENCOUNTER — Other Ambulatory Visit: Payer: Self-pay | Admitting: Family Medicine

## 2019-01-13 ENCOUNTER — Other Ambulatory Visit: Payer: Self-pay | Admitting: Family Medicine

## 2019-01-25 ENCOUNTER — Encounter: Payer: Self-pay | Admitting: Family Medicine

## 2019-01-27 ENCOUNTER — Other Ambulatory Visit: Payer: Self-pay | Admitting: Family Medicine

## 2019-01-28 ENCOUNTER — Encounter: Payer: Self-pay | Admitting: Family Medicine

## 2019-01-28 ENCOUNTER — Other Ambulatory Visit: Payer: Self-pay

## 2019-01-28 ENCOUNTER — Ambulatory Visit (INDEPENDENT_AMBULATORY_CARE_PROVIDER_SITE_OTHER): Payer: Medicare Other | Admitting: Family Medicine

## 2019-01-28 ENCOUNTER — Ambulatory Visit (INDEPENDENT_AMBULATORY_CARE_PROVIDER_SITE_OTHER): Payer: Medicare Other

## 2019-01-28 VITALS — BP 147/74 | HR 66 | Temp 97.4°F

## 2019-01-28 DIAGNOSIS — R1011 Right upper quadrant pain: Secondary | ICD-10-CM

## 2019-01-28 DIAGNOSIS — R1012 Left upper quadrant pain: Secondary | ICD-10-CM | POA: Diagnosis not present

## 2019-01-28 DIAGNOSIS — E538 Deficiency of other specified B group vitamins: Secondary | ICD-10-CM | POA: Diagnosis not present

## 2019-01-28 DIAGNOSIS — I10 Essential (primary) hypertension: Secondary | ICD-10-CM

## 2019-01-28 MED ORDER — CYANOCOBALAMIN 1000 MCG/ML IJ SOLN
1000.0000 ug | Freq: Once | INTRAMUSCULAR | Status: AC
  Administered 2019-01-28: 1000 ug via INTRAMUSCULAR

## 2019-01-28 NOTE — Patient Instructions (Addendum)
Video visit

## 2019-01-28 NOTE — Patient Instructions (Signed)
There are no preventive care reminders to display for this patient.  Depression screen [facility] 2/9 10/26/2018 07/09/2018 04/01/2017  Decreased Interest 0 0 0  Down, Depressed, Hopeless 0 - 0  PHQ - 2 Score 0 0 0

## 2019-01-28 NOTE — Progress Notes (Signed)
Phone [phone]   Subjective:  Virtual visit via Video note. Chief complaint: Chief Complaint  Patient presents with  . Abdominal Cramping    This visit type was conducted due to national recommendations for restrictions regarding the COVID-19 Pandemic (e.g. social distancing).  This format is felt to be most appropriate for this patient at this time balancing risks to patient and risks to population by having him in for in person visit.  All issues noted in this document were discussed and addressed.  No physical exam was performed (except for noted visual exam or audio findings with Telehealth visits).  The patient has consented to conduct a Telehealth visit and understands insurance will be billed.   Our team/I connected with Larry Williamson on 01/28/19 at  1:00 PM EDT by a video enabled telemedicine application ([email or web address]) and verified that I am speaking with the correct person using two identifiers.  Location patient: [place] Location provider: [facility], office Persons participating in the virtual visit:  patient  Our team/I discussed the limitations of evaluation and management by telemedicine and the availability of in person appointments. In light of current covid-19 pandemic, patient also understands that we are trying to protect them by minimizing in office contact if at all possible.  The patient expressed consent for telemedicine visit and agreed to proceed. Patient understands insurance will be billed.   ROS-no chest pain or shortness of breath reported.  Patient able to take a good deep breath without worsening symptoms.  No fever chills or cough reported.  Past Medical History-  Patient Active Problem List   Diagnosis Date Noted  . AAA (abdominal aortic aneurysm) ([place]) 01/18/2016    Priority: High  . Meralgia paresthetica 12/28/2014    Priority: Medium  . Hyperlipidemia 05/15/2007    Priority: Medium  . Essential hypertension 05/15/2007    Priority: Medium  .  Hyperglycemia 10/01/2016    Priority: Low  . History of skin cancer 12/28/2014    Priority: Low  . Former smoker 12/28/2014    Priority: Low  . Abnormal liver enzymes 12/29/2010    Priority: Low  . Allergic rhinitis 06/17/2008    Priority: Low  . ERECTILE DYSFUNCTION 12/07/2007    Priority: Low  . History of colonic polyps 12/07/2007    Priority: Low  . Umbilical hernia 75/64/3329    Priority: Low  . GERD 05/15/2007    Priority: Low    Medications- reviewed and updated Current Outpatient Medications  Medication Sig Dispense Refill  . aspirin 81 MG tablet Take 81 mg by mouth daily.      [name] gabapentin (NEURONTIN) 100 MG capsule TAKE ONE CAPSULE BY MOUTH EVERY NIGHT AT BEDTIME 30 capsule 5  . irbesartan (AVAPRO) 300 MG tablet TAKE 1 TABLET(300 MG) BY MOUTH DAILY 90 tablet 1  . MULTIPLE VITAMIN PO Take by mouth daily.      . Omega-3 Fatty Acids (FISH OIL) 1000 MG CAPS Take 2 capsules by mouth 2 (two) times daily.      [name] omeprazole (PRILOSEC) 40 MG capsule TAKE 1 CAPSULE BY MOUTH EVERY DAY 90 capsule 1  . pravastatin (PRAVACHOL) 40 MG tablet TAKE 1 TABLET BY MOUTH EVERY DAY 90 tablet 0  . sildenafil (VIAGRA) 100 MG tablet Take 1 tablet (100 mg total) by mouth daily as needed for erectile dysfunction. 10 tablet 5  . tadalafil (CIALIS) 20 MG tablet Take 0.5-1 tablets (10-20 mg total) by mouth every other day as needed for erectile dysfunction. 5 tablet 11  .  tizanidine (ZANAFLEX) 2 MG capsule Take 1 capsule (2 mg total) by mouth 2 (two) times daily as needed for muscle spasms. 40 capsule 2   No current facility-administered medications for this visit.      Objective:  BP (!) 147/74   Pulse 66   Temp (!) 97.4 F (36.3 C)  Gen: NAD, resting comfortably Lungs: nonlabored, normal respiratory rate  Skin: appears dry, no obvious rash Overweight, abdomen not clearly distended.  Points to upper abdomen in a band around to the back of him as area of tightness/intermittent cramping      Assessment and Plan   #Upper abdominal cramping in bandlike sensation S:just beneath ribs/upper stomach has noted fair amount of cramping. Has tried to increase hydration  With water and Gatorade and no relief. Seems to be worse on hot days. Standing up from a chair it may just grab him or if he turns a certain way. Somewhat thirsty- peeing a lot with coffee but not a ton the rest of the day.   Has been going on for a few weeks.  Some days does not feel anything at all.  Other days feels like a generalized tightness in upper abdomen and when he turns a certain way it significantly worsens.  May last up to 5 minutes when it occurs.  May recur multiple times of the day. A/P: Unclear cause of cramping sensation.  Patient has had issues with cramping and myalgias on statins in the past-currently on low-dose pravastatin 40 mg.  We opted to trial off this medication for 2 weeks and he will update me by MyChart.  We discussed getting some updated labs like CBC, CMP, urine/UA if symptoms do not improve. -Doubt acid reflux-patient is on omeprazole 40 mg at baseline  #hypertension S: controlled on irbesartan 300 mg at last visit-patient checked blood pressure at home today though it was elevated BP Readings from Last 3 Encounters:  01/28/19 (!) 147/74  10/26/18 136/78  07/09/18 136/70  A/P: Poor control today.  Patient is going to monitor at home over the next 2 weeks and then update me by my chart- would consider adding amlodipine 2.5 mg  Future Appointments  Date Time Provider [place]  03/04/2019  9:00 AM [facility] NURSE [facility]  04/01/2019  9:00 AM [facility] NURSE [facility]  04/29/2019  9:00 AM [facility] NURSE [facility]  06/03/2019  9:00 AM [facility] NURSE [facility]  07/01/2019  9:00 AM [facility] NURSE [facility]  07/29/2019  9:00 AM [facility] NURSE [facility]  09/02/2019  9:00 AM [facility] NURSE [facility]  09/30/2019  9:00 AM [facility] NURSE [facility]  10/28/2019  8:00 AM  Hunter, Brayton Mars, MD [facility]   2-week follow-up by my chart  Return precautions advised.  Garret Reddish, MD

## 2019-01-28 NOTE — Progress Notes (Signed)
Per orders of Dr. Yong Channel, injection of B12 given by Verline Lema L Tyus in left deltoid. Patient tolerated injection well. Patient will make appointment for 41month

## 2019-02-15 ENCOUNTER — Other Ambulatory Visit: Payer: Self-pay | Admitting: Family Medicine

## 2019-03-04 ENCOUNTER — Ambulatory Visit (INDEPENDENT_AMBULATORY_CARE_PROVIDER_SITE_OTHER): Payer: Medicare Other | Admitting: Family Medicine

## 2019-03-04 DIAGNOSIS — E538 Deficiency of other specified B group vitamins: Secondary | ICD-10-CM | POA: Diagnosis not present

## 2019-03-04 MED ORDER — CYANOCOBALAMIN 1000 MCG/ML IJ SOLN
1000.0000 ug | Freq: Once | INTRAMUSCULAR | Status: AC
  Administered 2019-03-04: 1000 ug via INTRAMUSCULAR

## 2019-03-04 NOTE — Progress Notes (Signed)
Per orders of Dr. Yong Channel, injection of B12 given in right arm by Franco Collet. Patient tolerated injection well.   I have reviewed and agree with note, evaluation, plan.   Garret Reddish, MD

## 2019-03-04 NOTE — Patient Instructions (Signed)
There are no preventive care reminders to display for this patient.  Depression screen [facility] 2/9 10/26/2018 07/09/2018 04/01/2017  Decreased Interest 0 0 0  Down, Depressed, Hopeless 0 - 0  PHQ - 2 Score 0 0 0

## 2019-04-01 ENCOUNTER — Ambulatory Visit: Payer: Medicare Other | Admitting: Family Medicine

## 2019-04-01 ENCOUNTER — Ambulatory Visit: Payer: Medicare Other

## 2019-04-06 ENCOUNTER — Other Ambulatory Visit: Payer: Self-pay

## 2019-04-06 ENCOUNTER — Ambulatory Visit (INDEPENDENT_AMBULATORY_CARE_PROVIDER_SITE_OTHER): Payer: Medicare Other

## 2019-04-06 DIAGNOSIS — E538 Deficiency of other specified B group vitamins: Secondary | ICD-10-CM

## 2019-04-06 MED ORDER — CYANOCOBALAMIN 1000 MCG/ML IJ SOLN
1000.0000 ug | Freq: Once | INTRAMUSCULAR | Status: AC
  Administered 2019-04-06: 1000 ug via INTRAMUSCULAR

## 2019-04-06 NOTE — Progress Notes (Signed)
Per orders of Dr. Yong Channel, injection of Vitamin b12 1000 mcg given right deltoid IM by Kevan Ny, CMA Patient tolerated injection well and will return in 1 month for his next injection

## 2019-04-06 NOTE — Progress Notes (Signed)
I have reviewed and agree with note, evaluation, plan.   Abby Tucholski, MD  

## 2019-04-29 ENCOUNTER — Ambulatory Visit: Payer: Medicare Other

## 2019-04-29 ENCOUNTER — Other Ambulatory Visit: Payer: Self-pay

## 2019-04-29 DIAGNOSIS — E538 Deficiency of other specified B group vitamins: Secondary | ICD-10-CM

## 2019-04-29 MED ORDER — CYANOCOBALAMIN 1000 MCG/ML IJ SOLN
1000.0000 ug | Freq: Once | INTRAMUSCULAR | Status: AC
  Administered 2019-04-29: 1000 ug via INTRAMUSCULAR

## 2019-04-29 MED ORDER — CYANOCOBALAMIN 1000 MCG/ML IJ SOLN: 1000.0000 ug | Freq: Once | INTRAMUSCULAR | Status: DC

## 2019-04-29 NOTE — Progress Notes (Addendum)
Per orders of Dr. Yong Channel injection of B12 given in left  deltoid by Sandford Craze. Patient tolerated injection well.

## 2019-06-01 ENCOUNTER — Other Ambulatory Visit: Payer: Self-pay | Admitting: Family Medicine

## 2019-06-02 ENCOUNTER — Ambulatory Visit (INDEPENDENT_AMBULATORY_CARE_PROVIDER_SITE_OTHER): Payer: Medicare Other

## 2019-06-02 ENCOUNTER — Other Ambulatory Visit: Payer: Self-pay

## 2019-06-02 DIAGNOSIS — E538 Deficiency of other specified B group vitamins: Secondary | ICD-10-CM | POA: Diagnosis not present

## 2019-06-02 MED ORDER — CYANOCOBALAMIN 1000 MCG/ML IJ SOLN
1000.0000 ug | Freq: Once | INTRAMUSCULAR | Status: AC
  Administered 2019-06-02: 1000 ug via INTRAMUSCULAR

## 2019-06-02 NOTE — Progress Notes (Signed)
Per orders of Dr. Yong Channel, injection of vitamin B12 1000 mcg given in right deltoid by Gertie Exon, CMA.  Patient tolerated injection well.  He will return in 1 month for his next injection.

## 2019-06-03 ENCOUNTER — Telehealth: Payer: Self-pay

## 2019-06-03 ENCOUNTER — Ambulatory Visit: Payer: Medicare Other

## 2019-06-03 NOTE — Telephone Encounter (Signed)
Marin Olp, MD  Jasper Loser, CMA        He was only supposed to stop the medicine for 2 weeks then restart. He should be on pravastatin now.   I would suggest he simply restart the medicine if he is not taking it-if they would like to see how the numbers look back on the medicine I would wait at least 6 weeks from time of restart.  Thanks, Garret Reddish   Previous Messages  ----- Message -----  From: Jasper Loser, CMA  Sent: 06/02/2019  3:08 PM EDT  To: Marin Olp, MD  Subject: FW: Patient may need cholesterol checked      ----- Message -----  From: Micheline Chapman  Sent: 06/01/2019  8:41 AM EDT  To: Dione Housekeeper  Subject: Patient may need cholesterol checked       Patient's wife called and said that Dr. Yong Channel had taken pt off of cholesterol medication to help with cramping, but now she is worried that he should have his cholesterol checked. I scheduled pt for appt on 06/22/19 but please reach out to pt if he may need to come in sooner, thank you!

## 2019-06-03 NOTE — Telephone Encounter (Signed)
Called pt and left VM to call the office.  

## 2019-06-07 MED ORDER — ROSUVASTATIN CALCIUM 5 MG PO TABS: 5.0000 mg | ORAL_TABLET | ORAL | 3 refills | Status: DC

## 2019-06-07 NOTE — Addendum Note (Signed)
Addended by: Gwenyth Ober R on: 06/07/2019 09:14 AM   Modules accepted: Orders

## 2019-06-07 NOTE — Telephone Encounter (Signed)
Lets try really dose low dosing less frequently- please send in rosuvastatin 5 mg for weekly use #13 with 3 refills-tell him this is a 5-month supply

## 2019-06-07 NOTE — Telephone Encounter (Signed)
Rx sent to the pharmacy Pravastatin d/c. Tried to call pt for update but no answer.

## 2019-06-07 NOTE — Telephone Encounter (Signed)
Spoke to pt and he stated that since he has been off the medication his leg cramping has improved. Pt wants to know if he can try another Rx that will help with his Cholesterol.    Pt has an appt 06/22/2019 if you want me to advise him to wait until appt. For any changes and lab work  Please advise

## 2019-06-17 ENCOUNTER — Other Ambulatory Visit: Payer: Self-pay | Admitting: Family Medicine

## 2019-06-22 ENCOUNTER — Ambulatory Visit (INDEPENDENT_AMBULATORY_CARE_PROVIDER_SITE_OTHER): Payer: Medicare Other | Admitting: Family Medicine

## 2019-06-22 ENCOUNTER — Other Ambulatory Visit: Payer: Self-pay

## 2019-06-22 ENCOUNTER — Encounter: Payer: Self-pay | Admitting: Family Medicine

## 2019-06-22 VITALS — BP 136/72 | HR 69 | Temp 97.2°F | Ht 69.5 in | Wt 233.4 lb

## 2019-06-22 DIAGNOSIS — E663 Overweight: Secondary | ICD-10-CM

## 2019-06-22 DIAGNOSIS — G5710 Meralgia paresthetica, unspecified lower limb: Secondary | ICD-10-CM

## 2019-06-22 DIAGNOSIS — E785 Hyperlipidemia, unspecified: Secondary | ICD-10-CM | POA: Diagnosis not present

## 2019-06-22 DIAGNOSIS — F528 Other sexual dysfunction not due to a substance or known physiological condition: Secondary | ICD-10-CM

## 2019-06-22 DIAGNOSIS — I10 Essential (primary) hypertension: Secondary | ICD-10-CM

## 2019-06-22 DIAGNOSIS — Z23 Encounter for immunization: Secondary | ICD-10-CM | POA: Diagnosis not present

## 2019-06-22 DIAGNOSIS — E538 Deficiency of other specified B group vitamins: Secondary | ICD-10-CM | POA: Diagnosis not present

## 2019-06-22 DIAGNOSIS — I714 Abdominal aortic aneurysm, without rupture, unspecified: Secondary | ICD-10-CM

## 2019-06-22 DIAGNOSIS — K219 Gastro-esophageal reflux disease without esophagitis: Secondary | ICD-10-CM

## 2019-06-22 MED ORDER — IRBESARTAN 300 MG PO TABS: ORAL_TABLET | ORAL | 3 refills | Status: DC

## 2019-06-22 MED ORDER — CYANOCOBALAMIN 1000 MCG/ML IJ SOLN
1000.0000 ug | Freq: Once | INTRAMUSCULAR | Status: AC
  Administered 2019-06-22: 1000 ug via INTRAMUSCULAR

## 2019-06-22 MED ORDER — GABAPENTIN 100 MG PO CAPS: 100.0000 mg | ORAL_CAPSULE | Freq: Every day | ORAL | 3 refills | Status: DC

## 2019-06-22 NOTE — Assessment & Plan Note (Signed)
S:no issues as long as regularly takes gabapentin  A/P: patient taking gabapentin in AM- advised him to trial perhaps a day off here or there- possible symptoms have improved and he can come off

## 2019-06-22 NOTE — Assessment & Plan Note (Signed)
S:Taking Omeprazole 40 mg daily.  Managing sx well. Failed 20 mg effort in past A/P: discussed retrial 20mg  dose in future if patient is able to get back down to 200 or so

## 2019-06-22 NOTE — Progress Notes (Signed)
Phone [phone]   Subjective:  Larry Williamson is a 68 y.o. year old very pleasant male patient who presents for/with See problem oriented charting Chief Complaint  Patient presents with  . Follow-up    Not fasting today.   . Hyperlipidemia  . Hypertension  . Gastroesophageal Reflux  . Erectile Dysfunction   ROS- No chest pain or shortness of breath. No headache or blurry vision.    Past Medical History-  Patient Active Problem List   Diagnosis Date Noted  . AAA (abdominal aortic aneurysm) ([place]) 01/18/2016    Priority: High  . Meralgia paresthetica 12/28/2014    Priority: Medium  . Hyperlipidemia 05/15/2007    Priority: Medium  . Essential hypertension 05/15/2007    Priority: Medium  . Hyperglycemia 10/01/2016    Priority: Low  . History of skin cancer 12/28/2014    Priority: Low  . Former smoker 12/28/2014    Priority: Low  . Abnormal liver enzymes 12/29/2010    Priority: Low  . Allergic rhinitis 06/17/2008    Priority: Low  . ERECTILE DYSFUNCTION 12/07/2007    Priority: Low  . History of colonic polyps 12/07/2007    Priority: Low  . Umbilical hernia [id number]    Priority: Low  . GERD 05/15/2007    Priority: Low    Medications- reviewed and updated Current Outpatient Medications  Medication Sig Dispense Refill  . aspirin 81 MG tablet Take 81 mg by mouth daily.      [name] gabapentin (NEURONTIN) 100 MG capsule Take 1 capsule (100 mg total) by mouth at bedtime. 90 capsule 3  . irbesartan (AVAPRO) 300 MG tablet TAKE 1 TABLET(300 MG) BY MOUTH DAILY 90 tablet 3  . MULTIPLE VITAMIN PO Take by mouth daily.      . Omega-3 Fatty Acids (FISH OIL) 1000 MG CAPS Take 2 capsules by mouth 2 (two) times daily.      [name] omeprazole (PRILOSEC) 40 MG capsule TAKE 1 CAPSULE BY MOUTH EVERY DAY 90 capsule 1  . rosuvastatin (CRESTOR) 5 MG tablet Take 1 tablet (5 mg total) by mouth once a week. 13 tablet 3  . sildenafil (VIAGRA) 100 MG tablet Take 1 tablet (100 mg total) by mouth  daily as needed for erectile dysfunction. 10 tablet 5  . tizanidine (ZANAFLEX) 2 MG capsule Take 1 capsule (2 mg total) by mouth 2 (two) times daily as needed for muscle spasms. 40 capsule 2   No current facility-administered medications for this visit.      Objective:  BP 136/72 (BP Location: Left Arm, Patient Position: Sitting, Cuff Size: Normal)   Pulse 69   Temp (!) 97.2 F (36.2 C) (Temporal)   Ht 5' 9.5" (1.765 m)   Wt 233 lb 6.4 oz (105.9 kg)   SpO2 96%   BMI 33.97 kg/m  Gen: NAD, resting comfortably CV: RRR no murmurs rubs or gallops Lungs: CTAB no crackles, wheeze, rhonchi Abdomen: soft/nontender/nondistended/normal bowel sounds.  Ext: no edema Skin: warm, dry     Assessment and Plan   #hyperlipidemia S: bad cramps on pravastatin. Now changed over and hopefully controlled on Rosuvastatin 5 mg once weekly. Has been back on Rosuvastatin for 3 weeks. No myalgias so far.  Also taking Omega 3 supplement.  Lab Results  Component Value Date   CHOL 167 10/26/2018   HDL 43.40 10/26/2018   LDLCALC 90 10/26/2018   LDLDIRECT 113.0 10/22/2017   TRIG 167.0 (H) 10/26/2018   CHOLHDL 4 10/26/2018   A/P: hopefully remains controlled  with LDL under 100- he will come back for labs in about a month. If #s above 100 for LDL could try twice a week.   #hypertension/overweight S: controlled on Irbesartan 300 mg daily in office. Mild poor control at home Checking BP at home occasionally. Runs between 140/90-150/90. Denies HA, dizziness, CP, SOB, visual changes.   Eats salt in moderation. Not structured exercise. Diet doesn't sound bad but weight up 6 lb lbs in a year- could contribute to higher BP BP Readings from Last 3 Encounters:  06/22/19 136/72  01/28/19 (!) 147/74  10/26/18 136/78  A/P: blood pressure controlled in office but want to get home #s controlled as well For overweight and hypertension- start walking treadmill and we discussed/counseled on improving diet.   # GERD  S:Taking Omeprazole 40 mg daily.  Managing sx well. Failed 20 mg effort in past A/P: discussed retrial 20mg  dose in future if patient is able to get back down to 200 or so   # Erectile Dysfunction S:Taking Sildenafil 100 mg prn.   A/P: reasonable control- continue current medicine   # meralgia paresthetica S:no issues as long as regularly takes gabapentin  A/P: patient taking gabapentin in AM- advised him to trial perhaps a day off here or there- possible symptoms have improved and he can come off   # AAA_ noted after visit patient now due for updated AAA screen- will order today To team "After visit I realized he needed updated aneurysm ultrasound- I ordered this after visit, if he doesn't hear in 2 weeks please let us know. "  Recommended follow up:  January follow up planned but labs in early October (will be over a month from b12 injection which he is receiving slightly early today) Future Appointments  Date Time Provider [place]  07/29/2019  9:00 AM [facility] NURSE [facility]  07/29/2019  9:30 AM [facility] LAB [facility]  09/02/2019  9:00 AM [facility] NURSE [facility]  09/30/2019  9:00 AM [facility] NURSE [facility]  10/28/2019  8:00 AM Marin Olp, MD [facility]    Lab/Order associations:   ICD-10-CM   1. Need for influenza vaccination  Z23 Flu Vaccine QUAD High Dose(Fluad)  2. Hyperlipidemia, unspecified hyperlipidemia type  E78.5 Comprehensive metabolic panel    LDL cholesterol, direct  3. B12 deficiency  E53.8 Vitamin B12    cyanocobalamin ((VITAMIN B-12)) injection 1,000 mcg  4. Essential hypertension  I10   5. Overweight  E66.3   6. Gastroesophageal reflux disease without esophagitis  K21.9   7. ERECTILE DYSFUNCTION  F52.8   8. Meralgia paresthetica, unspecified laterality  G57.10   9. Abdominal aortic aneurysm (AAA) without rupture (HCC)  I71.4 VAS [place] AAA DUPLEX    Meds ordered this encounter  Medications  . gabapentin (NEURONTIN) 100 MG capsule     Sig: Take 1 capsule (100 mg total) by mouth at bedtime.    Dispense:  90 capsule    Refill:  3  . irbesartan (AVAPRO) 300 MG tablet    Sig: TAKE 1 TABLET(300 MG) BY MOUTH DAILY    Dispense:  90 tablet    Refill:  3  . cyanocobalamin ((VITAMIN B-12)) injection 1,000 mcg    Return precautions advised.  Garret Reddish, MD

## 2019-06-22 NOTE — Patient Instructions (Addendum)
Health Maintenance Due  Topic Date Due  . INFLUENZA VACCINE - today 05/22/2019   Wt Readings from Last 3 Encounters:  06/22/19 233 lb 6.4 oz (105.9 kg)  10/26/18 229 lb 3.2 oz (104 kg)  07/09/18 227 lb (103 kg)  weight trending up- lets try myfitnesspal and getting back on that treadmill  Schedule a lab visit at the check out desk in about a month. Return for future fasting labs meaning nothing but water after midnight please. Ok to take your medications with water.   B12 injection today- also schedule another one when you come back for bloodwork next month

## 2019-06-23 NOTE — Progress Notes (Signed)
Called pt and advised.  

## 2019-06-24 ENCOUNTER — Other Ambulatory Visit: Payer: Self-pay

## 2019-06-24 ENCOUNTER — Ambulatory Visit (HOSPITAL_COMMUNITY)
Admission: RE | Admit: 2019-06-24 | Discharge: 2019-06-24 | Disposition: A | Discharge status: Home / Self Care | Payer: Medicare Other | Source: Ambulatory Visit | Attending: Cardiovascular Disease | Admitting: Cardiovascular Disease

## 2019-06-24 DIAGNOSIS — I714 Abdominal aortic aneurysm, without rupture, unspecified: Secondary | ICD-10-CM

## 2019-07-01 ENCOUNTER — Ambulatory Visit: Payer: Medicare Other

## 2019-07-04 ENCOUNTER — Other Ambulatory Visit: Payer: Self-pay | Admitting: Family Medicine

## 2019-07-07 ENCOUNTER — Telehealth: Payer: Self-pay | Admitting: Family Medicine

## 2019-07-07 DIAGNOSIS — Z77011 Contact with and (suspected) exposure to lead: Secondary | ICD-10-CM

## 2019-07-07 NOTE — Telephone Encounter (Signed)
Dr. Yong Channel, do you know anything about pt needing to have led test done? I see orders for B12, CMP, and LDL.

## 2019-07-07 NOTE — Telephone Encounter (Signed)
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 He will lose jobs if he isn't able to have this drawn ASAP. He said that he forgot to mention this to Dr. Yong Channel at his last visit. He needs order to be sent to [facility].

## 2019-07-07 NOTE — Addendum Note (Signed)
Addended by: Jasper Loser on: 07/07/2019 03:25 PM   Modules accepted: Orders

## 2019-07-07 NOTE — Telephone Encounter (Signed)
Called and spoke with patient. He is scheduled for regular labs here already. He says that he was told that he could go to [facility] in [place] to have his lead level checked for work.   Pleas let him know once order has been placed so that he can call back to scheduled his appointment.

## 2019-07-07 NOTE — Telephone Encounter (Signed)
See note  Copied from [place] [phone]. Topic: General - Other >> Jul 07, 2019  8:07 AM Leward Quan A wrote: Reason for CRM: [place] with [facility] called to say that patient came in for labs stating that he was to have some type of led test done but there were no orders. She is asking for a nurse to call back with some clarification please Ph# [phone]

## 2019-07-07 NOTE — Telephone Encounter (Signed)
From last visist avs "Schedule a lab visit at the check out desk in about a month. Return for future fasting labs meaning nothing but water after midnight please. Ok to take your medications with water. "  Labs were supposed to be here I thought

## 2019-07-07 NOTE — Telephone Encounter (Signed)
Who told him this? Why does he need lead levels drawn? Was this discussed with me previously?

## 2019-07-07 NOTE — Telephone Encounter (Signed)
Order entered and faxed to [facility] [address], [place], [place] [address] at [phone]

## 2019-07-07 NOTE — Telephone Encounter (Signed)
Called pt and advised.  

## 2019-07-07 NOTE — Telephone Encounter (Signed)
You may send lead level to [facility] under lead exposure

## 2019-07-08 ENCOUNTER — Other Ambulatory Visit: Payer: Self-pay | Admitting: Family Medicine

## 2019-07-08 DIAGNOSIS — Z77011 Contact with and (suspected) exposure to lead: Secondary | ICD-10-CM | POA: Diagnosis not present

## 2019-07-12 LAB — LEAD, BLOOD (ADULT >= 16 YRS): Lead: 4 ug/dL (ref <5)

## 2019-07-29 ENCOUNTER — Other Ambulatory Visit: Payer: Self-pay

## 2019-07-29 ENCOUNTER — Ambulatory Visit (INDEPENDENT_AMBULATORY_CARE_PROVIDER_SITE_OTHER): Payer: Medicare Other | Admitting: *Deleted

## 2019-07-29 ENCOUNTER — Other Ambulatory Visit (INDEPENDENT_AMBULATORY_CARE_PROVIDER_SITE_OTHER): Payer: Medicare Other

## 2019-07-29 ENCOUNTER — Encounter: Payer: Self-pay | Admitting: *Deleted

## 2019-07-29 DIAGNOSIS — E538 Deficiency of other specified B group vitamins: Secondary | ICD-10-CM | POA: Diagnosis not present

## 2019-07-29 DIAGNOSIS — Z77011 Contact with and (suspected) exposure to lead: Secondary | ICD-10-CM

## 2019-07-29 DIAGNOSIS — E785 Hyperlipidemia, unspecified: Secondary | ICD-10-CM | POA: Diagnosis not present

## 2019-07-29 LAB — COMPREHENSIVE METABOLIC PANEL
ALT: 58 U/L — ABNORMAL HIGH (ref 0–53)
AST: 33 U/L (ref 0–37)
Albumin: 4.2 g/dL (ref 3.5–5.2)
Alkaline Phosphatase: 35 U/L — ABNORMAL LOW (ref 39–117)
BUN: 16 mg/dL (ref 6–23)
CO2: 24 mEq/L (ref 19–32)
Calcium: 9 mg/dL (ref 8.4–10.5)
Chloride: 102 mEq/L (ref 96–112)
Creatinine, Ser: 0.97 mg/dL (ref 0.40–1.50)
GFR: 76.75 mL/min (ref >60.00)
Glucose, Bld: 98 mg/dL (ref 70–99)
Potassium: 4.4 mEq/L (ref 3.5–5.1)
Sodium: 135 mEq/L (ref 135–145)
Total Bilirubin: 1.1 mg/dL (ref 0.2–1.2)
Total Protein: 6.7 g/dL (ref 6.0–8.3)

## 2019-07-29 LAB — LDL CHOLESTEROL, DIRECT: Direct LDL: 135 mg/dL

## 2019-07-29 LAB — VITAMIN B12: Vitamin B-12: 273 pg/mL (ref 211–911)

## 2019-07-29 MED ORDER — CYANOCOBALAMIN 1000 MCG/ML IJ SOLN
1000.0000 ug | Freq: Once | INTRAMUSCULAR | Status: AC
  Administered 2019-07-29: 1000 ug via INTRAMUSCULAR

## 2019-07-29 NOTE — Progress Notes (Signed)
Per orders of Dr. Yong Channel, injection of Cyanocobalamin 1000 mcg given by Anselmo Pickler in Left deltoid. Patient tolerated injection well. Patient will make appointment for 1 month

## 2019-07-29 NOTE — Addendum Note (Signed)
Addended by: Francis Dowse T on: 07/29/2019 08:39 AM   Modules accepted: Orders

## 2019-07-29 NOTE — Progress Notes (Signed)
I have reviewed and agree with note, evaluation, plan.   Jamesha Ellsworth, MD  

## 2019-07-29 NOTE — Progress Notes (Signed)
Your cholesterol has really jumped up- do you think you could tolerate taking the rosuvastatin 5 mg twice a week?  If so our team can increase her dosage to 5 mg twice a week #26 with 3 refills.  Kidney and liver are largely normal.  1 of your liver function tests was a hair high  Vitamin B12 was in normal range-please continue to supplement as you are still low normal

## 2019-08-02 LAB — LEAD, BLOOD (ADULT >= 16 YRS): Lead: 3 ug/dL (ref <5)

## 2019-08-10 ENCOUNTER — Telehealth: Payer: Self-pay | Admitting: Family Medicine

## 2019-08-10 ENCOUNTER — Other Ambulatory Visit: Payer: Self-pay

## 2019-08-10 MED ORDER — ROSUVASTATIN CALCIUM 5 MG PO TABS: 5.0000 mg | ORAL_TABLET | ORAL | 1 refills | Status: DC

## 2019-08-10 NOTE — Telephone Encounter (Signed)
Medication refilled

## 2019-08-10 NOTE — Telephone Encounter (Signed)
Wife states Dr Yong Channel increased his rosuvastatin (CRESTOR) 5 MG tablet  To 2 times a week from once a week. Pt needs a new rx sent to  28 days  [facility], [place] [address] [phone] (Phone) [phone] (Fax)

## 2019-09-02 ENCOUNTER — Ambulatory Visit (INDEPENDENT_AMBULATORY_CARE_PROVIDER_SITE_OTHER): Payer: Medicare Other

## 2019-09-02 ENCOUNTER — Other Ambulatory Visit: Payer: Self-pay

## 2019-09-02 DIAGNOSIS — E538 Deficiency of other specified B group vitamins: Secondary | ICD-10-CM

## 2019-09-02 MED ORDER — CYANOCOBALAMIN 1000 MCG/ML IJ SOLN
1000.0000 ug | Freq: Once | INTRAMUSCULAR | Status: AC
  Administered 2019-09-02: 1000 ug via INTRAMUSCULAR

## 2019-09-08 NOTE — Progress Notes (Signed)
Per orders of Dr.Hunter , injection of b12 given by Sandford Craze. In rt deltoid Patient tolerated injection well.

## 2019-09-08 NOTE — Progress Notes (Signed)
I have reviewed and agree with note, evaluation, plan.   Terilyn Sano, MD  

## 2019-09-08 NOTE — Patient Instructions (Signed)
There are no preventive care reminders to display for this patient.  - Flu shot today - High dose flu shot today - declines for this season - will complete later in flu season (please let us know if you get this at another location so we can update your chart)

## 2019-09-29 ENCOUNTER — Other Ambulatory Visit: Payer: Self-pay

## 2019-09-30 ENCOUNTER — Ambulatory Visit (INDEPENDENT_AMBULATORY_CARE_PROVIDER_SITE_OTHER): Payer: Medicare Other

## 2019-09-30 DIAGNOSIS — E538 Deficiency of other specified B group vitamins: Secondary | ICD-10-CM

## 2019-09-30 MED ORDER — CYANOCOBALAMIN 1000 MCG/ML IJ SOLN
1000.0000 ug | Freq: Once | INTRAMUSCULAR | Status: AC
  Administered 2019-09-30: 1000 ug via INTRAMUSCULAR

## 2019-10-08 NOTE — Progress Notes (Signed)
Per orders of Dr. Yong Channel, injection of B12 given by Sandford Craze. Patient tolerated injection well.

## 2019-10-12 NOTE — Progress Notes (Signed)
I have reviewed and agree with note, evaluation, plan.   Domnick Chervenak, MD  

## 2019-10-27 ENCOUNTER — Other Ambulatory Visit: Payer: Self-pay

## 2019-10-28 ENCOUNTER — Other Ambulatory Visit: Payer: Self-pay

## 2019-10-28 ENCOUNTER — Ambulatory Visit (INDEPENDENT_AMBULATORY_CARE_PROVIDER_SITE_OTHER): Payer: Medicare Other | Admitting: Family Medicine

## 2019-10-28 ENCOUNTER — Encounter: Payer: Self-pay | Admitting: Family Medicine

## 2019-10-28 VITALS — BP 140/68 | HR 68 | Temp 98.8°F | Ht 69.8 in | Wt 235.8 lb

## 2019-10-28 DIAGNOSIS — K429 Umbilical hernia without obstruction or gangrene: Secondary | ICD-10-CM | POA: Diagnosis not present

## 2019-10-28 DIAGNOSIS — I714 Abdominal aortic aneurysm, without rupture, unspecified: Secondary | ICD-10-CM

## 2019-10-28 DIAGNOSIS — Z0000 Encounter for general adult medical examination without abnormal findings: Secondary | ICD-10-CM

## 2019-10-28 DIAGNOSIS — Z85828 Personal history of other malignant neoplasm of skin: Secondary | ICD-10-CM

## 2019-10-28 DIAGNOSIS — Z86010 Personal history of colonic polyps: Secondary | ICD-10-CM

## 2019-10-28 DIAGNOSIS — M720 Palmar fascial fibromatosis [Dupuytren]: Secondary | ICD-10-CM | POA: Insufficient documentation

## 2019-10-28 DIAGNOSIS — E538 Deficiency of other specified B group vitamins: Secondary | ICD-10-CM

## 2019-10-28 DIAGNOSIS — Z87891 Personal history of nicotine dependence: Secondary | ICD-10-CM | POA: Diagnosis not present

## 2019-10-28 DIAGNOSIS — Z125 Encounter for screening for malignant neoplasm of prostate: Secondary | ICD-10-CM

## 2019-10-28 DIAGNOSIS — K219 Gastro-esophageal reflux disease without esophagitis: Secondary | ICD-10-CM

## 2019-10-28 DIAGNOSIS — I10 Essential (primary) hypertension: Secondary | ICD-10-CM

## 2019-10-28 DIAGNOSIS — R739 Hyperglycemia, unspecified: Secondary | ICD-10-CM

## 2019-10-28 DIAGNOSIS — E785 Hyperlipidemia, unspecified: Secondary | ICD-10-CM

## 2019-10-28 DIAGNOSIS — R748 Abnormal levels of other serum enzymes: Secondary | ICD-10-CM

## 2019-10-28 LAB — LIPID PANEL
Cholesterol: 189 mg/dL (ref 0–200)
HDL: 43.1 mg/dL (ref >39.00)
LDL Cholesterol: 114 mg/dL — ABNORMAL HIGH (ref 0–99)
NonHDL: 145.65
Total CHOL/HDL Ratio: 4
Triglycerides: 159 mg/dL — ABNORMAL HIGH (ref 0.0–149.0)
VLDL: 31.8 mg/dL (ref 0.0–40.0)

## 2019-10-28 LAB — COMPREHENSIVE METABOLIC PANEL
ALT: 38 U/L (ref 0–53)
AST: 24 U/L (ref 0–37)
Albumin: 4.2 g/dL (ref 3.5–5.2)
Alkaline Phosphatase: 45 U/L (ref 39–117)
BUN: 23 mg/dL (ref 6–23)
CO2: 24 mEq/L (ref 19–32)
Calcium: 9.1 mg/dL (ref 8.4–10.5)
Chloride: 102 mEq/L (ref 96–112)
Creatinine, Ser: 1.23 mg/dL (ref 0.40–1.50)
GFR: 58.31 mL/min — ABNORMAL LOW (ref >60.00)
Glucose, Bld: 104 mg/dL — ABNORMAL HIGH (ref 70–99)
Potassium: 4.3 mEq/L (ref 3.5–5.1)
Sodium: 137 mEq/L (ref 135–145)
Total Bilirubin: 0.9 mg/dL (ref 0.2–1.2)
Total Protein: 7 g/dL (ref 6.0–8.3)

## 2019-10-28 LAB — CBC WITH DIFFERENTIAL/PLATELET
Basophils Absolute: 0.1 10*3/uL (ref 0.0–0.1)
Basophils Relative: 0.9 % (ref 0.0–3.0)
Eosinophils Absolute: 0.4 10*3/uL (ref 0.0–0.7)
Eosinophils Relative: 6.2 % — ABNORMAL HIGH (ref 0.0–5.0)
HCT: 43.7 % (ref 39.0–52.0)
Hemoglobin: 15.1 g/dL (ref 13.0–17.0)
Lymphocytes Relative: 35.7 % (ref 12.0–46.0)
Lymphs Abs: 2.5 10*3/uL (ref 0.7–4.0)
MCHC: 34.5 g/dL (ref 30.0–36.0)
MCV: 87.1 fl (ref 78.0–100.0)
Monocytes Absolute: 0.6 10*3/uL (ref 0.1–1.0)
Monocytes Relative: 9.4 % (ref 3.0–12.0)
Neutro Abs: 3.3 10*3/uL (ref 1.4–7.7)
Neutrophils Relative %: 47.8 % (ref 43.0–77.0)
Platelets: 204 10*3/uL (ref 150.0–400.0)
RBC: 5.01 Mil/uL (ref 4.22–5.81)
RDW: 13 % (ref 11.5–15.5)
WBC: 6.9 10*3/uL (ref 4.0–10.5)

## 2019-10-28 LAB — VITAMIN B12: Vitamin B-12: 262 pg/mL (ref 211–911)

## 2019-10-28 LAB — PSA: PSA: 0.94 ng/mL (ref 0.10–4.00)

## 2019-10-28 MED ORDER — CYANOCOBALAMIN 1000 MCG/ML IJ SOLN
1000.0000 ug | Freq: Once | INTRAMUSCULAR | Status: AC
  Administered 2019-10-28: 1000 ug via INTRAMUSCULAR

## 2019-10-28 MED ORDER — IRBESARTAN-HYDROCHLOROTHIAZIDE 300-12.5 MG PO TABS: 1.0000 | ORAL_TABLET | Freq: Every day | ORAL | 3 refills | Status: DC

## 2019-10-28 MED ORDER — B-12 1000 MCG PO CAPS: 1000.0000 ug | ORAL_CAPSULE | Freq: Every day | ORAL | 3 refills | Status: DC

## 2019-10-28 MED ORDER — GABAPENTIN 100 MG PO CAPS: 100.0000 mg | ORAL_CAPSULE | Freq: Every day | ORAL | 3 refills | Status: DC

## 2019-10-28 NOTE — Patient Instructions (Addendum)
Please check with your pharmacy to see if they have the shingrix vaccine. If they do- please get this immunization and update [place] by phone call or mychart with dates you receive the vaccine  b12 injection today and then can schedule monthly after today. If you would like could purchase oral b12/cyanocobalamin 1000 mcg and take daily instead of monthly injections and then repeat b12 next visit - if you choose oral b12 route- start 1 month after today  Blood pressure slightly high. Stop irbesartan. Start irbesartan hctz 300-12.5 mg. Follow up in 1-2 months for recheck.   Please stop by lab before you go If you do not have mychart- we will call you about results within 5 business days of [place] receiving them.  If you have mychart- we will send your results within 3 business days of [place] receiving them.  If abnormal or we want to clarify a result, we will call or mychart you to make sure you receive the message.  If you have questions or concerns or don't hear within 5-7 days, please send [place] a message or call us.    Recommended follow up: Return in about 1 month (around 11/28/2019) for follow up for blood pressure- or sooner if needed.

## 2019-10-28 NOTE — Progress Notes (Signed)
Phone [phone] In person visit- patient seen for CPE but had acute concerns/changes in chronic care needed   Subjective:   Larry Williamson is a 69 y.o. year old very pleasant male patient who presents for/with See problem oriented charting  ROS-no chest pain or shortness of breath with exertion reported.  This visit occurred during the SARS-CoV-2 public health emergency.  Safety protocols were in place, including screening questions prior to the visit, additional usage of staff PPE, and extensive cleaning of exam room while observing appropriate contact time as indicated for disinfecting solutions.   Past Medical History-  Patient Active Problem List   Diagnosis Date Noted  . AAA (abdominal aortic aneurysm) ([place]) 01/18/2016    Priority: High  . Meralgia paresthetica 12/28/2014    Priority: Medium  . Hyperlipidemia 05/15/2007    Priority: Medium  . Essential hypertension 05/15/2007    Priority: Medium  . Dupuytren's contracture of right hand 10/28/2019    Priority: Low  . Hyperglycemia 10/01/2016    Priority: Low  . History of skin cancer 12/28/2014    Priority: Low  . Former smoker 12/28/2014    Priority: Low  . Abnormal liver enzymes 12/29/2010    Priority: Low  . Allergic rhinitis 06/17/2008    Priority: Low  . ERECTILE DYSFUNCTION 12/07/2007    Priority: Low  . History of colonic polyps 12/07/2007    Priority: Low  . Umbilical hernia [id number]    Priority: Low  . GERD 05/15/2007    Priority: Low    Medications- reviewed and updated Current Outpatient Medications  Medication Sig Dispense Refill  . aspirin 81 MG tablet Take 81 mg by mouth daily.      [name] gabapentin (NEURONTIN) 100 MG capsule Take 1-2 capsules (100-200 mg total) by mouth daily. 120 capsule 3  . irbesartan (AVAPRO) 300 MG tablet TAKE 1 TABLET(300 MG) BY MOUTH DAILY 90 tablet 3  . MULTIPLE VITAMIN PO Take by mouth daily.      . Omega-3 Fatty Acids (FISH OIL) 1000 MG CAPS Take 2 capsules by  mouth 2 (two) times daily.      [name] omeprazole (PRILOSEC) 40 MG capsule TAKE 1 CAPSULE BY MOUTH EVERY DAY 90 capsule 1  . rosuvastatin (CRESTOR) 5 MG tablet Take 1 tablet (5 mg total) by mouth once a week. 90 tablet 1  . sildenafil (VIAGRA) 100 MG tablet Take 1 tablet (100 mg total) by mouth daily as needed for erectile dysfunction. 10 tablet 5  . tizanidine (ZANAFLEX) 2 MG capsule Take 1 capsule (2 mg total) by mouth 2 (two) times daily as needed for muscle spasms. 40 capsule 2  . Cyanocobalamin (B-12) 1000 MCG CAPS Take 1,000 mcg by mouth daily. 90 capsule 3  . irbesartan-hydrochlorothiazide (AVALIDE) 300-12.5 MG tablet Take 1 tablet by mouth daily. 90 tablet 3   Current Facility-Administered Medications  Medication Dose Route Frequency Provider Last Rate Last Admin  . cyanocobalamin ((VITAMIN B-12)) injection 1,000 mcg  1,000 mcg Intramuscular Once Marin Olp, MD         Objective:  BP 140/68   Pulse 68   Temp 98.8 F (37.1 C) (Temporal)   Ht 5' 9.8" (1.773 m)   Wt 235 lb 12.8 oz (107 kg)   SpO2 96%   BMI 34.03 kg/m  Gen: NAD, resting comfortably Abdomen: Umbilical hernia noted MSK: Dupuytren's contracture noted right palm-without significant contracture of third or fourth finger-able to fully extend  EKG: sinus rhythm with rate 70, normal axis,  normal intervals, no hypertrophy, no st or t wave chages     Assessment and Plan   Hyperlipidemia-compliant with pravastatin 40 mg in the past but had some upper abdominal cramping which resolved off medication-we have started rosuvastatin 5 mg once a week now twice a week-also takes omega-3  supplements.  Mild LFT elevations in the past-cautious about increasing dose.  Last spring patient had some upper abdominal cramping and we tried off the statin Lab Results  Component Value Date   CHOL 167 10/26/2018   HDL 43.40 10/26/2018   LDLCALC 90 10/26/2018   LDLDIRECT 135.0 07/29/2019   TRIG 167.0 (H) 10/26/2018   CHOLHDL 4  10/26/2018  Hopefully well controlled on rosuvastatin 5 mg twice a week-if not and LFTs okay may titrate to 3 times a week  Essential hypertension-compliant with irbesartan 300 mg. Poor control at home in 140s . We will change to irbesartan hydrochlorothiazide 300-12.5 mg and follow-up in 1 to 2 months - last EKG 10 years ago will update today especially with potential upcoming surgery  Abdominal aortic aneurysm (AAA) without rupture (HCC)-had improved on 2018 exam.  September 2020 exam was unchanged-opted for 2-year repeat-September 2022 or later.  Need tighter control of blood pressure-see above  B12 deficiency-likely related to long-term PPI use.  Patient is currently on injections a month ago- wants to go ahead and get today.  From avs "b12 injection today and then can schedule monthly after today. If you would like could purchase oral b12/cyanocobalamin 1000 mcg and take daily instead of monthly injections and then repeat b12 next visit"  Meralgia paresthetica bilateral-continues on gabapentin 100 mg each morning- occasionally takes a .  Worse when on concrete floors.  We have discussed last visit trialing off a few days to see how he does- he was not able to do that actually has to take 2nd dose some days-we updated prescription to allow second dose  Muscle spasms-intermittent muscle spasms in trunk area-tizanidine has been helpful in the past. Very sparing  Umbilical hernia present about 20 years- over 2 weeks has been having more pain around that and right below it. He would like a referral to the surgeon to at least disuss options. Able to complete 4 mets without chest pain or shortness of breath. No further cardiac workup required.   Dupuytren contracture on right hand- still able to extend fingers fully. Discussed hand surgery if worsens. Mother had this.  He wants to monitor for now  Recommended follow up: Return in about 1 month (around 11/28/2019) for follow up for blood pressure- or  sooner if needed. Future Appointments  Date Time Provider [place]  12/03/2019  8:20 AM Marin Olp, MD [facility]    Lab/Order associations:   ICD-10-CM   1. Hyperlipidemia, unspecified hyperlipidemia type  E78.5 CBC with Differential/Platelet    Comprehensive metabolic panel    Lipid panel  2. Essential hypertension  I10 EKG 12-Lead  3. Umbilical hernia without obstruction and without gangrene  K42.9 Surgery  4. Abdominal aortic aneurysm (AAA) without rupture (HCC)  I71.4   5. B12 deficiency  E53.8 Vitamin B12    cyanocobalamin ((VITAMIN B-12)) injection 1,000 mcg    Meds ordered this encounter  Medications  . Cyanocobalamin (B-12) 1000 MCG CAPS    Sig: Take 1,000 mcg by mouth daily.    Dispense:  90 capsule    Refill:  3  . irbesartan-hydrochlorothiazide (AVALIDE) 300-12.5 MG tablet    Sig: Take 1 tablet by  mouth daily.    Dispense:  90 tablet    Refill:  3  . gabapentin (NEURONTIN) 100 MG capsule    Sig: Take 1-2 capsules (100-200 mg total) by mouth daily.    Dispense:  120 capsule    Refill:  3  . cyanocobalamin ((VITAMIN B-12)) injection 1,000 mcg   Return precautions advised.  Garret Reddish, MD

## 2019-10-28 NOTE — Assessment & Plan Note (Signed)
Dupuytren contracture on right hand- still able to extend fingers fully. Discussed hand surgery if worsens. Mother hand this.

## 2019-10-28 NOTE — Progress Notes (Signed)
Phone: [phone]   Subjective:  Patient presents today for their annual physical. Chief complaint-noted.   See problem oriented charting- ROS- full  review of systems was completed and negative  except for: pain from umbilical hernia and meralgia paresthetica  The following were reviewed and entered/updated in epic: Past Medical History:  Diagnosis Date  . Adenomatous colon polyp 06/2006  . ALLERGIC RHINITIS   . Allergy   . Anemia   . ANEMIA DUE TO DIETARY IRON DEFICIENCY 05/28/2007   Due to giving regularly giving blood. Resolved with cutting in half.     . COMPRESSION FRACTURE, THORACIC VERTEBRA 03/09/2008   2009, no chronic pain   . Diverticulitis of colon   . DIVERTICULOSIS, COLON 05/28/2007   Qualifier: Diagnosis of  By: Arnoldo Morale MD, Balinda Quails   . GERD (gastroesophageal reflux disease)   . Hyperlipidemia   . Hypertension   . Ulcer 1992   gastric   Patient Active Problem List   Diagnosis Date Noted  . AAA (abdominal aortic aneurysm) ([place]) 01/18/2016    Priority: High  . Meralgia paresthetica 12/28/2014    Priority: Medium  . Hyperlipidemia 05/15/2007    Priority: Medium  . Essential hypertension 05/15/2007    Priority: Medium  . Dupuytren's contracture of right hand 10/28/2019    Priority: Low  . Hyperglycemia 10/01/2016    Priority: Low  . History of skin cancer 12/28/2014    Priority: Low  . Former smoker 12/28/2014    Priority: Low  . Abnormal liver enzymes 12/29/2010    Priority: Low  . Allergic rhinitis 06/17/2008    Priority: Low  . ERECTILE DYSFUNCTION 12/07/2007    Priority: Low  . History of colonic polyps 12/07/2007    Priority: Low  . Umbilical hernia [id number]    Priority: Low  . GERD 05/15/2007    Priority: Low   Past Surgical History:  Procedure Laterality Date  . COLONOSCOPY  01/2017   hx polyps/tics/hems  . COLONOSCOPY W/ POLYPECTOMY    . FOOT SURGERY Right    pain scraper several inches into foot  . POLYPECTOMY      Family History   Problem Relation Age of Onset  . Cancer Father        lung, former smoker, [place] lung in [place]  . Dementia Mother   . Hemochromatosis Brother   . Colon cancer Neg Hx   . Esophageal cancer Neg Hx   . Rectal cancer Neg Hx   . Stomach cancer Neg Hx   . Colon polyps Neg Hx     Medications- reviewed and updated Current Outpatient Medications  Medication Sig Dispense Refill  . aspirin 81 MG tablet Take 81 mg by mouth daily.      [name] gabapentin (NEURONTIN) 100 MG capsule Take 1-2 capsules (100-200 mg total) by mouth daily. 120 capsule 3  . irbesartan (AVAPRO) 300 MG tablet TAKE 1 TABLET(300 MG) BY MOUTH DAILY 90 tablet 3  . MULTIPLE VITAMIN PO Take by mouth daily.      . Omega-3 Fatty Acids (FISH OIL) 1000 MG CAPS Take 2 capsules by mouth 2 (two) times daily.      [name] omeprazole (PRILOSEC) 40 MG capsule TAKE 1 CAPSULE BY MOUTH EVERY DAY 90 capsule 1  . rosuvastatin (CRESTOR) 5 MG tablet Take 1 tablet (5 mg total) by mouth once a week. 90 tablet 1  . sildenafil (VIAGRA) 100 MG tablet Take 1 tablet (100 mg total) by mouth daily as needed for erectile dysfunction. 10  tablet 5  . tizanidine (ZANAFLEX) 2 MG capsule Take 1 capsule (2 mg total) by mouth 2 (two) times daily as needed for muscle spasms. 40 capsule 2  . Cyanocobalamin (B-12) 1000 MCG CAPS Take 1,000 mcg by mouth daily. 90 capsule 3  . irbesartan-hydrochlorothiazide (AVALIDE) 300-12.5 MG tablet Take 1 tablet by mouth daily. 90 tablet 3   No current facility-administered medications for this visit.    Allergies-reviewed and updated No Known Allergies  Social History   Social History Narrative   Married (wife pt of Dr. Maudie Mercury). 5 children (2 by first wife, 3 stepkids), 11 granddaughters + 2 greatgrandsons.       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 TM normal after curette. Bridge of nose normal. Eyelids normal.  Neck: no thyromegaly or cervical lymphadenopathy  CV: RRR no murmurs rubs or gallops Lungs: CTAB no crackles, wheeze, rhonchi Abdomen: soft/nontender/nondistended/normal bowel sounds. No rebound or guarding.  Ext: no edema Skin: warm, dry Neuro: grossly normal, moves all extremities, PERRLA    Assessment and Plan  69 y.o. male presenting for annual physical.  Health Maintenance counseling: 1. Anticipatory guidance: Patient counseled regarding regular dental exams -q6 months, eye exams -every other yearly,  avoiding smoking and second hand smoke , limiting alcohol to 2 beverages per day -generally once a day bloody mary with v8.   2. Risk factor reduction:  Advised patient of need for regular exercise and diet rich and fruits and vegetables to reduce risk of heart attack and stroke. Exercise-active with work but have encouraged him to be more active outside of work and target 150 minutes of exercise a week- avoid heavy lifting with hernia. Diet-patient was 229 last physical-today up 6 lbs- tough year with covid- feels like low on sweets- does not feel like he eats that bad or overeats so feels like has to exercise.  Wt Readings from Last 3 Encounters:  10/28/19 235 lb 12.8 oz (107 kg)  06/22/19 233 lb 6.4 oz (105.9 kg)  10/26/18 229 lb 3.2 oz (104 kg)  3. Immunizations/screenings/ancillary studies-discussed Shingrix.  He is interested in COVID-19 vaccine  Immunization History  Administered Date(s) Administered  . Fluad Quad(high Dose 65+) 06/22/2019  . Influenza Split 07/30/2011, 07/10/2012  . Influenza Whole 08/04/2007, 07/21/2008, 07/18/2009, 07/16/2010  . Influenza, High Dose Seasonal PF 07/16/2016, 07/17/2017, 07/09/2018  . Influenza,inj,Quad PF,6+ Mos  07/05/2013, 08/03/2014, 07/13/2015  . Pneumococcal Conjugate-13 01/09/2016  . Pneumococcal Polysaccharide-23 04/01/2017  . Td 10/22/1999, 12/11/2009   4. Prostate cancer screening- low risk prior PSA trend, update PSA today.  Defer rectal as long as PSA trend reassuring Lab Results  Component Value Date   PSA 0.90 10/26/2018   PSA 0.99 01/21/2018   PSA 1.74 10/22/2017   5. Colon cancer screening - history of colon polyps.  October 27, 2017 with 3-year follow-up due to adenomatous polyps. 6. Skin cancer screening-history of skin cancer-follows with dermatology Dr. Denna Haggard every 18 months.Marland Kitchenadvised regular sunscreen use. Denies worrisome, changing, or new skin lesions.  7.  Former smoker-quit  1980.  Being followed for AAA.  Does not qualify for lung cancer screening  Status of chronic or acute concerns   Erectile dysfunction-viagra with some help.   Hyperglycemia-blood sugars elevated in the past but A1c not elevated on last check.  Check fasting blood sugar along this morning  Lab Results  Component Value Date   HGBA1C 5.1 10/26/2018   Gastroesophageal reflux disease-remains on omeprazole 40 mg.  20 mg trial of omeprazole was ineffective in the past.   See separate problem-oriented note for areas with changes or potential changes  Recommended follow up: Return in about 1 month (around 11/28/2019) for follow up for blood pressure- or sooner if needed, follow up- or sooner if needed.  Lab/Order associations: fasting   ICD-10-CM   1. Preventative health care  Z00.00   7. Gastroesophageal reflux disease without esophagitis  K21.9   8. Screening for prostate cancer  Z12.5 PSA  9. Dupuytren's contracture of right hand  M72.0   10. Hyperglycemia  R73.9   11. Former smoker  Z87.891   71. History of skin cancer  Z85.828   13. Abnormal liver enzymes  R74.8   14. History of colonic polyps  Z86.010    Return precautions advised.  Garret Reddish, MD

## 2019-10-29 ENCOUNTER — Other Ambulatory Visit: Payer: Self-pay

## 2019-10-29 MED ORDER — ROSUVASTATIN CALCIUM 5 MG PO TABS: ORAL_TABLET | ORAL | 5 refills | Status: DC

## 2019-12-02 ENCOUNTER — Other Ambulatory Visit: Payer: Self-pay

## 2019-12-02 NOTE — Progress Notes (Signed)
Phone [phone] In person visit   Subjective:   Larry Williamson is a 69 y.o. year old very pleasant male patient who presents for/with See problem oriented charting Chief Complaint  Patient presents with  . Hypertension   This visit occurred during the SARS-CoV-2 public health emergency.  Safety protocols were in place, including screening questions prior to the visit, additional usage of staff PPE, and extensive cleaning of exam room while observing appropriate contact time as indicated for disinfecting solutions.   Past Medical History-  Patient Active Problem List   Diagnosis Date Noted  . AAA (abdominal aortic aneurysm) ([place]) 01/18/2016    Priority: High  . B12 deficiency 12/03/2019    Priority: Medium  . Meralgia paresthetica 12/28/2014    Priority: Medium  . Hyperlipidemia 05/15/2007    Priority: Medium  . Essential hypertension 05/15/2007    Priority: Medium  . Dupuytren's contracture of right hand 10/28/2019    Priority: Low  . Hyperglycemia 10/01/2016    Priority: Low  . History of skin cancer 12/28/2014    Priority: Low  . Former smoker 12/28/2014    Priority: Low  . Abnormal liver enzymes 12/29/2010    Priority: Low  . Allergic rhinitis 06/17/2008    Priority: Low  . ERECTILE DYSFUNCTION 12/07/2007    Priority: Low  . History of colonic polyps 12/07/2007    Priority: Low  . Umbilical hernia [id number]    Priority: Low  . GERD 05/15/2007    Priority: Low    Medications- reviewed and updated Current Outpatient Medications  Medication Sig Dispense Refill  . aspirin 81 MG tablet Take 81 mg by mouth daily.      [name] gabapentin (NEURONTIN) 100 MG capsule Take 1-2 capsules (100-200 mg total) by mouth daily. 120 capsule 3  . irbesartan-hydrochlorothiazide (AVALIDE) 300-12.5 MG tablet Take 1 tablet by mouth daily. 90 tablet 3  . MULTIPLE VITAMIN PO Take by mouth daily.      . Omega-3 Fatty Acids (FISH OIL) 1000 MG CAPS Take 2 capsules by mouth 2 (two)  times daily.      [name] omeprazole (PRILOSEC) 40 MG capsule TAKE 1 CAPSULE BY MOUTH EVERY DAY 90 capsule 1  . rosuvastatin (CRESTOR) 5 MG tablet Take 1 tablet three times a week 12 tablet 5  . sildenafil (VIAGRA) 100 MG tablet Take 1 tablet (100 mg total) by mouth daily as needed for erectile dysfunction. 10 tablet 5  . tizanidine (ZANAFLEX) 2 MG capsule Take 1 capsule (2 mg total) by mouth 2 (two) times daily as needed for muscle spasms. 40 capsule 2  . amLODipine (NORVASC) 2.5 MG tablet Take 1 tablet (2.5 mg total) by mouth daily. 90 tablet 3  . Cyanocobalamin (B-12) 1000 MCG CAPS Take 1,000 mcg by mouth daily. (Patient not taking: Reported on 12/03/2019) 90 capsule 3   Current Facility-Administered Medications  Medication Dose Route Frequency Provider Last Rate Last Admin  . cyanocobalamin ((VITAMIN B-12)) injection 1,000 mcg  1,000 mcg Intramuscular Once Marin Olp, MD         Objective:  BP (!) 144/78   Pulse (!) 51   Temp 98.6 F (37 C) (Temporal)   Ht 5\' 9"  (1.753 m)   Wt 238 lb (108 kg)   SpO2 95%   BMI 35.15 kg/m  Gen: NAD, resting comfortably CV: RRR no murmurs rubs or gallops Lungs: CTAB no crackles, wheeze, rhonchi Ext: no edema Skin: warm, dry     Assessment and Plan  #  Hypertension #Aortic aneurysm S:Patient changed to irbesartan hydrochlorothiazide 300-12.5 at appointment on 10/28/2019 due to poor control on irbesartan 300mg . Has been checking his blood pressure at home and average is in the 140's/to mid 70's. Patient denies any chest pain, shortness of breath, changes or changes in vision. Does not add salt to food. Tries to maintain a heart healthy diet.  BP Readings from Last 3 Encounters:  12/03/19 (!) 144/78  10/28/19 140/68  06/22/19 136/72  A/P: Home blood pressures remain poorly controlled in the 140s.  On my repeat today was also elevated at 123456 systolic.  We will add amlodipine 2.5 mg to current regimen-he will take this at night time.  He will continue  irbesartan hydrochlorothiazide.  Follow-up in 1 month-also will get B12 injection at that time  Of note aortic aneurysm was improved when checked in September 2020-has actually been noted as normal the last 2 checks-with plan to repeat in 2022-with that being said definitely want to keep systolic blood pressure under 140 to reduce risk of progression  # Umbilical hernia S:Referral was placed for [place] surgery at last visit. Never got call to make appointment. He is no longer having issues with it would like to hold off on appointment at this time.  A/P: I apologized to patient today that he was not called previously by his referral-offered to refer again today when he wants to hold off on appointment as above.  He reflects back and was having some more frequent bowel movements at that time and wonders if it was more about his GI tract than the actual umbilical hernia-he will let us know if he wants referral in the future but for now wants to hold off due to COVID-19  # B 12 Deficiency S: Last injection was given in office at last office visit. He was going to start over the counter b12 and have labs checked today. He has not started and would like to get another injection in office today.  A/P: B12 injection today before he leaves-he is still considering picking up pills but at least for the next 2 visits wants to do injections since he will be here anyway  # Hyperlipidemia  S:Had tried to increase Crestor to three times a week but started having muscle cramps. Has only taken once this week.   A/P: Encouraged patient to try twice a week again since he tolerated that after 2 weeks of taking once a week-fortunately myalgias resolved completely on once week dosing  # scheduled 2nd vaccine for covid already- had first on January 31st  Recommended follow up: Return in about 1 month (around 12/31/2019) for HTN and b 12 in ofice .  Lab/Order associations:   ICD-10-CM   1. Essential  hypertension  I10   2. Hyperlipidemia, unspecified hyperlipidemia type  E78.5   3. Abdominal aortic aneurysm (AAA) without rupture ([facility])  I71.4   4. Vitamin B12 deficiency  E53.8 cyanocobalamin ((VITAMIN B-12)) injection 1,000 mcg  5. Umbilical hernia without obstruction and without gangrene  K42.9     Meds ordered this encounter  Medications  . amLODipine (NORVASC) 2.5 MG tablet    Sig: Take 1 tablet (2.5 mg total) by mouth daily.    Dispense:  90 tablet    Refill:  3  . cyanocobalamin ((VITAMIN B-12)) injection 1,000 mcg    Return precautions advised.  Garret Reddish, MD

## 2019-12-03 ENCOUNTER — Ambulatory Visit (INDEPENDENT_AMBULATORY_CARE_PROVIDER_SITE_OTHER): Payer: Medicare Other | Admitting: Family Medicine

## 2019-12-03 ENCOUNTER — Encounter: Payer: Self-pay | Admitting: Family Medicine

## 2019-12-03 VITALS — BP 144/78 | HR 51 | Temp 98.6°F | Ht 69.0 in | Wt 238.0 lb

## 2019-12-03 DIAGNOSIS — E538 Deficiency of other specified B group vitamins: Secondary | ICD-10-CM | POA: Diagnosis not present

## 2019-12-03 DIAGNOSIS — E785 Hyperlipidemia, unspecified: Secondary | ICD-10-CM

## 2019-12-03 DIAGNOSIS — K429 Umbilical hernia without obstruction or gangrene: Secondary | ICD-10-CM | POA: Diagnosis not present

## 2019-12-03 DIAGNOSIS — I714 Abdominal aortic aneurysm, without rupture, unspecified: Secondary | ICD-10-CM

## 2019-12-03 DIAGNOSIS — I10 Essential (primary) hypertension: Secondary | ICD-10-CM

## 2019-12-03 MED ORDER — CYANOCOBALAMIN 1000 MCG/ML IJ SOLN
1000.0000 ug | Freq: Once | INTRAMUSCULAR | Status: AC
  Administered 2019-12-03: 11:00:00 1000 ug via INTRAMUSCULAR

## 2019-12-03 MED ORDER — AMLODIPINE BESYLATE 2.5 MG PO TABS: 2.5000 mg | ORAL_TABLET | Freq: Every day | ORAL | 3 refills | Status: DC

## 2019-12-03 NOTE — Assessment & Plan Note (Signed)
I apologized to patient today that he was not called previously by his referral-offered to refer again today when he wants to hold off on appointment as above.  He reflects back and was having some more frequent bowel movements at that time and wonders if it was more about his GI tract than the actual umbilical hernia-he will let us know if he wants referral in the future but for now wants to hold off due to COVID-19

## 2019-12-03 NOTE — Assessment & Plan Note (Signed)
Of note aortic aneurysm was improved when checked in September 2020-has actually been noted as normal the last 2 checks-with plan to repeat in 2022-with that being said definitely want to keep systolic blood pressure under 140 to reduce risk of progression

## 2019-12-03 NOTE — Assessment & Plan Note (Signed)
Home blood pressures remain poorly controlled in the 140s.  On my repeat today was also elevated at 123456 systolic.  We will add amlodipine 2.5 mg to current regimen-he will take this at night time.  He will continue irbesartan hydrochlorothiazide.  Follow-up in 1 month-also will get B12 injection at that time

## 2019-12-03 NOTE — Patient Instructions (Addendum)
b12 shot before you go  Why dont we just see each other back in a month- to recheck blood pressure and give b12 shot  Start amlodipine 2.5 mg before bed. Continue irbesartan hctz in the morning.   Lets do just one crestor this week and next- then can try two a week again   Recommended follow up: Return in about 1 month (around 12/31/2019) for follow up- or sooner if needed.

## 2019-12-08 ENCOUNTER — Other Ambulatory Visit: Payer: Self-pay | Admitting: Family Medicine

## 2020-01-03 ENCOUNTER — Encounter: Payer: Self-pay | Admitting: Family Medicine

## 2020-01-03 ENCOUNTER — Ambulatory Visit (INDEPENDENT_AMBULATORY_CARE_PROVIDER_SITE_OTHER): Payer: Medicare Other

## 2020-01-03 ENCOUNTER — Ambulatory Visit (INDEPENDENT_AMBULATORY_CARE_PROVIDER_SITE_OTHER): Payer: Medicare Other | Admitting: Family Medicine

## 2020-01-03 ENCOUNTER — Other Ambulatory Visit: Payer: Self-pay

## 2020-01-03 VITALS — BP 132/78 | HR 67 | Temp 97.7°F | Ht 69.0 in | Wt 238.8 lb

## 2020-01-03 DIAGNOSIS — E538 Deficiency of other specified B group vitamins: Secondary | ICD-10-CM

## 2020-01-03 DIAGNOSIS — K429 Umbilical hernia without obstruction or gangrene: Secondary | ICD-10-CM

## 2020-01-03 DIAGNOSIS — E785 Hyperlipidemia, unspecified: Secondary | ICD-10-CM

## 2020-01-03 DIAGNOSIS — Z0000 Encounter for general adult medical examination without abnormal findings: Secondary | ICD-10-CM | POA: Diagnosis not present

## 2020-01-03 DIAGNOSIS — I10 Essential (primary) hypertension: Secondary | ICD-10-CM | POA: Diagnosis not present

## 2020-01-03 MED ORDER — CYANOCOBALAMIN 1000 MCG/ML IJ SOLN
1000.0000 ug | Freq: Once | INTRAMUSCULAR | Status: AC
  Administered 2020-01-03: 1000 ug via INTRAMUSCULAR

## 2020-01-03 NOTE — Patient Instructions (Addendum)
Health Maintenance Due  Topic Date Due  . TETANUS/TDAP Will need to get at pharmacy  12/12/2019   B12 injection today and can schedule for next month or several months.   Nutritionfacts.org has good information on high cholesterol- just search under this topic  blood pressure is well controlled in office with addition of amlodipine.  We will continue this as well as irbesartan hctz 300-12.5 mg. We will follow up in 6 months. He will bring home cuffs to next visit.   Recommended follow up: Return in about 6 months (around 07/05/2020) for follow up- or sooner if needed. particularly if blood pressure goes up more

## 2020-01-03 NOTE — Progress Notes (Signed)
Subjective:   Larry Williamson is a 69 y.o. male who presents for Medicare Annual/Subsequent preventive examination.  Review of Systems:   Cardiac Risk Factors include: advanced age (>49men, >21 women);male gender;hypertension    Objective:    Vitals: BP 132/78   Pulse 67   Temp 97.7 F (36.5 C) (Temporal)   Ht 5\' 9"  (1.753 m)   Wt 238 lb 12.1 oz (108.3 kg)   SpO2 96%   BMI 35.26 kg/m   Body mass index is 35.26 kg/m.  Advanced Directives 01/03/2020 07/09/2018 10/27/2017 02/04/2017 01/21/2017  Does Patient Have a Medical Advance Directive? Yes Yes Yes Yes Yes  Type of Advance Directive Living will;Healthcare Power of [place];Living will [place];Living will  Does patient want to make changes to medical advance directive? No - Patient declined - - - -  Copy of [place] in Chart? No - copy requested - - - -    Tobacco Social History   Tobacco Use  Smoking Status Former Smoker  . Packs/day: 1.00  . Years: 15.00  . Pack years: 15.00  . Types: Cigarettes  . Quit date: 10/21/1978  . Years since quitting: 41.2  Smokeless Tobacco Never Used     Counseling given: Not Answered   Clinical Intake:  Pre-visit preparation completed: Yes  Pain : No/denies pain  Diabetes: No  How often do you need to have someone help you when you read instructions, pamphlets, or other written materials from your doctor or pharmacy?: 1 - Never  Interpreter Needed?: No  Information entered by :: Denman George LPN  Past Medical History:  Diagnosis Date  . Adenomatous colon polyp 06/2006  . ALLERGIC RHINITIS   . Allergy   . Anemia   . ANEMIA DUE TO DIETARY IRON DEFICIENCY 05/28/2007   Due to giving regularly giving blood. Resolved with cutting in half.     . COMPRESSION FRACTURE, THORACIC VERTEBRA 03/09/2008   2009, no chronic pain   . Diverticulitis of colon   . DIVERTICULOSIS, COLON  05/28/2007   Qualifier: Diagnosis of  By: Arnoldo Morale MD, Balinda Quails   . GERD (gastroesophageal reflux disease)   . Hyperlipidemia   . Hypertension   . Ulcer 1992   gastric   Past Surgical History:  Procedure Laterality Date  . COLONOSCOPY  01/2017   hx polyps/tics/hems  . COLONOSCOPY W/ POLYPECTOMY    . FOOT SURGERY Right    pain scraper several inches into foot  . POLYPECTOMY     Family History  Problem Relation Age of Onset  . Cancer Father        lung, former smoker, [place] lung in [place]  . Dementia Mother   . Hemochromatosis Brother   . Colon cancer Neg Hx   . Esophageal cancer Neg Hx   . Rectal cancer Neg Hx   . Stomach cancer Neg Hx   . Colon polyps Neg Hx    Social History   Socioeconomic History  . Marital status: Married    Spouse name: Not on file  . Number of children: Not on file  . Years of education: Not on file  . Highest education level: Not on file  Occupational History  . Not on file  Tobacco Use  . Smoking status: Former Smoker    Packs/day: 1.00    Years: 15.00    Pack years: 15.00    Types: Cigarettes  Quit date: 10/21/1978    Years since quitting: 41.2  . Smokeless tobacco: Never Used  Substance and Sexual Activity  . Alcohol use: Yes    Alcohol/week: 0.0 standard drinks    Comment: 2 drinks /week   . Drug use: No  . Sexual activity: Yes  Other Topics Concern  . Not on file  Social History Narrative   Married (wife pt of Dr. Maudie Mercury). 5 children (2 by first wife, 3 stepkids), 11 granddaughters + 2 greatgrandsons.       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 Difficulty of Paying Living Expenses:   Food Insecurity:   . Worried About Charity fundraiser in the Last Year:   . Arboriculturist in the Last Year:   Transportation Needs:   . Film/video editor (Medical):   [name]  Lack of Transportation (Non-Medical):   Physical Activity:   . Days of Exercise per Week:   . Minutes of Exercise per Session:   Stress:   . Feeling of Stress :   Social Connections:   . Frequency of Communication with Friends and Family:   . Frequency of Social Gatherings with Friends and Family:   . Attends Religious Services:   . Active Member of Clubs or Organizations:   . Attends Archivist Meetings:   [name] Marital Status:     Outpatient Encounter Medications as of 01/03/2020  Medication Sig  . amLODipine (NORVASC) 2.5 MG tablet Take 1 tablet (2.5 mg total) by mouth daily.  [name] aspirin 81 MG tablet Take 81 mg by mouth daily.    . Cyanocobalamin (B-12) 1000 MCG CAPS Take 1,000 mcg by mouth daily.  [name] gabapentin (NEURONTIN) 100 MG capsule Take 1-2 capsules (100-200 mg total) by mouth daily.  . irbesartan-hydrochlorothiazide (AVALIDE) 300-12.5 MG tablet Take 1 tablet by mouth daily.  . MULTIPLE VITAMIN PO Take by mouth daily.    . Omega-3 Fatty Acids (FISH OIL) 1000 MG CAPS Take 2 capsules by mouth 2 (two) times daily.    [name] omeprazole (PRILOSEC) 40 MG capsule TAKE 1 CAPSULE BY MOUTH EVERY DAY  . rosuvastatin (CRESTOR) 5 MG tablet Take 1 tablet three times a week  . sildenafil (VIAGRA) 100 MG tablet Take 1 tablet (100 mg total) by mouth daily as needed for erectile dysfunction.  . tizanidine (ZANAFLEX) 2 MG capsule Take 1 capsule (2 mg total) by mouth 2 (two) times daily as needed for muscle spasms.  . [EXPIRED] cyanocobalamin ((VITAMIN B-12)) injection 1,000 mcg    No facility-administered encounter medications on file as of 01/03/2020.    Activities of Daily Living In your present state of health, do you have any difficulty performing the following activities: 01/03/2020 10/28/2019  Hearing? N N  Vision? N N  Difficulty concentrating or making decisions? N N  Walking or climbing stairs? N N  Dressing or bathing? N N  Doing errands, shopping? N N  Preparing Food and eating ?  N -  Using the Toilet? N -  In the past six months, have you accidently leaked urine? N -  Do you have problems with loss of bowel control? N -  Managing your Medications? N -  Managing your Finances? N -  Housekeeping or managing your Housekeeping? N -  Some  recent data might be hidden    Patient Care Team: Marin Olp, MD as PCP - General (Family Medicine)   Assessment:   This is a routine wellness examination for Jaquane.  Exercise Activities and Dietary recommendations Current Exercise Habits: The patient has a physically strenuous job, but has no regular exercise apart from work.  Goals    . Weight (lb) < 200 lb (90.7 kg)     Did watch what he eat  Check out  online nutrition programs as GumSearch.nl and http://vang.com/; fit12me; Look for foods with "whole" wheat; bran; oatmeal etc Shot at the farmer's markets in season for fresher choices  Watch for "hydrogenated" on the label of oils which are trans-fats.  Watch for "high fructose corn syrup" in snacks, yogurt or ketchup  Meats have less marbling; bright colored fruits and vegetables;  Canned; dump out liquid and wash vegetables. Be mindful of what we are eating  Portion control is essential to a health weight! Sit down; take a break and enjoy your meal; take smaller bites; put the fork down between bites;  It takes 20 minutes to get full; so check in with your fullness cues and stop eating when you start to fill full              Fall Risk Fall Risk  01/03/2020 12/03/2019 06/22/2019 07/09/2018 04/01/2017  Falls in the past year? 0 0 0 No No  Number falls in past yr: 0 0 0 - -  Injury with Fall? 0 0 0 - -  Follow up Education provided;Falls prevention discussed;Falls evaluation completed - - - -   Is the patient's home free of loose throw rugs in walkways, pet beds, electrical cords, etc?   yes      Grab bars in the bathroom? yes      Handrails on the stairs?   yes      Adequate lighting?   yes  Timed  Get Up and Go Performed: completed and within normal timeframe; no gait abnormalities noted   Depression Screen PHQ 2/9 Scores 01/03/2020 10/28/2019 06/22/2019 10/26/2018  PHQ - 2 Score 0 0 0 0    Cognitive Function MMSE - Mini Mental State Exam 07/09/2018  Not completed: (No Data)     6CIT Screen 01/03/2020  What Year? 0 points  What month? 0 points  What time? 0 points  Count back from 20 0 points  Months in reverse 0 points  Repeat phrase 0 points  Total Score 0    Immunization History  Administered Date(s) Administered  . Fluad Quad(high Dose 65+) 06/22/2019  . Influenza Split 07/30/2011, 07/10/2012  . Influenza Whole 08/04/2007, 07/21/2008, 07/18/2009, 07/16/2010  . Influenza, High Dose Seasonal PF 07/16/2016, 07/17/2017, 07/09/2018  . Influenza,inj,Quad PF,6+ Mos 07/05/2013, 08/03/2014, 07/13/2015  . PFIZER SARS-COV-2 Vaccination 11/21/2019, 12/12/2019  . Pneumococcal Conjugate-13 01/09/2016  . Pneumococcal Polysaccharide-23 04/01/2017  . Td 10/22/1999, 12/11/2009    Qualifies for Shingles Vaccine? Discussed and patient will check with pharmacy for coverage.  Patient education handout provided   Screening Tests Health Maintenance  Topic Date Due  . TETANUS/TDAP  12/12/2019  . Hepatitis C Screening  10/17/2098 (Originally February 07, 1951)  . COLONOSCOPY  10/27/2020  . INFLUENZA VACCINE  Completed  . PNA vac Low Risk Adult  Completed   Cancer Screenings: Lung: Low Dose CT Chest recommended if Age 55-80 years, 30 pack-year currently smoking OR have quit w/in 15years. Patient does not qualify. Colorectal: colonoscopy 10/27/17     Plan:  I have personally reviewed and addressed the Medicare Annual Wellness questionnaire and have noted the following in the patient's chart:  A. Medical and social history B. Use of alcohol, tobacco or illicit drugs  C. Current medications and supplements D. Functional ability and status E.  Nutritional status F.  Physical activity G. Advance  directives H. List of other physicians I.  Hospitalizations, surgeries, and ER visits in previous 12 months J.  [place] such as hearing and vision if needed, cognitive and depression L. Referrals, records requested, and appointments- none   In addition, I have reviewed and discussed with patient certain preventive protocols, quality metrics, and best practice recommendations. A written personalized care plan for preventive services as well as general preventive health recommendations were provided to patient.   Signed,  Denman George, LPN  Nurse Health Advisor   Nurse Notes: Patient given B12 injection as ordered.  Tolerated injection well with no complaints

## 2020-01-03 NOTE — Progress Notes (Signed)
Phone [phone] In person visit   Subjective:   Larry Williamson is a 69 y.o. year old very pleasant male patient who presents for/with See problem oriented charting Chief Complaint  Patient presents with  . Hypertension  . Hyperlipidemia    This visit occurred during the SARS-CoV-2 public health emergency.  Safety protocols were in place, including screening questions prior to the visit, additional usage of staff PPE, and extensive cleaning of exam room while observing appropriate contact time as indicated for disinfecting solutions.   Past Medical History-  Patient Active Problem List   Diagnosis Date Noted  . AAA (abdominal aortic aneurysm) ([place]) 01/18/2016    Priority: High  . B12 deficiency 12/03/2019    Priority: Medium  . Meralgia paresthetica 12/28/2014    Priority: Medium  . Hyperlipidemia 05/15/2007    Priority: Medium  . Essential hypertension 05/15/2007    Priority: Medium  . Dupuytren's contracture of right hand 10/28/2019    Priority: Low  . Hyperglycemia 10/01/2016    Priority: Low  . History of skin cancer 12/28/2014    Priority: Low  . Former smoker 12/28/2014    Priority: Low  . Abnormal liver enzymes 12/29/2010    Priority: Low  . Allergic rhinitis 06/17/2008    Priority: Low  . ERECTILE DYSFUNCTION 12/07/2007    Priority: Low  . History of colonic polyps 12/07/2007    Priority: Low  . Umbilical hernia [id number]    Priority: Low  . GERD 05/15/2007    Priority: Low    Medications- reviewed and updated Current Outpatient Medications  Medication Sig Dispense Refill  . amLODipine (NORVASC) 2.5 MG tablet Take 1 tablet (2.5 mg total) by mouth daily. 90 tablet 3  . aspirin 81 MG tablet Take 81 mg by mouth daily.      . Cyanocobalamin (B-12) 1000 MCG CAPS Take 1,000 mcg by mouth daily. 90 capsule 3  . gabapentin (NEURONTIN) 100 MG capsule Take 1-2 capsules (100-200 mg total) by mouth daily. 120 capsule 3  . irbesartan-hydrochlorothiazide  (AVALIDE) 300-12.5 MG tablet Take 1 tablet by mouth daily. 90 tablet 3  . MULTIPLE VITAMIN PO Take by mouth daily.      . Omega-3 Fatty Acids (FISH OIL) 1000 MG CAPS Take 2 capsules by mouth 2 (two) times daily.      [name] omeprazole (PRILOSEC) 40 MG capsule TAKE 1 CAPSULE BY MOUTH EVERY DAY 90 capsule 1  . rosuvastatin (CRESTOR) 5 MG tablet Take 1 tablet three times a week 12 tablet 5  . sildenafil (VIAGRA) 100 MG tablet Take 1 tablet (100 mg total) by mouth daily as needed for erectile dysfunction. 10 tablet 5  . tizanidine (ZANAFLEX) 2 MG capsule Take 1 capsule (2 mg total) by mouth 2 (two) times daily as needed for muscle spasms. 40 capsule 2   No current facility-administered medications for this visit.     Objective:  BP 132/78   Pulse 67   Temp 97.7 F (36.5 C) (Temporal)   Ht 5\' 9"  (1.753 m)   Wt 238 lb 12.8 oz (108.3 kg)   SpO2 96%   BMI 35.26 kg/m  Gen: NAD, resting comfortably CV: RRR no murmurs rubs or gallops Lungs: CTAB no crackles, wheeze, rhonchi Abdomen: soft/nontender/nondistended/normal bowel sounds. No rebound or guarding. Umbilical hernia Ext: no edema Skin: warm, dry     Assessment and Plan  # Hypertension S:Has been taking all medications. Home readings have been 140/70's. He only checks 2-3 times a week. No  swelling in legs. Has 2 cuffs at home- he is going to try both and also bring them to next visit.   Patient is on irbesartan hydrochlorothiazide 300-12.5 mg daily as well as amlodipine 2.5 mg.  He did retrieve a prescription from the pharmacy with 300 mg irbesartan alone without hydrochlorothiazide-our team is going to call and check with the pharmacy when they open to see if he can get a refill of the appropriate medication-we sent in the medication appropriately on October 28, 2019 and it was filled erroneously (verses this is based off an old fill). BP Readings from Last 3 Encounters:  01/03/20 132/78  12/03/19 (!) 144/78  10/28/19 140/68  A/P: blood  pressure is well controlled in office with addition of amlodipine.  We will continue this as well as irbesartan hctz 300-12.5 mg. We will follow up in 6 months. He will bring home cuffs to next visit.   # B12 Deficiency S: #s looking better on injections Lab Results  Component Value Date   [id number] 10/28/2019  A/P: patient would prefer to do injections over pill trial- he will schedule for monthly visits and get updated [id number] today   # Umbilical hernia S: no issues recently  A/P: discussed risk of strangulation- if he has worsening pain pattern then should seek care immediately- luckily no pain recently  -discussed getting in with surgeon as not reducible- he declines for now- hoping when he pulls back at work he will be able to  #hyperlipidemia S: compliant with crestor now once a week. Previously has set back with cramps on 3x a week- last visit we discussed trying twice a week again and he is finally rady to make that move.  Lab Results  Component Value Date   CHOL 189 10/28/2019   HDL 43.10 10/28/2019   LDLCALC 114 (H) 10/28/2019   LDLDIRECT 135.0 07/29/2019   TRIG 159.0 (H) 10/28/2019   CHOLHDL 4 10/28/2019   A/P: mild poor control but on tolerable dose- will try twice a week again and can recheck LDL at next visit- would really like LDL below 100 if not below 70. Also advised nutritionfacts.org   Recommended follow up:  Future Appointments  Date Time Provider [place] [place]  01/03/2020 10:00 AM Bernita Raisin, LPN [facility]   Lab/Order associations:   ICD-10-CM   1. Essential hypertension  I10   2. Hyperlipidemia, unspecified hyperlipidemia type  E78.5   3. B12 deficiency  E53.8   4. Umbilical hernia without obstruction and without gangrene  K42.9    Return precautions advised.  Garret Reddish, MD

## 2020-01-03 NOTE — Patient Instructions (Signed)
Larry Williamson , Thank you for taking time to come for your Medicare Wellness Visit. I appreciate your ongoing commitment to your health goals. Please review the following plan we discussed and let me know if I can assist you in the future.   Screening recommendations/referrals: Colorectal Screening: up to date; last colonoscopy 10/27/17  Vision and Dental Exams: Recommended annual ophthalmology exams for early detection of glaucoma and other disorders of the eye Recommended annual dental exams for proper oral hygiene  Vaccinations: Influenza vaccine: completed 06/22/19 Pneumococcal vaccine: up to date; last 04/01/17 Tdap vaccine: last 12/11/09 recommended every 10 years; Please call your insurance company to determine your out of pocket expense. You may receive this vaccine at your local pharmacy or Health Dept. Shingles vaccine:  You may receive this vaccine at your local pharmacy. (see handout)   Advanced directives: Please bring a copy of your POA (Power of Attorney) and/or Living Will to your next appointment.  Goals: Recommend to drink at least 6-8 8oz glasses of water per day and consume a balanced diet rich in fresh fruits and vegetables.   Next appointment: Please schedule your Annual Wellness Visit with your Nurse Health Advisor in one year.  Preventive Care 70 Years and Older, Male Preventive care refers to lifestyle choices and visits with your health care provider that can promote health and wellness. What does preventive care include?  A yearly physical exam. This is also called an annual well check.  Dental exams once or twice a year.  Routine eye exams. Ask your health care provider how often you should have your eyes checked.  Personal lifestyle choices, including:  Daily care of your teeth and gums.  Regular physical activity.  Eating a healthy diet.  Avoiding tobacco and drug use.  Limiting alcohol use.  Practicing safe sex.  Taking low doses of aspirin every  day if recommended by your health care provider..  Taking vitamin and mineral supplements as recommended by your health care provider. What happens during an annual well check? The services and screenings done by your health care provider during your annual well check will depend on your age, overall health, lifestyle risk factors, and family history of disease. Counseling  Your health care provider may ask you questions about your:  Alcohol use.  Tobacco use.  Drug use.  Emotional well-being.  Home and relationship well-being.  Sexual activity.  Eating habits.  History of falls.  Memory and ability to understand (cognition).  Work and work Statistician. Screening  You may have the following tests or measurements:  Height, weight, and BMI.  Blood pressure.  Lipid and cholesterol levels. These may be checked every 5 years, or more frequently if you are over 70 years old.  Skin check.  Lung cancer screening. You may have this screening every year starting at age 70 if you have a 30-pack-year history of smoking and currently smoke or have quit within the past 15 years.  Fecal occult blood test (FOBT) of the stool. You may have this test every year starting at age 70.  Flexible sigmoidoscopy or colonoscopy. You may have a sigmoidoscopy every 5 years or a colonoscopy every 10 years starting at age 70.  Prostate cancer screening. Recommendations will vary depending on your family history and other risks.  Hepatitis C blood test.  Hepatitis B blood test.  Sexually transmitted disease (STD) testing.  Diabetes screening. This is done by checking your blood sugar (glucose) after you have not eaten for a while (  fasting). You may have this done every 1-3 years.  Abdominal aortic aneurysm (AAA) screening. You may need this if you are a current or former smoker.  Osteoporosis. You may be screened starting at age 70 if you are at high risk. Talk with your health care provider  about your test results, treatment options, and if necessary, the need for more tests. Vaccines  Your health care provider may recommend certain vaccines, such as:  Influenza vaccine. This is recommended every year.  Tetanus, diphtheria, and acellular pertussis (Tdap, Td) vaccine. You may need a Td booster every 10 years.  Zoster vaccine. You may need this after age 70.  Pneumococcal 13-valent conjugate (PCV13) vaccine. One dose is recommended after age 70.  Pneumococcal polysaccharide (PPSV23) vaccine. One dose is recommended after age 70. Talk to your health care provider about which screenings and vaccines you need and how often you need them. This information is not intended to replace advice given to you by your health care provider. Make sure you discuss any questions you have with your health care provider. Document Released: 11/03/2015 Document Revised: 06/26/2016 Document Reviewed: 08/08/2015 Elsevier Interactive Patient Education  2017 [place] Prevention in the Home Falls can cause injuries. They can happen to people of all ages. There are many things you can do to make your home safe and to help prevent falls. What can I do on the outside of my home?  Regularly fix the edges of walkways and driveways and fix any cracks.  Remove anything that might make you trip as you walk through a door, such as a raised step or threshold.  Trim any bushes or trees on the path to your home.  Use bright outdoor lighting.  Clear any walking paths of anything that might make someone trip, such as rocks or tools.  Regularly check to see if handrails are loose or broken. Make sure that both sides of any steps have handrails.  Any raised decks and porches should have guardrails on the edges.  Have any leaves, snow, or ice cleared regularly.  Use sand or salt on walking paths during winter.  Clean up any spills in your garage right away. This includes oil or grease  spills. What can I do in the bathroom?  Use night lights.  Install grab bars by the toilet and in the tub and shower. Do not use towel bars as grab bars.  Use non-skid mats or decals in the tub or shower.  If you need to sit down in the shower, use a plastic, non-slip stool.  Keep the floor dry. Clean up any water that spills on the floor as soon as it happens.  Remove soap buildup in the tub or shower regularly.  Attach bath mats securely with double-sided non-slip rug tape.  Do not have throw rugs and other things on the floor that can make you trip. What can I do in the bedroom?  Use night lights.  Make sure that you have a light by your bed that is easy to reach.  Do not use any sheets or blankets that are too big for your bed. They should not hang down onto the floor.  Have a firm chair that has side arms. You can use this for support while you get dressed.  Do not have throw rugs and other things on the floor that can make you trip. What can I do in the kitchen?  Clean up any spills right away.  Avoid  walking on wet floors.  Keep items that you use a lot in easy-to-reach places.  If you need to reach something above you, use a strong step stool that has a grab bar.  Keep electrical cords out of the way.  Do not use floor polish or wax that makes floors slippery. If you must use wax, use non-skid floor wax.  Do not have throw rugs and other things on the floor that can make you trip. What can I do with my stairs?  Do not leave any items on the stairs.  Make sure that there are handrails on both sides of the stairs and use them. Fix handrails that are broken or loose. Make sure that handrails are as long as the stairways.  Check any carpeting to make sure that it is firmly attached to the stairs. Fix any carpet that is loose or worn.  Avoid having throw rugs at the top or bottom of the stairs. If you do have throw rugs, attach them to the floor with carpet  tape.  Make sure that you have a light switch at the top of the stairs and the bottom of the stairs. If you do not have them, ask someone to add them for you. What else can I do to help prevent falls?  Wear shoes that:  Do not have high heels.  Have rubber bottoms.  Are comfortable and fit you well.  Are closed at the toe. Do not wear sandals.  If you use a stepladder:  Make sure that it is fully opened. Do not climb a closed stepladder.  Make sure that both sides of the stepladder are locked into place.  Ask someone to hold it for you, if possible.  Clearly mark and make sure that you can see:  Any grab bars or handrails.  First and last steps.  Where the edge of each step is.  Use tools that help you move around (mobility aids) if they are needed. These include:  Canes.  Walkers.  Scooters.  Crutches.  Turn on the lights when you go into a dark area. Replace any light bulbs as soon as they burn out.  Set up your furniture so you have a clear path. Avoid moving your furniture around.  If any of your floors are uneven, fix them.  If there are any pets around you, be aware of where they are.  Review your medicines with your doctor. Some medicines can make you feel dizzy. This can increase your chance of falling. Ask your doctor what other things that you can do to help prevent falls. This information is not intended to replace advice given to you by your health care provider. Make sure you discuss any questions you have with your health care provider. Document Released: 08/03/2009 Document Revised: 03/14/2016 Document Reviewed: 11/11/2014 Elsevier Interactive Patient Education  2017 [employer].

## 2020-01-04 DIAGNOSIS — H2513 Age-related nuclear cataract, bilateral: Secondary | ICD-10-CM | POA: Diagnosis not present

## 2020-02-03 ENCOUNTER — Ambulatory Visit (INDEPENDENT_AMBULATORY_CARE_PROVIDER_SITE_OTHER): Payer: Medicare Other

## 2020-02-03 ENCOUNTER — Other Ambulatory Visit: Payer: Self-pay

## 2020-02-03 DIAGNOSIS — E538 Deficiency of other specified B group vitamins: Secondary | ICD-10-CM | POA: Diagnosis not present

## 2020-02-03 MED ORDER — CYANOCOBALAMIN 1000 MCG/ML IJ SOLN
1000.0000 ug | Freq: Once | INTRAMUSCULAR | Status: AC
  Administered 2020-02-03: 09:00:00 1000 ug via INTRAMUSCULAR

## 2020-02-03 NOTE — Progress Notes (Signed)
Per orders of Dr. Yong Channel, injection of B12 given by Serita Sheller in left deltoid. Patient tolerated injection well. Patient will make appointment for 1 month.

## 2020-02-25 ENCOUNTER — Other Ambulatory Visit: Payer: Self-pay | Admitting: Family Medicine

## 2020-03-02 ENCOUNTER — Other Ambulatory Visit: Payer: Self-pay

## 2020-03-02 ENCOUNTER — Ambulatory Visit (INDEPENDENT_AMBULATORY_CARE_PROVIDER_SITE_OTHER): Payer: Medicare Other

## 2020-03-02 DIAGNOSIS — E538 Deficiency of other specified B group vitamins: Secondary | ICD-10-CM

## 2020-03-02 MED ORDER — CYANOCOBALAMIN 1000 MCG/ML IJ SOLN
1000.0000 ug | Freq: Once | INTRAMUSCULAR | Status: AC
  Administered 2020-03-02: 1000 ug via INTRAMUSCULAR

## 2020-03-02 NOTE — Progress Notes (Signed)
Per orders of Dr. Hunter, injection of B12 given by Zannah Melucci in right deltoid. Patient tolerated injection well.  

## 2020-04-06 ENCOUNTER — Other Ambulatory Visit: Payer: Self-pay

## 2020-04-06 ENCOUNTER — Ambulatory Visit (INDEPENDENT_AMBULATORY_CARE_PROVIDER_SITE_OTHER): Payer: Medicare Other

## 2020-04-06 DIAGNOSIS — E538 Deficiency of other specified B group vitamins: Secondary | ICD-10-CM

## 2020-04-06 MED ORDER — CYANOCOBALAMIN 1000 MCG/ML IJ SOLN
1000.0000 ug | Freq: Once | INTRAMUSCULAR | Status: AC
  Administered 2020-04-06: 1000 ug via INTRAMUSCULAR

## 2020-04-06 NOTE — Progress Notes (Signed)
Per orders of Dr. Yong Channel, injection of vitamin B12 given by Loralyn Freshwater. Patient tolerated injection well.

## 2020-04-18 ENCOUNTER — Other Ambulatory Visit: Payer: Self-pay | Admitting: Family Medicine

## 2020-05-04 ENCOUNTER — Ambulatory Visit (INDEPENDENT_AMBULATORY_CARE_PROVIDER_SITE_OTHER): Payer: Medicare Other

## 2020-05-04 ENCOUNTER — Other Ambulatory Visit: Payer: Self-pay

## 2020-05-04 DIAGNOSIS — E538 Deficiency of other specified B group vitamins: Secondary | ICD-10-CM | POA: Diagnosis not present

## 2020-05-04 MED ORDER — CYANOCOBALAMIN 1000 MCG/ML IJ SOLN
1000.0000 ug | Freq: Once | INTRAMUSCULAR | Status: AC
  Administered 2020-05-04: 1000 ug via INTRAMUSCULAR

## 2020-05-04 NOTE — Progress Notes (Signed)
Per orders of Dr. Yong Channel  injection of Vitamin B 12  given by Loralyn Freshwater.Patient tolerated injection well.

## 2020-05-15 ENCOUNTER — Ambulatory Visit: Payer: Medicare Other | Admitting: Dermatology

## 2020-05-17 ENCOUNTER — Telehealth: Payer: Self-pay | Admitting: Family Medicine

## 2020-05-17 NOTE — Progress Notes (Signed)
  Chronic Care Management   Outreach Note  05/17/2020 Name: Larry Williamson MRN: [id number] DOB: 01/12/1951  Referred by: Marin Olp, MD Reason for referral : No chief complaint on file.   An unsuccessful telephone outreach was attempted today. The patient was referred to the pharmacist for assistance with care management and care coordination.   Follow Up Plan:   Earney Hamburg Upstream Scheduler

## 2020-06-03 ENCOUNTER — Other Ambulatory Visit: Payer: Self-pay | Admitting: Family Medicine

## 2020-06-08 ENCOUNTER — Telehealth: Payer: Self-pay | Admitting: Family Medicine

## 2020-06-08 ENCOUNTER — Ambulatory Visit (INDEPENDENT_AMBULATORY_CARE_PROVIDER_SITE_OTHER): Payer: Medicare Other

## 2020-06-08 ENCOUNTER — Other Ambulatory Visit: Payer: Self-pay

## 2020-06-08 DIAGNOSIS — E538 Deficiency of other specified B group vitamins: Secondary | ICD-10-CM | POA: Diagnosis not present

## 2020-06-08 MED ORDER — CYANOCOBALAMIN 1000 MCG/ML IJ SOLN
1000.0000 ug | Freq: Once | INTRAMUSCULAR | Status: AC
  Administered 2020-06-08: 1000 ug via INTRAMUSCULAR

## 2020-06-08 NOTE — Progress Notes (Addendum)
Per orders of Dr. Yong Channel, injection of Vitamin b12 given by Tobe Sos in left deltoid. Patient tolerated injection well. Patient will make appointment for 1 month.

## 2020-06-08 NOTE — Progress Notes (Signed)
  Chronic Care Management   Note  06/08/2020 Name: Larry Williamson MRN: [id number] DOB: 1951-02-15  [name] is a 69 y.o. year old male who is a primary care patient of Marin Olp, MD. I reached out to [name] by phone today in response to a referral sent by [name]'s PCP, Marin Olp, MD.   [name] was given information about Chronic Care Management services today including:  1. CCM service includes personalized support from designated clinical staff supervised by his physician, including individualized plan of care and coordination with other care providers 2. 24/7 contact phone numbers for assistance for urgent and routine care needs. 3. Service will only be billed when office clinical staff spend 20 minutes or more in a month to coordinate care. 4. Only one practitioner may furnish and bill the service in a calendar month. 5. The patient may stop CCM services at any time (effective at the end of the month) by phone call to the office staff.   Patient agreed to services and verbal consent obtained.   Follow up plan:   Earney Hamburg Upstream Scheduler

## 2020-07-04 ENCOUNTER — Other Ambulatory Visit: Payer: Self-pay | Admitting: Family Medicine

## 2020-07-06 ENCOUNTER — Other Ambulatory Visit: Payer: Self-pay

## 2020-07-06 ENCOUNTER — Ambulatory Visit: Payer: Medicare Other

## 2020-07-06 ENCOUNTER — Ambulatory Visit (INDEPENDENT_AMBULATORY_CARE_PROVIDER_SITE_OTHER): Payer: Medicare Other | Admitting: Family Medicine

## 2020-07-06 ENCOUNTER — Encounter: Payer: Self-pay | Admitting: Family Medicine

## 2020-07-06 VITALS — BP 130/78 | HR 66 | Temp 97.3°F | Resp 18 | Ht 69.0 in | Wt 234.4 lb

## 2020-07-06 DIAGNOSIS — T466X5A Adverse effect of antihyperlipidemic and antiarteriosclerotic drugs, initial encounter: Secondary | ICD-10-CM | POA: Insufficient documentation

## 2020-07-06 DIAGNOSIS — K429 Umbilical hernia without obstruction or gangrene: Secondary | ICD-10-CM

## 2020-07-06 DIAGNOSIS — I714 Abdominal aortic aneurysm, without rupture, unspecified: Secondary | ICD-10-CM

## 2020-07-06 DIAGNOSIS — E785 Hyperlipidemia, unspecified: Secondary | ICD-10-CM

## 2020-07-06 DIAGNOSIS — I10 Essential (primary) hypertension: Secondary | ICD-10-CM | POA: Diagnosis not present

## 2020-07-06 DIAGNOSIS — E538 Deficiency of other specified B group vitamins: Secondary | ICD-10-CM

## 2020-07-06 DIAGNOSIS — M791 Myalgia, unspecified site: Secondary | ICD-10-CM

## 2020-07-06 DIAGNOSIS — R739 Hyperglycemia, unspecified: Secondary | ICD-10-CM

## 2020-07-06 DIAGNOSIS — Z23 Encounter for immunization: Secondary | ICD-10-CM | POA: Diagnosis not present

## 2020-07-06 MED ORDER — ROSUVASTATIN CALCIUM 5 MG PO TABS
ORAL_TABLET | ORAL | 3 refills | Status: DC
Start: 2020-07-06 — End: 2021-10-02

## 2020-07-06 MED ORDER — CYANOCOBALAMIN 1000 MCG/ML IJ SOLN
1000.0000 ug | Freq: Once | INTRAMUSCULAR | Status: AC
  Administered 2020-07-06: 1000 ug via INTRAMUSCULAR

## 2020-07-06 NOTE — Addendum Note (Signed)
Addended by: Thomes Cake on: 07/06/2020 08:44 AM   Modules accepted: Orders

## 2020-07-06 NOTE — Addendum Note (Signed)
Addended by: Milton Ferguson D on: 07/06/2020 08:49 AM   Modules accepted: Orders

## 2020-07-06 NOTE — Progress Notes (Signed)
Phone [phone] In person visit   Subjective:   Larry Williamson is a 69 y.o. year old very pleasant male patient who presents for/with See problem oriented charting Chief Complaint  Patient presents with  . Hypertension  . Hyperlipidemia   This visit occurred during the SARS-CoV-2 public health emergency.  Safety protocols were in place, including screening questions prior to the visit, additional usage of staff PPE, and extensive cleaning of exam room while observing appropriate contact time as indicated for disinfecting solutions.   Past Medical History-  Patient Active Problem List   Diagnosis Date Noted  . AAA (abdominal aortic aneurysm) ([place]) 01/18/2016    Priority: High  . Myalgia due to statin 07/06/2020    Priority: Medium  . B12 deficiency 12/03/2019    Priority: Medium  . Hyperglycemia 10/01/2016    Priority: Medium  . Meralgia paresthetica 12/28/2014    Priority: Medium  . Hyperlipidemia 05/15/2007    Priority: Medium  . Essential hypertension 05/15/2007    Priority: Medium  . Dupuytren's contracture of right hand 10/28/2019    Priority: Low  . History of skin cancer 12/28/2014    Priority: Low  . Former smoker 12/28/2014    Priority: Low  . Abnormal liver enzymes 12/29/2010    Priority: Low  . Allergic rhinitis 06/17/2008    Priority: Low  . ERECTILE DYSFUNCTION 12/07/2007    Priority: Low  . History of colonic polyps 12/07/2007    Priority: Low  . Umbilical hernia 09/81/1914    Priority: Low  . GERD 05/15/2007    Priority: Low    Medications- reviewed and updated Current Outpatient Medications  Medication Sig Dispense Refill  . amLODipine (NORVASC) 2.5 MG tablet Take 1 tablet (2.5 mg total) by mouth daily. 90 tablet 3  . aspirin 81 MG tablet Take 81 mg by mouth daily.      . cetirizine (ZYRTEC) 10 MG tablet Take 10 mg by mouth daily.    . Cyanocobalamin (B-12) 1000 MCG CAPS Take 1,000 mcg by mouth daily. 90 capsule 3  . gabapentin  (NEURONTIN) 100 MG capsule TAKE 1 TO 2 CAPSULES(100 TO 200 MG) BY MOUTH DAILY 120 capsule 3  . irbesartan-hydrochlorothiazide (AVALIDE) 300-12.5 MG tablet Take 1 tablet by mouth daily. 90 tablet 3  . MULTIPLE VITAMIN PO Take by mouth daily.      . Omega-3 Fatty Acids (FISH OIL) 1000 MG CAPS Take 2 capsules by mouth daily.     [name] omeprazole (PRILOSEC) 40 MG capsule TAKE 1 CAPSULE BY MOUTH EVERY DAY 90 capsule 1  . rosuvastatin (CRESTOR) 5 MG tablet TAKE 1 TABLET BY MOUTH once a week 13 tablet 3  . sildenafil (VIAGRA) 100 MG tablet Take 1 tablet (100 mg total) by mouth daily as needed for erectile dysfunction. 10 tablet 5  . tizanidine (ZANAFLEX) 2 MG capsule TAKE 1 CAPSULE(2 MG) BY MOUTH TWICE DAILY AS NEEDED FOR MUSCLE SPASMS 40 capsule 2   No current facility-administered medications for this visit.     Objective:  BP 130/78   Pulse 66   Temp (!) 97.3 F (36.3 C) (Esophageal)   Resp 18   Ht 5\' 9"  (1.753 m)   Wt 234 lb 6.4 oz (106.3 kg)   SpO2 95%   BMI 34.61 kg/m  Gen: NAD, resting comfortably CV: RRR no murmurs rubs or gallops Lungs: CTAB no crackles, wheeze, rhonchi Ext: no edema Skin: warm, dry    Assessment and Plan   #hypertension S: medication: Irbesartan  hydrochlorothiazide 300-12.5 mg daily, amlodipine 2.5 mg Home readings #s: 130s typically but up to 140s at times. Bottom # under 80 BP Readings from Last 3 Encounters:  07/06/20 130/78  01/03/20 132/78  01/03/20 132/78  A/P: reasonable control- continue current meds -Last visit had slight decrease in GFR.  We will repeat blood work today  #hyperlipidemia #Statin myalgia S: Medication:Patient on Crestor 5 mg and typically tolerates at least once a week but we try to go up to 3 times a week- did not tolerate twice a week.  Typically with LDL over 100 A/P: LDL has been above goal. Update direct LDL. Continue once a week statin with myalgia higher than that- change rx. Encouraged need for healthy eating, regular  exercise, weight loss. Down 4 lbs from last visit- feels like from a lot of sweating/very active with work  #Aortic aneurysm S:Originally noted March 2017 with suprarenal fusiform AAA.  AAA better July 2018 with 2-year repeat planned.  No aneurysm noted September 2020 A/P: improved on imaging - we may do one more scan next year and if negative discontinue screenings and change diagnosis   # GERD S:Medication: Omeprazole 40 mg-has failed 20 mg trials in the past. A/P: reasonable control- I think if had more substantial weight loss perhaps to 215 or so could consider trial of 20 mg again   #B12 deficiency S:Patient continues on monthly B12 injections-prefers injections over oral.  Low B12 could be caused by omeprazole A/P: will give b12 injection today and update S06 level   #Umbilical hernia S:Patient has not had time related to work to consider surgical repair-not reducible. A/P: not bothering him lately- wants to hold off on surgery until he slows down at work. Knows risk strangulation   # allergies- taking zyrtec   Recommended follow up: Return in about 6 months (around 01/03/2021) for physical or sooner if needed. Future Appointments  Date Time Provider [place]  07/11/2020  7:45 AM Lavonna Monarch, MD [facility]  08/03/2020  9:00 AM [facility] NURSE [facility]  08/28/2020  9:00 AM [facility] CCM PHARMACIST [facility]  09/07/2020  9:00 AM [facility] NURSE [facility]  10/05/2020  9:00 AM [facility] NURSE [facility]    Lab/Order associations:   ICD-10-CM   1. Essential hypertension  T01 COMPLETE METABOLIC PANEL WITH GFR  2. Hyperlipidemia, unspecified hyperlipidemia type  S01.0 COMPLETE METABOLIC PANEL WITH GFR    LDL cholesterol, direct  3. B12 deficiency  E53.8 Vitamin B12  4. Abdominal aortic aneurysm (AAA) without rupture (HCC)  I71.4   5. Umbilical hernia without obstruction and without gangrene  K42.9   6. Hyperglycemia  R73.9   7. Myalgia due to statin  M79.10     T46.6X5A    Meds ordered this encounter  Medications  . rosuvastatin (CRESTOR) 5 MG tablet    Sig: TAKE 1 TABLET BY MOUTH once a week    Dispense:  13 tablet    Refill:  3   Return precautions advised.  Garret Reddish, MD

## 2020-07-06 NOTE — Patient Instructions (Addendum)
Health Maintenance Due  Topic Date Due  . TETANUS/TDAP  Patient will get at his pharmacy. Would be $120 here but suspect would be cheaper at pharmacy 12/12/2019  . INFLUENZA VACCINE in office flu shot- high dose  Plus get b12 injection today 05/21/2020   Please stop by lab before you go If you have mychart- we will send your results within 3 business days of [place] receiving them.  If you do not have mychart- we will call you about results within 5 business days of [place] receiving them.  *please note we are currently using [facility] which has a longer processing time than [facility] typically so labs may not come back as quickly as in the past *please also note that you will see labs on mychart as soon as they post. I will later go in and write notes on them- will say "notes from Dr. Yong Channel"  Thanks for trying twice a week on rosuvastatin- lets stick with once a week since you tolerate that better  Recommended follow up: Return in about 6 months (around 01/03/2021) for physical or sooner if needed.

## 2020-07-06 NOTE — Progress Notes (Deleted)
Duplicate note

## 2020-07-07 LAB — COMPLETE METABOLIC PANEL WITH GFR
AG Ratio: 1.4 (calc) (ref 1.0–2.5)
ALT: 51 U/L — ABNORMAL HIGH (ref 9–46)
AST: 37 U/L — ABNORMAL HIGH (ref 10–35)
Albumin: 4.2 g/dL (ref 3.6–5.1)
Alkaline phosphatase (APISO): 35 U/L (ref 35–144)
BUN: 20 mg/dL (ref 7–25)
CO2: 22 mmol/L (ref 20–32)
Calcium: 8.9 mg/dL (ref 8.6–10.3)
Chloride: 101 mmol/L (ref 98–110)
Creat: 1.02 mg/dL (ref 0.70–1.25)
GFR, Est African American: 87 mL/min/{1.73_m2} (ref >=60)
GFR, Est Non African American: 75 mL/min/{1.73_m2} (ref >=60)
Globulin: 2.9 g/dL (calc) (ref 1.9–3.7)
Glucose, Bld: 97 mg/dL (ref 65–99)
Potassium: 4 mmol/L (ref 3.5–5.3)
Sodium: 135 mmol/L (ref 135–146)
Total Bilirubin: 1.1 mg/dL (ref 0.2–1.2)
Total Protein: 7.1 g/dL (ref 6.1–8.1)

## 2020-07-07 LAB — LDL CHOLESTEROL, DIRECT: Direct LDL: 116 mg/dL — ABNORMAL HIGH (ref <100)

## 2020-07-07 LAB — VITAMIN B12: Vitamin B-12: >2000 pg/mL — ABNORMAL HIGH (ref 200–1100)

## 2020-07-11 ENCOUNTER — Other Ambulatory Visit: Payer: Self-pay

## 2020-07-11 ENCOUNTER — Ambulatory Visit: Payer: Medicare Other | Admitting: Dermatology

## 2020-07-11 ENCOUNTER — Encounter: Payer: Self-pay | Admitting: Dermatology

## 2020-07-11 DIAGNOSIS — D239 Other benign neoplasm of skin, unspecified: Secondary | ICD-10-CM

## 2020-07-11 DIAGNOSIS — Z1283 Encounter for screening for malignant neoplasm of skin: Secondary | ICD-10-CM | POA: Diagnosis not present

## 2020-07-11 DIAGNOSIS — L821 Other seborrheic keratosis: Secondary | ICD-10-CM | POA: Diagnosis not present

## 2020-07-11 DIAGNOSIS — L570 Actinic keratosis: Secondary | ICD-10-CM | POA: Diagnosis not present

## 2020-07-11 DIAGNOSIS — D2371 Other benign neoplasm of skin of right lower limb, including hip: Secondary | ICD-10-CM | POA: Diagnosis not present

## 2020-07-11 NOTE — Patient Instructions (Addendum)
Follow-up visit for Larry Williamson date of birth July 06, 1951 (happy 69th birthday next month [name]!).  He was concerned with a little feelable spot on the surgical site on the right shoulder but examination by eye and dermoscopy shows only a slightly thickened scar.  Crusts on the area proximal to the left elbow, the left knee, and below the right eye represent solar keratoses and were treated with 5 to 8-second liquid nitrogen freeze.  You can expect that these may swell and peel in the next 2 weeks.  I would appreciate a phone call in 1 month to tell me if the largest of these on the left arm has improved.  A little knot on the right upper inner knee is likely a dermatofibroma and as long as it is stable does not need freezing or biopsy.  The rest of the exam today of the legs and all the skin from the waist up showed no atypical moles or new non-mole skin cancer.  There are several rough spots on the back called seborrheic keratoses which do not currently require any intervention.  Routine follow-up in 1year, but I am happy to see you sooner if there is any issue.

## 2020-07-12 NOTE — Progress Notes (Signed)
   Follow-Up Visit   Subjective  Larry Williamson is a 69 y.o. male who presents for the following: Annual Exam (left upper arm, right cheek, left cheek, left knee - scaly ).  Annual skin exam. Location:  Duration:  Quality:  Associated Signs/Symptoms: Modifying Factors:  Severity:  Timing: Context:   Objective  Well appearing patient in no apparent distress; mood and affect are within normal limits.  A full examination was performed including scalp, head, eyes, ears, nose, lips, neck, chest, axillae, abdomen, back, buttocks, bilateral upper extremities, bilateral lower extremities, hands, feet, fingers, toes, fingernails, and toenails. All findings within normal limits unless otherwise noted below. Waist up skin examination performed.   Assessment & Plan    Follow-up visit for Larry Williamson date of birth 11-01-1950 (happy 69th birthday next month Larry Williamson!).  He was concerned with a little feelable spot on the surgical site on the right shoulder but examination by eye and dermoscopy shows only a slightly thickened scar.  Crusts on the area proximal to the left elbow, the left knee, and below the right eye represent solar keratoses and were treated with 5 to 8-second liquid nitrogen freeze.  You can expect that these may swell and peel in the next 2 weeks.  I would appreciate a phone call in 1 month to tell me if the largest of these on the left arm has improved.  A little knot on the right upper inner knee is likely a dermatofibroma and as long as it is stable does not need freezing or biopsy.  The rest of the exam today of the legs and all the skin from the waist up showed no atypical moles or new non-mole skin cancer.  There are several rough spots on the back called seborrheic keratoses which do not currently require any intervention.  Routine follow-up in 1year, but I am happy to see you sooner if there is any issue.     Encounter for screening for malignant neoplasm of  skin Right Breast  Yearly skin check  AK (actinic keratosis) (3) Left Elbow - Posterior; Left Knee - Anterior; Right Malar Cheek  Destruction of lesion - Left Elbow - Posterior, Left Knee - Anterior, Right Malar Cheek Complexity: simple   Destruction method: cryotherapy   Informed consent: discussed and consent obtained   Timeout:  patient name, date of birth, surgical site, and procedure verified Lesion destroyed using liquid nitrogen: Yes   Cryotherapy cycles:  5 Outcome: patient tolerated procedure well with no complications    Dermatofibroma Right Thigh - Anterior  Benign- okay to leave if stable  Seborrheic keratosis (3) Left Upper Back; Right Upper Back; Mid Back  No need for removal if stable- yearly skin check  Skin cancer screening performed today.   I, Lavonna Monarch, MD, have reviewed all documentation for this visit.  The documentation on 08/13/20 for the exam, diagnosis, procedures, and orders are all accurate and complete.

## 2020-07-14 ENCOUNTER — Other Ambulatory Visit: Payer: Self-pay | Admitting: Family Medicine

## 2020-08-03 ENCOUNTER — Other Ambulatory Visit: Payer: Self-pay

## 2020-08-03 ENCOUNTER — Ambulatory Visit (INDEPENDENT_AMBULATORY_CARE_PROVIDER_SITE_OTHER): Payer: Medicare Other

## 2020-08-03 DIAGNOSIS — E538 Deficiency of other specified B group vitamins: Secondary | ICD-10-CM

## 2020-08-03 MED ORDER — CYANOCOBALAMIN 1000 MCG/ML IJ SOLN
1000.0000 ug | Freq: Once | INTRAMUSCULAR | Status: AC
  Administered 2020-08-03: 1000 ug via INTRAMUSCULAR

## 2020-08-03 NOTE — Progress Notes (Signed)
Per orders of Dr. Hunter, injection of B-12 given by Kendahl Bumgardner D Sadye Kiernan in left deltoid. Patient tolerated injection well. Patient will make appointment for 1 month.  

## 2020-08-13 ENCOUNTER — Encounter: Payer: Self-pay | Admitting: Dermatology

## 2020-08-25 ENCOUNTER — Telehealth: Payer: Self-pay

## 2020-08-25 NOTE — Progress Notes (Signed)
Chronic Care Management Pharmacy Assistant   Name: Larry Williamson  MRN: [id number] DOB: 10-15-1951  Reason for Encounter: Initial Visit  Patient Questions:  1.  Have you seen any other providers since your last visit? Patient states he has not seen any other providers at this time.   2.  Any changes in your medicines or health?  Patient states no changes in his medications or health at this time.   Larry Williamson,  69 y.o. , male presents for their Initial CCM visit with the clinical pharmacist via telephone.  PCP : Marin Olp, MD  Allergies:  No Known Allergies  Medications: Outpatient Encounter Medications as of 08/25/2020  Medication Sig  . amLODipine (NORVASC) 2.5 MG tablet Take 1 tablet (2.5 mg total) by mouth daily.  [name] aspirin 81 MG tablet Take 81 mg by mouth daily.    . cetirizine (ZYRTEC) 10 MG tablet Take 10 mg by mouth daily.  . Cyanocobalamin (B-12) 1000 MCG CAPS Take 1,000 mcg by mouth daily.  [name] gabapentin (NEURONTIN) 100 MG capsule TAKE 1 TO 2 CAPSULES(100 TO 200 MG) BY MOUTH DAILY  . irbesartan-hydrochlorothiazide (AVALIDE) 300-12.5 MG tablet TAKE 1 TABLET BY MOUTH DAILY  . MULTIPLE VITAMIN PO Take by mouth daily.    . Omega-3 Fatty Acids (FISH OIL) 1000 MG CAPS Take 2 capsules by mouth daily.   [name] omeprazole (PRILOSEC) 40 MG capsule TAKE 1 CAPSULE BY MOUTH EVERY DAY  . rosuvastatin (CRESTOR) 5 MG tablet TAKE 1 TABLET BY MOUTH once a week  . sildenafil (VIAGRA) 100 MG tablet Take 1 tablet (100 mg total) by mouth daily as needed for erectile dysfunction.  . tizanidine (ZANAFLEX) 2 MG capsule TAKE 1 CAPSULE(2 MG) BY MOUTH TWICE DAILY AS NEEDED FOR MUSCLE SPASMS   No facility-administered encounter medications on file as of 08/25/2020.    Current Diagnosis: Patient Active Problem List   Diagnosis Date Noted  . Myalgia due to statin 07/06/2020  . B12 deficiency 12/03/2019  . Dupuytren's contracture of right hand 10/28/2019  . Hyperglycemia  10/01/2016  . AAA (abdominal aortic aneurysm) ([place]) 01/18/2016  . Meralgia paresthetica 12/28/2014  . History of skin cancer 12/28/2014  . Former smoker 12/28/2014  . Abnormal liver enzymes 12/29/2010  . Allergic rhinitis 06/17/2008  . ERECTILE DYSFUNCTION 12/07/2007  . History of colonic polyps 12/07/2007  . Umbilical hernia 42/59/5638  . Hyperlipidemia 05/15/2007  . Essential hypertension 05/15/2007  . GERD 05/15/2007   Have you seen any other providers since your last visit?  Patient states he has not seen any other providers since his last visit.  Any changes in your medications or health? Patient states no changes in his medications and health at this time.  Any side effects from any medications? Patient states he does not have any side effects from his medications at this time.    Do you have an symptoms or problems not managed by your medications?  Patient states he does not have any problems at this time.  Any concerns about your health right now?  Patient states he does not have any concerns about his health at this time.   Has your provider asked that you check blood pressure, blood sugar, or follow special diet at home? Patients states he takes his blood pressure every morning, he does not record his blood sugar, and he is not on a special diet at this time.  Do you get any type of exercise on a regular basis?  Patient states he walks a lot and he works at his job part time.  Can you think of a goal you would like to reach for your health? Patient states he would like to lose 20 pounds.   Do you have any problems getting your medications? Patient states he does not have any problems getting his medications at this time.  Is there anything that you would like to discuss during the appointment?  Patient states he does not have anything to discuss at this time.  Please bring medications and supplements to appointment   .Georgiana Shore ,[place] Pharmacist  Assistant [phone]  Follow-Up:  Pharmacist Review

## 2020-08-28 ENCOUNTER — Ambulatory Visit: Payer: Medicare Other

## 2020-08-28 DIAGNOSIS — I10 Essential (primary) hypertension: Secondary | ICD-10-CM

## 2020-08-28 DIAGNOSIS — E785 Hyperlipidemia, unspecified: Secondary | ICD-10-CM

## 2020-08-28 DIAGNOSIS — K219 Gastro-esophageal reflux disease without esophagitis: Secondary | ICD-10-CM

## 2020-08-28 NOTE — Patient Instructions (Addendum)
Mr. Larry Williamson,  That you for taking the time to talk with me today. Please review notes from our visit below and call me at [phone] with any questions!  Our next call is Wednesday 03/21/2021 at 1 pm but you can certainly contact me anytime before.   Thanks again, Ellin Mayhew, Pharm.D., BCGP Clinical Pharmacist [place] [phone]  Goals Addressed            This Visit's Progress   . PharmD Care Plan       CARE PLAN ENTRY (see longitudinal plan of care for additional care plan information)  Current Barriers:  . Chronic Disease Management support, education, and care coordination needs related to Hypertension, Hyperlipidemia, and GERD   Hypertension BP Readings from Last 3 Encounters:  07/06/20 130/78  01/03/20 132/78  01/03/20 132/78   . Pharmacist Clinical Goal(s): o Over the next 180 days, patient will work with PharmD and providers to achieve BP goal <130/80 . Current regimen:  o Amlodipine 2.5 mg once daily o Irbesartan-hctz 300-12.5 mg once daily . Interventions: o We discussed diet and exercise extensively. o We discussed home BP monitoring recommendations. . Patient self care activities - Over the next 180 days, patient will: o Check BP at home at least once every 1-2 weeks, document, and provide at future appointments o Use silver sneakers benefit, focus on fruit/vegetable intake, white meats over red meats, having nuts in place of sugary snacks. o Ensure daily salt intake < 2300 mg/day  Hyperlipidemia Lab Results  Component Value Date/Time   LDLCALC 114 (H) 10/28/2019 08:38 AM   LDLDIRECT 116 (H) 07/06/2020 08:49 AM   . Pharmacist Clinical Goal(s): o Over the next 180 days, patient will work with PharmD and providers to achieve LDL goal < 100 . Current regimen:  o Crestor 5 mg once daily . Interventions: o We discussed diet and exercise extensively  . Patient self care activities - Over the next 180 days, patient  will: o Use silver sneakers benefit, focus on fruit/vegetable intake, white meats over red meats, having nuts in place of sugary snacks. GERD . Pharmacist Clinical Goal(s) o Over the next 180 days, patient will work with PharmD and providers to minimize acid reflux symptoms . Current regimen:  o Omeprazole 40 mg once daily  . Interventions: o Reviewed dietary triggers of acid reflux o Medication related side effects - if able to get off of omeprazole may then be able to stop / reduce frequency of b12 injections.  . Patient self care activities - Over the next 180 days, patient will: o Be mindful of potential triggers such as alcohol, fatty foods, lying down after eating, and tomato sauce. o Weight loss to potentially help with reflux symptoms  Medication management . Pharmacist Clinical Goal(s): o Over the next 180 days, patient will work with PharmD and providers to maintain optimal medication adherence . Current pharmacy: [facility] . Interventions o Comprehensive medication review performed. o Continue current medication management strategy . Patient self care activities - Over the next 180 days, patient will: o Take medications as prescribed o Report any questions or concerns to PharmD and/or provider(s)  Initial goal documentation      The patient verbalized understanding of instructions provided today and agreed to receive a mailed copy of patient instruction and/or educational materials. Telephone follow up appointment with pharmacy team member scheduled for: See next appointment with "Care Management Staff" under "What's Next" below.   Madelin Rear, Pharm.D., BCGP  Clinical Pharmacist [facility] [phone]  Food Choices for Gastroesophageal Reflux Disease, Adult When you have gastroesophageal reflux disease (GERD), the foods you eat and your eating habits are very important. Choosing the right foods can help ease your discomfort. Think about working with a  nutrition specialist (dietitian) to help you make good choices. What are tips for following this plan?  Meals  Choose healthy foods that are low in fat, such as fruits, vegetables, whole grains, low-fat dairy products, and lean meat, fish, and poultry.  Eat small meals often instead of 3 large meals a day. Eat your meals slowly, and in a place where you are relaxed. Avoid bending over or lying down until 2-3 hours after eating.  Avoid eating meals 2-3 hours before bed.  Avoid drinking a lot of liquid with meals.  Cook foods using methods other than frying. Bake, grill, or broil food instead.  Avoid or limit: ? Chocolate. ? Peppermint or spearmint. ? Alcohol. ? Pepper. ? Black and decaffeinated coffee. ? Black and decaffeinated tea. ? Bubbly (carbonated) soft drinks. ? Caffeinated energy drinks and soft drinks.  Limit high-fat foods such as: ? Fatty meat or fried foods. ? Whole milk, cream, butter, or ice cream. ? Nuts and nut butters. ? Pastries, donuts, and sweets made with butter or shortening.  Avoid foods that cause symptoms. These foods may be different for everyone. Common foods that cause symptoms include: ? Tomatoes. ? Oranges, lemons, and limes. ? Peppers. ? Spicy food. ? Onions and garlic. ? Vinegar. Lifestyle  Maintain a healthy weight. Ask your doctor what weight is healthy for you. If you need to lose weight, work with your doctor to do so safely.  Exercise for at least 30 minutes for 5 or more days each week, or as told by your doctor.  Wear loose-fitting clothes.  Do not smoke. If you need help quitting, ask your doctor.  Sleep with the head of your bed higher than your feet. Use a wedge under the mattress or blocks under the bed frame to raise the head of the bed. Summary  When you have gastroesophageal reflux disease (GERD), food and lifestyle choices are very important in easing your symptoms.  Eat small meals often instead of 3 large meals a  day. Eat your meals slowly, and in a place where you are relaxed.  Limit high-fat foods such as fatty meat or fried foods.  Avoid bending over or lying down until 2-3 hours after eating.  Avoid peppermint and spearmint, caffeine, alcohol, and chocolate. This information is not intended to replace advice given to you by your health care provider. Make sure you discuss any questions you have with your health care provider. Document Revised: 01/28/2019 Document Reviewed: 11/12/2016 Elsevier Patient Education  [place].

## 2020-08-28 NOTE — Progress Notes (Signed)
Chronic Care Management Pharmacy  Name: Larry Williamson   MRN: [id number]   DOB: 1951-01-22  Chief Complaint/ HPI Larry Williamson, 69 y.o., male, presents for their initial CCM visit with the clinical pharmacist via telephone due to COVID-19 pandemic .  PCP : Marin Olp, MD Encounter Diagnoses  Name Primary?  . Hyperlipidemia, unspecified hyperlipidemia type Yes  . Essential hypertension   . Gastroesophageal reflux disease without esophagitis     Office Visits:  07/06/2020 (PCP): did not tolerate BIT or TIW crestor due to myalgia. Diet/exercise encouraged. Consider omeprazole reduction to 20 mg once daily with weight loss to 215. Has b12 deficiency, once monthly injections per patient preference. Return in 6 month for physical.   Patient Active Problem List   Diagnosis Date Noted  . Myalgia due to statin 07/06/2020  . B12 deficiency 12/03/2019  . Dupuytren's contracture of right hand 10/28/2019  . Hyperglycemia 10/01/2016  . AAA (abdominal aortic aneurysm) ([place]) 01/18/2016  . Meralgia paresthetica 12/28/2014  . History of skin cancer 12/28/2014  . Former smoker 12/28/2014  . Abnormal liver enzymes 12/29/2010  . Allergic rhinitis 06/17/2008  . ERECTILE DYSFUNCTION 12/07/2007  . History of colonic polyps 12/07/2007  . Umbilical hernia 81/82/9937  . Hyperlipidemia 05/15/2007  . Essential hypertension 05/15/2007  . GERD 05/15/2007   Past Surgical History:  Procedure Laterality Date  . COLONOSCOPY  01/2017   hx polyps/tics/hems  . COLONOSCOPY W/ POLYPECTOMY    . FOOT SURGERY Right    pain scraper several inches into foot  . POLYPECTOMY     Family History  Problem Relation Age of Onset  . Cancer Father        lung, former smoker, [place] lung in [place]  . Dementia Mother   . Hemochromatosis Brother   . Colon cancer Neg Hx   . Esophageal cancer Neg Hx   . Rectal cancer Neg Hx   . Stomach cancer Neg Hx   . Colon polyps Neg Hx    No Known Allergies    Outpatient Encounter Medications as of 08/28/2020  Medication Sig  . amLODipine (NORVASC) 2.5 MG tablet Take 1 tablet (2.5 mg total) by mouth daily.  [name] aspirin 81 MG tablet Take 81 mg by mouth daily.    . cetirizine (ZYRTEC) 10 MG tablet Take 10 mg by mouth daily.  . Cyanocobalamin (B-12) 1000 MCG CAPS Take 1,000 mcg by mouth daily.  [name] gabapentin (NEURONTIN) 100 MG capsule TAKE 1 TO 2 CAPSULES(100 TO 200 MG) BY MOUTH DAILY  . irbesartan-hydrochlorothiazide (AVALIDE) 300-12.5 MG tablet TAKE 1 TABLET BY MOUTH DAILY  . MULTIPLE VITAMIN PO Take by mouth daily.    . Omega-3 Fatty Acids (FISH OIL) 1000 MG CAPS Take 2 capsules by mouth daily.   [name] omeprazole (PRILOSEC) 40 MG capsule TAKE 1 CAPSULE BY MOUTH EVERY DAY  . rosuvastatin (CRESTOR) 5 MG tablet TAKE 1 TABLET BY MOUTH once a week  . sildenafil (VIAGRA) 100 MG tablet Take 1 tablet (100 mg total) by mouth daily as needed for erectile dysfunction.  . tizanidine (ZANAFLEX) 2 MG capsule TAKE 1 CAPSULE(2 MG) BY MOUTH TWICE DAILY AS NEEDED FOR MUSCLE SPASMS   No facility-administered encounter medications on file as of 08/28/2020.   Patient Care Team    Relationship Specialty Notifications Start End  Marin Olp, MD PCP - General Family Medicine  12/28/14   Madelin Rear, [facility] Pharmacist Pharmacist  06/08/20    Comment: [phone]  Lavonna Monarch, MD Consulting Physician  Dermatology  07/11/20    Current Diagnosis/Assessment: Goals Addressed            This Visit's Progress   . PharmD Care Plan       CARE PLAN ENTRY (see longitudinal plan of care for additional care plan information)  Current Barriers:  . Chronic Disease Management support, education, and care coordination needs related to Hypertension, Hyperlipidemia, and GERD   Hypertension BP Readings from Last 3 Encounters:  07/06/20 130/78  01/03/20 132/78  01/03/20 132/78   . Pharmacist Clinical Goal(s): o Over the next 180 days, patient will work with PharmD and  providers to achieve BP goal <130/80 . Current regimen:  o Amlodipine 2.5 mg once daily o Irbesartan-hctz 300-12.5 mg once daily . Interventions: o We discussed diet and exercise extensively. o We discussed home BP monitoring recommendations. . Patient self care activities - Over the next 180 days, patient will: o Check BP at home at least once every 1-2 weeks, document, and provide at future appointments o Use silver sneakers benefit, focus on fruit/vegetable intake, white meats over red meats, having nuts in place of sugary snacks. o Ensure daily salt intake < 2300 mg/day  Hyperlipidemia Lab Results  Component Value Date/Time   LDLCALC 114 (H) 10/28/2019 08:38 AM   LDLDIRECT 116 (H) 07/06/2020 08:49 AM   . Pharmacist Clinical Goal(s): o Over the next 180 days, patient will work with PharmD and providers to achieve LDL goal < 100 . Current regimen:  o Crestor 5 mg once daily . Interventions: o We discussed diet and exercise extensively  . Patient self care activities - Over the next 180 days, patient will: o Use silver sneakers benefit, focus on fruit/vegetable intake, white meats over red meats, having nuts in place of sugary snacks. GERD . Pharmacist Clinical Goal(s) o Over the next 180 days, patient will work with PharmD and providers to minimize acid reflux symptoms . Current regimen:  o Omeprazole 40 mg once daily  . Interventions: o Reviewed dietary triggers of acid reflux o Medication related side effects - if able to get off of omeprazole may then be able to stop / reduce frequency of b12 injections.  . Patient self care activities - Over the next 180 days, patient will: o Be mindful of potential triggers such as alcohol, fatty foods, lying down after eating, and tomato sauce. o Weight loss to potentially help with reflux symptoms  Medication management . Pharmacist Clinical Goal(s): o Over the next 180 days, patient will work with PharmD and providers to maintain  optimal medication adherence . Current pharmacy: [facility] . Interventions o Comprehensive medication review performed. o Continue current medication management strategy . Patient self care activities - Over the next 180 days, patient will: o Take medications as prescribed o Report any questions or concerns to PharmD and/or provider(s)  Initial goal documentation      Hypertension   BP goal <130/80  BP Readings from Last 3 Encounters:  07/06/20 130/78  01/03/20 132/78  01/03/20 132/78   Previous medications: n/a. Patient checks BP at home 1-2x per week. Recent home readings: 130s-140s/80s at home. Patient is currently at goal on the following medications:  . Irbesartan-hctz 300-12.5 mg once daily  . Amlodipine 2.5 mg once daily  We discussed diet and exercise extensively - see HLD.   Plan  Continue current medications.  Hyperlipidemia   LDL goal < 100  Lipid Panel     Component Value Date/Time   CHOL 189 10/28/2019 0838  TRIG 159.0 (H) 10/28/2019 0838   HDL 43.10 10/28/2019 0838   LDLCALC 114 (H) 10/28/2019 0838   LDLDIRECT 116 (H) 07/06/2020 0849    Hepatic Function Latest Ref Rng & Units 07/06/2020 10/28/2019 07/29/2019  Total Protein 6.1 - 8.1 g/dL 7.1 7.0 6.7  Albumin 3.5 - 5.2 g/dL - 4.2 4.2  AST 10 - 35 U/L 37(H) 24 33  ALT 9 - 46 U/L 51(H) 38 58(H)  Alk Phosphatase 39 - 117 U/L - 45 35(L)  Total Bilirubin 0.2 - 1.2 mg/dL 1.1 0.9 1.1  Bilirubin, Direct 0.0 - 0.3 mg/dL - - -    The 10-year ASCVD risk score Mikey Bussing DC Jr., et al., 2013) is: 22.2%   Values used to calculate the score:     Age: 14 years     Sex: Male     Is Non-Hispanic African American: No     Diabetic: No     Tobacco smoker: No     Systolic Blood Pressure: 703 mmHg     Is BP treated: Yes     HDL Cholesterol: 43.1 mg/dL     Total Cholesterol: 189 mg/dL   Patient has failed these meds in past: TIW BIW crestor (myalgia/cramps per pt) Patient is currently not at goal the following  medications:  . Crestor 20 mg once weekly  We discussed diet and exercise extensively - use silver sneakers benefit, focus on fruit/vegetable intake, white meats over red meats, having nuts in place of sugary snacks. Walking most days.  Primary prevention. On asa. Denies any abnormal bruising, bleeding from nose or gums or blood in urine or stool. Patient is currently controlled on the following medications:  . Aspirin 81 mg once daily   Plan  Physical labs 12/2019. Continue current medications.  GERD   Lab Results  Component Value Date   VITAMINB12 >2,000 (H) 07/06/2020   Reports being unsuccessful with dose reduction in the past. At present, denies any acid reflux. Currently controlled on: . Omeprazole 40 mg once every day   We discussed: Avoidance of potential triggers such as alcohol, fatty foods, lying down after eating, and tomato sauce.  Discussed, benefit of weight loss including potential improvement in reflux symptoms.Reviewed possibility of dose reduction on omeprazole and link between vitamin b12 and long term omeprazole use.  Plan   Continue current medications. Review stopping or reducing dose of omeprazole as appropriate at f/u rph visit 03/2021.   Vaccines   Immunization History  Administered Date(s) Administered  . Fluad Quad(high Dose 65+) 06/22/2019, 07/06/2020  . Influenza Split 07/30/2011, 07/10/2012  . Influenza Whole 08/04/2007, 07/21/2008, 07/18/2009, 07/16/2010  . Influenza, High Dose Seasonal PF 07/16/2016, 07/17/2017, 07/09/2018  . Influenza,inj,Quad PF,6+ Mos 07/05/2013, 08/03/2014, 07/13/2015  . PFIZER SARS-COV-2 Vaccination 11/21/2019, 12/12/2019  . Pneumococcal Conjugate-13 01/09/2016  . Pneumococcal Polysaccharide-23 04/01/2017  . Td 10/22/1999, 12/11/2009   Due for booster, confirmed that patient had not received. Would need to get Tdap in pharmacy or [place].     Plan  Recommended patient receive pfizer covid booster and tdap.  Medication  Management / Care Coordination   Receives prescription medications from:  [facility], [address] [place] [address] Phone: [phone] Fax: [phone]  Expresses interest in pharmacy services - is wanting to review with wife and will call back if interested.   Plan  Continue current medication management strategy. ___________________________ SDOH (Social Determinants of  Health) assessments performed: Yes.  Future Appointments  Date Time Provider [place]  09/07/2020  9:00 AM [facility] NURSE [facility]  10/05/2020  9:00 AM [facility] NURSE [facility]  03/21/2021  1:00 PM [facility] CCM PHARMACIST [facility]  07/11/2021  7:45 AM Lavonna Monarch, MD [facility]   Visit follow-up:  . CPA follow-up: support onboarding w/ pharmacy services as applicable, February BP call, remind pt of upcoming visit with [place], ask if patent had thought anymore about wanting to stop omeprazole therapy. [name] [place] follow-up: 6-8 month telephone visit.  Madelin Rear, Pharm.D., BCGP Clinical Pharmacist [facility] [phone]

## 2020-09-07 ENCOUNTER — Ambulatory Visit (INDEPENDENT_AMBULATORY_CARE_PROVIDER_SITE_OTHER): Payer: Medicare Other | Admitting: *Deleted

## 2020-09-07 ENCOUNTER — Other Ambulatory Visit: Payer: Self-pay

## 2020-09-07 DIAGNOSIS — E538 Deficiency of other specified B group vitamins: Secondary | ICD-10-CM

## 2020-09-07 MED ORDER — CYANOCOBALAMIN 1000 MCG/ML IJ SOLN
1000.0000 ug | Freq: Once | INTRAMUSCULAR | Status: AC
  Administered 2020-09-07: 1000 ug via INTRAMUSCULAR

## 2020-09-07 NOTE — Progress Notes (Signed)
I have reviewed and agree with note, evaluation, plan.   Jaxston Chohan, MD  

## 2020-09-07 NOTE — Progress Notes (Signed)
Per orders of Dr. Yong Channel, injection of Cyanocobalamin 1000 mcg/1 ml given by Kelby Fam in right deltoid. Patient tolerated injection well. Patient will make appointment for 1 month

## 2020-09-19 ENCOUNTER — Other Ambulatory Visit: Payer: Self-pay | Admitting: *Deleted

## 2020-09-19 DIAGNOSIS — E785 Hyperlipidemia, unspecified: Secondary | ICD-10-CM

## 2020-09-19 DIAGNOSIS — I10 Essential (primary) hypertension: Secondary | ICD-10-CM

## 2020-10-05 ENCOUNTER — Other Ambulatory Visit: Payer: Self-pay | Admitting: Family Medicine

## 2020-10-05 ENCOUNTER — Other Ambulatory Visit: Payer: Self-pay

## 2020-10-05 ENCOUNTER — Ambulatory Visit (INDEPENDENT_AMBULATORY_CARE_PROVIDER_SITE_OTHER): Payer: Medicare Other

## 2020-10-05 DIAGNOSIS — E538 Deficiency of other specified B group vitamins: Secondary | ICD-10-CM

## 2020-10-05 MED ORDER — CYANOCOBALAMIN 1000 MCG/ML IJ SOLN
1000.0000 ug | Freq: Once | INTRAMUSCULAR | Status: AC
  Administered 2020-10-05: 1000 ug via INTRAMUSCULAR

## 2020-10-18 ENCOUNTER — Ambulatory Visit (INDEPENDENT_AMBULATORY_CARE_PROVIDER_SITE_OTHER): Payer: Medicare Other

## 2020-10-18 ENCOUNTER — Ambulatory Visit: Payer: Medicare Other | Admitting: Orthopaedic Surgery

## 2020-10-18 ENCOUNTER — Ambulatory Visit: Payer: Self-pay

## 2020-10-18 ENCOUNTER — Encounter: Payer: Self-pay | Admitting: Orthopaedic Surgery

## 2020-10-18 DIAGNOSIS — M25551 Pain in right hip: Secondary | ICD-10-CM

## 2020-10-18 DIAGNOSIS — M25552 Pain in left hip: Secondary | ICD-10-CM

## 2020-10-18 DIAGNOSIS — M5442 Lumbago with sciatica, left side: Secondary | ICD-10-CM

## 2020-10-18 DIAGNOSIS — M5441 Lumbago with sciatica, right side: Secondary | ICD-10-CM

## 2020-10-18 DIAGNOSIS — G8929 Other chronic pain: Secondary | ICD-10-CM

## 2020-10-18 MED ORDER — PREDNISONE 10 MG (21) PO TBPK: ORAL_TABLET | ORAL | 0 refills | Status: DC

## 2020-10-18 NOTE — Addendum Note (Signed)
Addended by: Wendi Maya on: 10/18/2020 09:26 AM   Modules accepted: Orders

## 2020-10-18 NOTE — Progress Notes (Signed)
Office Visit Note   Patient: Larry Williamson           Date of Birth: 03/30/1951           MRN: [id number] Visit Date: 10/18/2020              Requested by: Shelva Majestic, MD [address] [place],  [place] [address] PCP: Shelva Majestic, MD   Assessment & Plan: Visit Diagnoses:  1. Chronic bilateral low back pain with bilateral sciatica   2. Pain in left hip   3. Pain in right hip     Plan: Impression is lumbar radiculopathy and likely spinal stenosis.  I reviewed her previous MRI in 2015 which showed significant degenerative changes and I fear that this has only gotten worse with his symptoms having gotten worse.  Therefore we will need to order a new MRI of the lumbar spine.  In the meantime I will send in a prescription for prednisone Dosepak.  Follow-up after the MRI.  Follow-Up Instructions: Return if symptoms worsen or fail to improve.   Orders:  Orders Placed This Encounter  Procedures  . XR Lumbar Spine 2-3 Views  . XR HIP UNILAT W OR W/O PELVIS 2-3 VIEWS LEFT  . XR HIP UNILAT W OR W/O PELVIS 2-3 VIEWS RIGHT   Meds ordered this encounter  Medications  . predniSONE (STERAPRED UNI-PAK 21 TAB) 10 MG (21) TBPK tablet    Sig: Take as directed    Dispense:  21 tablet    Refill:  0      Procedures: No procedures performed   Clinical Data: No additional findings.   Subjective: Chief Complaint  Patient presents with  . Lower Back - Pain  . Left Hip - Pain  . Right Hip - Pain    Larry Williamson is a 69 year old gentleman comes in for severe bilateral thigh pain and burning down to the knees.  Denies any groin pain.  He has a constant numbness and tingling.  He is unable to walk or stand for more than a couple minutes.  He feels like it is getting much worse.  Denies any bowel or bladder complaints.   Review of Systems  Constitutional: Negative.   All other systems reviewed and are negative.    Objective: Vital Signs: There were no vitals taken for  this visit.  Physical Exam Vitals and nursing note reviewed.  Constitutional:      Appearance: He is well-developed and well-nourished.  HENT:     Head: Normocephalic and atraumatic.  Eyes:     Pupils: Pupils are equal, round, and reactive to light.  Pulmonary:     Effort: Pulmonary effort is normal.  Abdominal:     Palpations: Abdomen is soft.  Musculoskeletal:        General: Normal range of motion.     Cervical back: Neck supple.  Skin:    General: Skin is warm.  Neurological:     Mental Status: He is alert and oriented to person, place, and time.  Psychiatric:        Mood and Affect: Mood and affect normal.        Behavior: Behavior normal.        Thought Content: Thought content normal.        Judgment: Judgment normal.     Ortho Exam Bilateral hip exams are unremarkable.  Good range of motion without pain. Bilateral lower extremities showed no focal motor or sensory deficits.  Lumbar  spine is tender to palpation.  Decreased range of motion lumbar spine. Specialty Comments:  No specialty comments available.  Imaging: XR HIP UNILAT W OR W/O PELVIS 2-3 VIEWS LEFT  Result Date: 10/18/2020 Mild left hip OA.  XR HIP UNILAT W OR W/O PELVIS 2-3 VIEWS RIGHT  Result Date: 10/18/2020 Mild right hip OA.  XR Lumbar Spine 2-3 Views  Result Date: 10/18/2020 Significant lumbar spondylosis and degenerative disc disease.  No acute abnormalities.    PMFS History: Patient Active Problem List   Diagnosis Date Noted  . Myalgia due to statin 07/06/2020  . B12 deficiency 12/03/2019  . Dupuytren's contracture of right hand 10/28/2019  . Hyperglycemia 10/01/2016  . AAA (abdominal aortic aneurysm) ([facility]) 01/18/2016  . Meralgia paresthetica 12/28/2014  . History of skin cancer 12/28/2014  . Former smoker 12/28/2014  . Abnormal liver enzymes 12/29/2010  . Allergic rhinitis 06/17/2008  . ERECTILE DYSFUNCTION 12/07/2007  . History of colonic polyps 12/07/2007  . Umbilical  hernia 05/28/2007  . Hyperlipidemia 05/15/2007  . Essential hypertension 05/15/2007  . GERD 05/15/2007   Past Medical History:  Diagnosis Date  . Adenomatous colon polyp 06/2006  . ALLERGIC RHINITIS   . Allergy   . Anemia   . ANEMIA DUE TO DIETARY IRON DEFICIENCY 05/28/2007   Due to giving regularly giving blood. Resolved with cutting in half.     . COMPRESSION FRACTURE, THORACIC VERTEBRA 03/09/2008   2009, no chronic pain   . Diverticulitis of colon   . DIVERTICULOSIS, COLON 05/28/2007   Qualifier: Diagnosis of  By: Lovell Sheehan MD, Balinda Quails   . GERD (gastroesophageal reflux disease)   . Hyperlipidemia   . Hypertension   . Melanoma ([facility]) 02/23/2014   MM in situe, early, low mid back, exc  . Squamous cell carcinoma in situ 11/10/2018   Top of right shoulder, CX3, 5FU  . Squamous cell carcinoma of skin    SCC IN SITU TOP OF RIGHT SHOULDER TX CX3 5FU  . Ulcer 1992   gastric    Family History  Problem Relation Age of Onset  . Cancer Father        lung, former smoker, [place] lung in [place]  . Dementia Mother   . Hemochromatosis Brother   . Colon cancer Neg Hx   . Esophageal cancer Neg Hx   . Rectal cancer Neg Hx   . Stomach cancer Neg Hx   . Colon polyps Neg Hx     Past Surgical History:  Procedure Laterality Date  . COLONOSCOPY  01/2017   hx polyps/tics/hems  . COLONOSCOPY W/ POLYPECTOMY    . FOOT SURGERY Right    pain scraper several inches into foot  . POLYPECTOMY     Social History   Occupational History  . Not on file  Tobacco Use  . Smoking status: Former Smoker    Packs/day: 1.00    Years: 15.00    Pack years: 15.00    Types: Cigarettes    Quit date: 10/21/1978    Years since quitting: 42.0  . Smokeless tobacco: Never Used  Vaping Use  . Vaping Use: Never used  Substance and Sexual Activity  . Alcohol use: Yes    Alcohol/week: 0.0 standard drinks    Comment: 2 drinks /week   . Drug use: No  . Sexual activity: Yes

## 2020-10-25 ENCOUNTER — Encounter: Payer: Self-pay | Admitting: Family Medicine

## 2020-11-03 ENCOUNTER — Ambulatory Visit: Payer: Medicare Other | Admitting: Family Medicine

## 2020-11-08 ENCOUNTER — Ambulatory Visit
Admission: RE | Admit: 2020-11-08 | Discharge: 2020-11-08 | Disposition: A | Discharge status: Home / Self Care | Payer: Medicare Other | Source: Ambulatory Visit | Attending: Orthopaedic Surgery | Admitting: Orthopaedic Surgery

## 2020-11-08 ENCOUNTER — Other Ambulatory Visit: Payer: Self-pay

## 2020-11-08 DIAGNOSIS — G8929 Other chronic pain: Secondary | ICD-10-CM

## 2020-11-08 DIAGNOSIS — M5442 Lumbago with sciatica, left side: Secondary | ICD-10-CM

## 2020-11-08 DIAGNOSIS — M545 Low back pain, unspecified: Secondary | ICD-10-CM | POA: Diagnosis not present

## 2020-11-08 DIAGNOSIS — M48061 Spinal stenosis, lumbar region without neurogenic claudication: Secondary | ICD-10-CM | POA: Diagnosis not present

## 2020-11-09 ENCOUNTER — Ambulatory Visit (INDEPENDENT_AMBULATORY_CARE_PROVIDER_SITE_OTHER): Payer: Medicare Other

## 2020-11-09 DIAGNOSIS — E538 Deficiency of other specified B group vitamins: Secondary | ICD-10-CM | POA: Diagnosis not present

## 2020-11-09 MED ORDER — CYANOCOBALAMIN 1000 MCG/ML IJ SOLN
1000.0000 ug | Freq: Once | INTRAMUSCULAR | Status: AC
Start: 2020-11-09 — End: 2020-11-09
  Administered 2020-11-09: 1000 ug via INTRAMUSCULAR

## 2020-11-09 NOTE — Progress Notes (Signed)
Larry Williamson 70 yr old male presents to office today for monthly B12 injection per Garret Reddish, MD. Administered CYANOCOBALAMIN 1,000 mcg IM left arm. Patient tolerated well. Aware to return next month for next injection.

## 2020-11-09 NOTE — Progress Notes (Signed)
I have reviewed and agree with note, evaluation, plan.   Apollo Timothy, MD  

## 2020-11-10 ENCOUNTER — Ambulatory Visit: Payer: Medicare Other | Admitting: Orthopaedic Surgery

## 2020-11-16 ENCOUNTER — Ambulatory Visit: Payer: Medicare Other | Admitting: Orthopaedic Surgery

## 2020-11-16 ENCOUNTER — Encounter: Payer: Self-pay | Admitting: Orthopaedic Surgery

## 2020-11-16 ENCOUNTER — Other Ambulatory Visit: Payer: Self-pay

## 2020-11-16 DIAGNOSIS — M5416 Radiculopathy, lumbar region: Secondary | ICD-10-CM

## 2020-11-16 NOTE — Progress Notes (Signed)
Office Visit Note   Patient: Larry Williamson           Date of Birth: 1951-01-17           MRN: [id number] Visit Date: 11/16/2020              Requested by: Marin Olp, MD [place],  [place] [address] PCP: Marin Olp, MD   Assessment & Plan: Visit Diagnoses:  1. Lumbar radiculopathy     Plan: MRI shows multifactorial stenosis on the right side at L4-5 mainly and some L5-S1.  These findings correlate with his clinical symptoms.  He has had a good relief from prior epidural steroid injections.  He would like to get another round of injections if possible.  We will put in the referral.  We will see him back as needed.  Follow-Up Instructions: Return if symptoms worsen or fail to improve.   Orders:  No orders of the defined types were placed in this encounter.  No orders of the defined types were placed in this encounter.     Procedures: No procedures performed   Clinical Data: No additional findings.   Subjective: Chief Complaint  Patient presents with  . Lower Back - Pain    [name] returns today for MRI review of the lumbar spine.  Overall he feels okay.  He denies any significant back pain but mainly just right leg pain.   Review of Systems  Constitutional: Negative.   All other systems reviewed and are negative.    Objective: Vital Signs: There were no vitals taken for this visit.  Physical Exam Vitals and nursing note reviewed.  Constitutional:      Appearance: He is well-developed and well-nourished.  Pulmonary:     Effort: Pulmonary effort is normal.  Abdominal:     Palpations: Abdomen is soft.  Skin:    General: Skin is warm.  Neurological:     Mental Status: He is alert and oriented to person, place, and time.  Psychiatric:        Mood and Affect: Mood and affect normal.        Behavior: Behavior normal.        Thought Content: Thought content normal.        Judgment: Judgment normal.     Ortho  Exam Right lower extremity exam is unchanged.  No focal weakness. Specialty Comments:  No specialty comments available.  Imaging: No results found.   PMFS History: Patient Active Problem List   Diagnosis Date Noted  . Myalgia due to statin 07/06/2020  . B12 deficiency 12/03/2019  . Dupuytren's contracture of right hand 10/28/2019  . Hyperglycemia 10/01/2016  . AAA (abdominal aortic aneurysm) ([facility]) 01/18/2016  . Meralgia paresthetica 12/28/2014  . History of skin cancer 12/28/2014  . Former smoker 12/28/2014  . Abnormal liver enzymes 12/29/2010  . Allergic rhinitis 06/17/2008  . ERECTILE DYSFUNCTION 12/07/2007  . History of colonic polyps 12/07/2007  . Umbilical hernia 16/10/930  . Hyperlipidemia 05/15/2007  . Essential hypertension 05/15/2007  . GERD 05/15/2007   Past Medical History:  Diagnosis Date  . Adenomatous colon polyp 06/2006  . ALLERGIC RHINITIS   . Allergy   . Anemia   . ANEMIA DUE TO DIETARY IRON DEFICIENCY 05/28/2007   Due to giving regularly giving blood. Resolved with cutting in half.     . COMPRESSION FRACTURE, THORACIC VERTEBRA 03/09/2008   2009, no chronic pain   . Diverticulitis of colon   .  DIVERTICULOSIS, COLON 05/28/2007   Qualifier: Diagnosis of  By: Arnoldo Morale MD, Balinda Quails   . GERD (gastroesophageal reflux disease)   . Hyperlipidemia   . Hypertension   . Melanoma ([facility]) 02/23/2014   MM in situe, early, low mid back, exc  . Squamous cell carcinoma in situ 11/10/2018   Top of right shoulder, CX3, 5FU  . Squamous cell carcinoma of skin    SCC IN SITU TOP OF RIGHT SHOULDER TX CX3 5FU  . Ulcer 1992   gastric    Family History  Problem Relation Age of Onset  . Cancer Father        lung, former smoker, [place] lung in [place]  . Dementia Mother   . Hemochromatosis Brother   . Colon cancer Neg Hx   . Esophageal cancer Neg Hx   . Rectal cancer Neg Hx   . Stomach cancer Neg Hx   . Colon polyps Neg Hx     Past Surgical History:  Procedure Laterality  Date  . COLONOSCOPY  01/2017   hx polyps/tics/hems  . COLONOSCOPY W/ POLYPECTOMY    . FOOT SURGERY Right    pain scraper several inches into foot  . POLYPECTOMY     Social History   Occupational History  . Not on file  Tobacco Use  . Smoking status: Former Smoker    Packs/day: 1.00    Years: 15.00    Pack years: 15.00    Types: Cigarettes    Quit date: 10/21/1978    Years since quitting: 42.1  . Smokeless tobacco: Never Used  Vaping Use  . Vaping Use: Never used  Substance and Sexual Activity  . Alcohol use: Yes    Alcohol/week: 0.0 standard drinks    Comment: 2 drinks /week   . Drug use: No  . Sexual activity: Yes

## 2020-11-23 ENCOUNTER — Telehealth: Payer: Self-pay

## 2020-11-23 NOTE — Chronic Care Management (AMB) (Signed)
Chronic Care Management Pharmacy Assistant   Name: Larry Williamson  MRN: [id number] DOB: [birth year]  Reason for Encounter: Disease State/ Hypertension Adherence Call  Patient Questions:  1.  Have you seen any other providers since your last visit? Yes, 10/18/2020 [facility], Leandrew Koyanagi, MD Chronic bilateral low back pain with bilateral sciatica, 11/16/2020 [facility], Leandrew Koyanagi, MD Lumbar radiculopathy  2.  Any changes in your medicines or health? No   PCP : Marin Olp, MD  Allergies:  No Known Allergies  Medications: Outpatient Encounter Medications as of 11/23/2020  Medication Sig  . amLODipine (NORVASC) 2.5 MG tablet TAKE 1 TABLET(2.5 MG) BY MOUTH DAILY  . aspirin 81 MG tablet Take 81 mg by mouth daily.    . cetirizine (ZYRTEC) 10 MG tablet Take 10 mg by mouth daily.  . Cyanocobalamin (B-12) 1000 MCG CAPS Take 1,000 mcg by mouth daily.  [name] gabapentin (NEURONTIN) 100 MG capsule TAKE 1 TO 2 CAPSULES(100 TO 200 MG) BY MOUTH DAILY  . irbesartan-hydrochlorothiazide (AVALIDE) 300-12.5 MG tablet TAKE 1 TABLET BY MOUTH DAILY  . MULTIPLE VITAMIN PO Take by mouth daily.    . Omega-3 Fatty Acids (FISH OIL) 1000 MG CAPS Take 2 capsules by mouth daily.   [name] omeprazole (PRILOSEC) 40 MG capsule TAKE 1 CAPSULE BY MOUTH EVERY DAY  . predniSONE (STERAPRED UNI-PAK 21 TAB) 10 MG (21) TBPK tablet Take as directed  . rosuvastatin (CRESTOR) 5 MG tablet TAKE 1 TABLET BY MOUTH once a week  . sildenafil (VIAGRA) 100 MG tablet Take 1 tablet (100 mg total) by mouth daily as needed for erectile dysfunction.  . tizanidine (ZANAFLEX) 2 MG capsule TAKE 1 CAPSULE(2 MG) BY MOUTH TWICE DAILY AS NEEDED FOR MUSCLE SPASMS   No facility-administered encounter medications on file as of 11/23/2020.    Current Diagnosis: Patient Active Problem List   Diagnosis Date Noted  . Myalgia due to statin 07/06/2020  . B12 deficiency 12/03/2019  . Dupuytren's contracture of right hand 10/28/2019   . Hyperglycemia 10/01/2016  . AAA (abdominal aortic aneurysm) ([facility]) 01/18/2016  . Meralgia paresthetica 12/28/2014  . History of skin cancer 12/28/2014  . Former smoker 12/28/2014  . Abnormal liver enzymes 12/29/2010  . Allergic rhinitis 06/17/2008  . ERECTILE DYSFUNCTION 12/07/2007  . History of colonic polyps 12/07/2007  . Umbilical hernia 01/60/1093  . Hyperlipidemia 05/15/2007  . Essential hypertension 05/15/2007  . GERD 05/15/2007    Reviewed chart prior to disease state call. Spoke with patient regarding BP  Recent Office Vitals: BP Readings from Last 3 Encounters:  07/06/20 130/78  01/03/20 132/78  01/03/20 132/78   Pulse Readings from Last 3 Encounters:  07/06/20 66  01/03/20 67  01/03/20 67    Wt Readings from Last 3 Encounters:  07/06/20 234 lb 6.4 oz (106.3 kg)  01/03/20 238 lb 12.1 oz (108.3 kg)  01/03/20 238 lb 12.8 oz (108.3 kg)     Kidney Function Lab Results  Component Value Date/Time   CREATININE 1.02 07/06/2020 08:49 AM   CREATININE 1.23 10/28/2019 08:38 AM   CREATININE 0.97 07/29/2019 08:37 AM   GFR 58.31 (L) 10/28/2019 08:38 AM   GFRNONAA 75 07/06/2020 08:49 AM   GFRAA 87 07/06/2020 08:49 AM    BMP Latest Ref Rng & Units 07/06/2020 10/28/2019 07/29/2019  Glucose 65 - 99 mg/dL 97 104(H) 98  BUN 7 - 25 mg/dL 20 23 16   Creatinine 0.70 - 1.25 mg/dL 1.02 1.23 0.97  BUN/Creat Ratio 6 -  22 (calc) NOT APPLICABLE - -  Sodium 818 - 146 mmol/L 135 137 135  Potassium 3.5 - 5.3 mmol/L 4.0 4.3 4.4  Chloride 98 - 110 mmol/L 101 102 102  CO2 20 - 32 mmol/L 22 24 24   Calcium 8.6 - 10.3 mg/dL 8.9 9.1 9.0    . Current antihypertensive regimen:  o amLODopine (NORVASC) 2.5 mg tablet o Irbesartan-hydrochlorothiazide (AVALIDE) 300-12.5 mg tablet  . How often are you checking your Blood Pressure? 1-2x per week   . Current home BP readings: 140-142/80's  . What recent interventions/DTPs have been made by any provider to improve Blood Pressure control since  last CPP Visit: Patient states he takes his medications as directed.  . Any recent hospitalizations or ED visits since last visit with CPP? No , patient has not had any recent hospitalizations or ED visits since his last visit with Madelin Rear, CPP.  [name] What diet changes have been made to improve Blood Pressure Control?  o Patient states he has been trying to increase his fruits and vegetables as advised. Patient states he doesn't eat a lot of red meat, he mostly eats chicken and pork.  . What exercise is being done to improve your Blood Pressure Control?  o Patient states he really would like to try silver sneakers. Patient states he is unable to exercise at this time due to "a pinched nerve in my back".  Adherence Review: Is the patient currently on ACE/ARB medication? Yes Does the patient have >5 day gap between last estimated fill dates? No  Patient states he has tried to come off of Omeprazole before and it didn't;t work out for him. Patient states if he doesn't take Omeprazole he will have terrible acid reflux even avoiding triggers. Patient states "I can try again in the future, just not right now."  April D Calhoun, [place] Pharmacist Assistant [phone]   Follow-Up:  Pharmacist Review

## 2020-11-28 ENCOUNTER — Encounter: Payer: Self-pay | Admitting: Gastroenterology

## 2020-12-05 ENCOUNTER — Encounter: Payer: Self-pay | Admitting: Physical Medicine and Rehabilitation

## 2020-12-05 ENCOUNTER — Ambulatory Visit: Payer: Self-pay

## 2020-12-05 ENCOUNTER — Encounter: Payer: Self-pay | Admitting: Gastroenterology

## 2020-12-05 ENCOUNTER — Other Ambulatory Visit: Payer: Self-pay

## 2020-12-05 ENCOUNTER — Ambulatory Visit (INDEPENDENT_AMBULATORY_CARE_PROVIDER_SITE_OTHER): Payer: Medicare Other | Admitting: Physical Medicine and Rehabilitation

## 2020-12-05 VITALS — BP 139/82 | HR 76

## 2020-12-05 DIAGNOSIS — M5416 Radiculopathy, lumbar region: Secondary | ICD-10-CM | POA: Diagnosis not present

## 2020-12-05 MED ORDER — BETAMETHASONE SOD PHOS & ACET 6 (3-3) MG/ML IJ SUSP
12.0000 mg | Freq: Once | INTRAMUSCULAR | Status: AC
  Administered 2020-12-05: 12 mg

## 2020-12-05 NOTE — Patient Instructions (Signed)

## 2020-12-05 NOTE — Procedures (Signed)
Lumbar Epidural Steroid Injection - Interlaminar Approach with Fluoroscopic Guidance  Patient: Larry Williamson      Date of Birth: 01/28/1951 MRN: [id number] PCP: Marin Olp, MD      Visit Date: 12/05/2020   Universal Protocol:     Consent Given By: the patient  Position: PRONE  Additional Comments: Vital signs were monitored before and after the procedure. Patient was prepped and draped in the usual sterile fashion. The correct patient, procedure, and site was verified.   Injection Procedure Details:   Procedure diagnoses: Lumbar radiculopathy [M54.16]   Meds Administered:  Meds ordered this encounter  Medications  . betamethasone acetate-betamethasone sodium phosphate (CELESTONE) injection 12 mg     Laterality: Right  Location/Site:  L4-L5  Needle: 3.5 in., 20 ga. Tuohy  Needle Placement: Paramedian epidural  Findings:   -Comments: Excellent flow of contrast into the epidural space.  Procedure Details: Using a paramedian approach from the side mentioned above, the region overlying the inferior lamina was localized under fluoroscopic visualization and the soft tissues overlying this structure were infiltrated with 4 ml. of 1% Lidocaine without Epinephrine. The Tuohy needle was inserted into the epidural space using a paramedian approach.   The epidural space was localized using loss of resistance along with counter oblique bi-planar fluoroscopic views.  After negative aspirate for air, blood, and CSF, a 2 ml. volume of Isovue-250 was injected into the epidural space and the flow of contrast was observed. Radiographs were obtained for documentation purposes.    The injectate was administered into the level noted above.   Additional Comments:  The patient tolerated the procedure well Dressing: 2 x 2 sterile gauze and Band-Aid    Post-procedure details: Patient was observed during the procedure. Post-procedure instructions were reviewed.  Patient  left the clinic in stable condition.

## 2020-12-05 NOTE — Progress Notes (Signed)
Larry Williamson - 70 y.o. male MRN [id number]  Date of birth: May 08, 1951  Office Visit Note: Visit Date: 12/05/2020 PCP: Marin Olp, MD Referred by: Marin Olp, MD  Subjective: Chief Complaint  Patient presents with  . Lower Back - Pain   HPI:  Larry Williamson is a 70 y.o. male who comes in today at the request of Dr. Eduard Roux for planned Right L4-L5 Lumbar epidural steroid injection with fluoroscopic guidance.  The patient has failed conservative care including home exercise, medications, time and activity modification.  This injection will be diagnostic and hopefully therapeutic.  Please see requesting physician notes for further details and justification.  MRI reviewed with images and spine model.  MRI reviewed in the note below.    ROS Otherwise per HPI.  Assessment & Plan: Visit Diagnoses:    ICD-10-CM   1. Lumbar radiculopathy  M54.16 XR C-ARM NO REPORT    Epidural Steroid injection    betamethasone acetate-betamethasone sodium phosphate (CELESTONE) injection 12 mg    Plan: No additional findings.   Meds & Orders:  Meds ordered this encounter  Medications  . betamethasone acetate-betamethasone sodium phosphate (CELESTONE) injection 12 mg    Orders Placed This Encounter  Procedures  . XR C-ARM NO REPORT  . Epidural Steroid injection    Follow-up: Return if symptoms worsen or fail to improve.   Procedures: No procedures performed  Lumbar Epidural Steroid Injection - Interlaminar Approach with Fluoroscopic Guidance  Patient: Larry Williamson      Date of Birth: 1950/12/26 MRN: [id number] PCP: Marin Olp, MD      Visit Date: 12/05/2020   Universal Protocol:     Consent Given By: the patient  Position: PRONE  Additional Comments: Vital signs were monitored before and after the procedure. Patient was prepped and draped in the usual sterile fashion. The correct patient, procedure, and site was verified.   Injection  Procedure Details:   Procedure diagnoses: Lumbar radiculopathy [M54.16]   Meds Administered:  Meds ordered this encounter  Medications  . betamethasone acetate-betamethasone sodium phosphate (CELESTONE) injection 12 mg     Laterality: Right  Location/Site:  L4-L5  Needle: 3.5 in., 20 ga. Tuohy  Needle Placement: Paramedian epidural  Findings:   -Comments: Excellent flow of contrast into the epidural space.  Procedure Details: Using a paramedian approach from the side mentioned above, the region overlying the inferior lamina was localized under fluoroscopic visualization and the soft tissues overlying this structure were infiltrated with 4 ml. of 1% Lidocaine without Epinephrine. The Tuohy needle was inserted into the epidural space using a paramedian approach.   The epidural space was localized using loss of resistance along with counter oblique bi-planar fluoroscopic views.  After negative aspirate for air, blood, and CSF, a 2 ml. volume of Isovue-250 was injected into the epidural space and the flow of contrast was observed. Radiographs were obtained for documentation purposes.    The injectate was administered into the level noted above.   Additional Comments:  The patient tolerated the procedure well Dressing: 2 x 2 sterile gauze and Band-Aid    Post-procedure details: Patient was observed during the procedure. Post-procedure instructions were reviewed.  Patient left the clinic in stable condition.     Clinical History: MRI LUMBAR SPINE WITHOUT CONTRAST  TECHNIQUE: Multiplanar, multisequence MR imaging of the lumbar spine was performed. No intravenous contrast was administered.  COMPARISON:  2015  FINDINGS: Segmentation:  Standard.  Alignment:  Preserved and stable.  Vertebrae: Stable vertebral body heights. There is no substantial marrow edema. There is no suspicious osseous lesion.  Conus medullaris and cauda equina: Conus extends to the L1  level. Conus and cauda equina appear normal.  Paraspinal and other soft tissues: Unremarkable.  Disc levels:  L1-L2:  Disc bulge.  No canal or foraminal stenosis.  L2-L3:  Disc bulge.  No canal stenosis.  Minor foraminal stenosis.  L3-L4:  Disc bulge.  No canal stenosis.  Minor foraminal stenosis.  L4-L5: Disc bulge with endplate osteophytic ridging and superimposed central protrusion. Facet arthropathy with ligamentum flavum infolding. Minor canal stenosis. Narrowing of the subarticular recesses. Increased moderate to marked right foraminal stenosis. Mild left foraminal stenosis.  L5-S1: Disc bulge with endplate osteophytic ridging. Facet arthropathy. No canal stenosis. Moderate foraminal stenosis, right greater than left.  IMPRESSION: Multilevel degenerative changes as detailed above. There is increased right foraminal stenosis at L4-L5. Otherwise no substantial progression since 2015.   Electronically Signed   By: Macy Mis M.D.   On: 11/08/2020 08:29     Objective:  VS:  HT:    WT:   BMI:     BP:139/82  HR:76bpm  TEMP: ( )  RESP:  Physical Exam Vitals and nursing note reviewed.  Constitutional:      General: He is not in acute distress.    Appearance: Normal appearance. He is obese. He is not ill-appearing.  HENT:     Head: Normocephalic and atraumatic.     Right Ear: External ear normal.     Left Ear: External ear normal.     Nose: No congestion.  Eyes:     Extraocular Movements: Extraocular movements intact.  Cardiovascular:     Rate and Rhythm: Normal rate.     Pulses: Normal pulses.  Pulmonary:     Effort: Pulmonary effort is normal. No respiratory distress.  Abdominal:     General: There is no distension.     Palpations: Abdomen is soft.  Musculoskeletal:        General: No tenderness or signs of injury.     Cervical back: Neck supple.     Right lower leg: No edema.     Left lower leg: No edema.     Comments: Patient has good  distal strength without clonus.  Skin:    Findings: No erythema or rash.  Neurological:     General: No focal deficit present.     Mental Status: He is alert and oriented to person, place, and time.     Sensory: No sensory deficit.     Motor: No weakness or abnormal muscle tone.     Coordination: Coordination normal.  Psychiatric:        Mood and Affect: Mood normal.        Behavior: Behavior normal.      Imaging: No results found.

## 2020-12-05 NOTE — Progress Notes (Signed)
 .  Numeric Pain Rating Scale and Functional Assessment Average Pain 8   In the last MONTH (on 0-10 scale) has pain interfered with the following?  1. General activity like being  able to carry out your everyday physical activities such as walking, climbing stairs, carrying groceries, or moving a chair?  Rating(6)   +Driver, -BT, -Dye Allergies.  

## 2020-12-06 ENCOUNTER — Other Ambulatory Visit: Payer: Self-pay | Admitting: Family Medicine

## 2020-12-14 ENCOUNTER — Ambulatory Visit: Payer: Medicare Other

## 2020-12-14 NOTE — Progress Notes (Deleted)
Per orders of Dr. Yong Channel, injection of Cyanocobalamin 1000 mcg/ml given by Anselmo Pickler, LPN in right deltoid. Patient tolerated injection well. Patient will make appointment for 1 month.

## 2020-12-29 ENCOUNTER — Telehealth: Payer: Self-pay

## 2020-12-29 NOTE — Telephone Encounter (Signed)
If more than 50% and still some better then for sure repeat, if great relief but short lived then ok repeat, if no help either OV or facet L4-5

## 2020-12-29 NOTE — Telephone Encounter (Signed)
Right L4-5 IL on 12/05/20. Ok to repeat if helped, same problem/side, and no new injury?

## 2020-12-29 NOTE — Telephone Encounter (Signed)
Patient reports that he had very little relief from injection- less than 50%. Scheduled for OV.

## 2020-12-29 NOTE — Telephone Encounter (Signed)
Pt called and would like to schedule another ESI. Please call pt back [phone]

## 2021-01-11 ENCOUNTER — Ambulatory Visit (INDEPENDENT_AMBULATORY_CARE_PROVIDER_SITE_OTHER): Payer: Medicare Other

## 2021-01-11 ENCOUNTER — Other Ambulatory Visit: Payer: Self-pay

## 2021-01-11 DIAGNOSIS — E538 Deficiency of other specified B group vitamins: Secondary | ICD-10-CM

## 2021-01-11 MED ORDER — CYANOCOBALAMIN 1000 MCG/ML IJ SOLN
1000.0000 ug | Freq: Once | INTRAMUSCULAR | Status: AC
Start: 2021-01-11 — End: 2021-01-11
  Administered 2021-01-11: 1000 ug via INTRAMUSCULAR

## 2021-01-11 NOTE — Progress Notes (Signed)
Larry Williamson 70 year old male, presents in office today for B12 injection per Francis Dowse. Administered CYANOCOBALAMIN 1,062mcg IM right deltoid. Patient tolerated injection well, is aware to return next month for next injection.

## 2021-01-11 NOTE — Progress Notes (Signed)
I have reviewed and agree with note, evaluation, plan.   Creighton Longley, MD  

## 2021-01-16 ENCOUNTER — Other Ambulatory Visit: Payer: Self-pay

## 2021-01-16 ENCOUNTER — Ambulatory Visit (AMBULATORY_SURGERY_CENTER): Payer: Self-pay

## 2021-01-16 VITALS — Ht 69.0 in | Wt 238.0 lb

## 2021-01-16 DIAGNOSIS — Z86010 Personal history of colonic polyps: Secondary | ICD-10-CM

## 2021-01-16 MED ORDER — GOLYTELY 236 G PO SOLR: 4000.0000 mL | ORAL | 0 refills | Status: DC

## 2021-01-16 NOTE — Progress Notes (Signed)
No egg or soy allergy known to patient  No issues with past sedation with any surgeries or procedures Patient denies ever being told they had issues or difficulty with intubation  No FH of Malignant Hyperthermia No diet pills per patient No home 02 use per patient  No blood thinners per patient  Pt denies issues with constipation  No A fib or A flutter  EMMI video via MyChart  COVID 19 guidelines implemented in PV today with Pt and RN  NO PA's for preps discussed with pt in PV today  Discussed with pt there will be an out-of-pocket cost for prep and that varies from $0 to 70 dollars  Due to the COVID-19 pandemic we are asking patients to follow certain guidelines.  Pt aware of COVID protocols and [facility] guidelines

## 2021-01-23 ENCOUNTER — Other Ambulatory Visit: Payer: Self-pay

## 2021-01-23 ENCOUNTER — Ambulatory Visit: Payer: Medicare Other | Admitting: Physical Medicine and Rehabilitation

## 2021-01-23 ENCOUNTER — Encounter: Payer: Self-pay | Admitting: Physical Medicine and Rehabilitation

## 2021-01-23 ENCOUNTER — Telehealth: Payer: Self-pay | Admitting: Physical Medicine and Rehabilitation

## 2021-01-23 VITALS — BP 135/78 | HR 76

## 2021-01-23 DIAGNOSIS — M47816 Spondylosis without myelopathy or radiculopathy, lumbar region: Secondary | ICD-10-CM | POA: Diagnosis not present

## 2021-01-23 DIAGNOSIS — M5416 Radiculopathy, lumbar region: Secondary | ICD-10-CM | POA: Diagnosis not present

## 2021-01-23 DIAGNOSIS — M48061 Spinal stenosis, lumbar region without neurogenic claudication: Secondary | ICD-10-CM

## 2021-01-23 MED ORDER — GABAPENTIN 300 MG PO CAPS: 300.0000 mg | ORAL_CAPSULE | Freq: Two times a day (BID) | ORAL | 1 refills | Status: DC

## 2021-01-23 NOTE — Telephone Encounter (Signed)
Is auth needed for right L4 TF? Scheduled for 4/7 with driver.

## 2021-01-23 NOTE — Progress Notes (Signed)
ESI in February "helped a little." 30-40% relief. Not using as much Icy Hot on leg as before. Pain in both thighs to knees. Right worse than left. Numeric Pain Rating Scale and Functional Assessment Average Pain 5 Pain Right Now 5 My pain is constant, dull and aching Pain is worse with: walking and standing Pain improves with: rest   In the last MONTH (on 0-10 scale) has pain interfered with the following?  1. General activity like being  able to carry out your everyday physical activities such as walking, climbing stairs, carrying groceries, or moving a chair?  Rating(6)  2. Relation with others like being able to carry out your usual social activities and roles such as  activities at home, at work and in your community. Rating(6)  3. Enjoyment of life such that you have  been bothered by emotional problems such as feeling anxious, depressed or irritable?  Rating(6)

## 2021-01-23 NOTE — Telephone Encounter (Signed)
Pt was approve.

## 2021-01-23 NOTE — Progress Notes (Signed)
Larry Williamson - 70 y.o. male MRN [id number]  Date of birth: [birth year]  Office Visit Note: Visit Date: 01/23/2021 PCP: Marin Olp, MD Referred by: Marin Olp, MD  Subjective: Chief Complaint  Patient presents with  . Lower Back - Pain   HPI: Larry Williamson is a 70 y.o. male who comes in today For evaluation and management of chronic worsening low back pain with right hip and thigh pain down to the knee and a little bit past the knee.  Sharp shooting pains into the right thigh have subsided to a big degree and is not using as much IcyHot.  He continues with gabapentin 200 mg in the morning.  This is provided by his primary care physician Dr. Yong Channel.  We completed a right L4-5 interlaminar injection in February and at first he says he got 30 to 40% relief but really he tells me he went from a 10 to a 5 and really more than 50% relief.  This seems to prove that is probably not meralgia paresthetica which she had been diagnosed with in the past at least on the side.  He did get quite a bit of relief from the epidural injection.  He reports his wife saw him not using as much IcyHot as he was using before.  He rates his pain as a 5 out of 10 constant dull and aching with some numbness.  Worse with standing and walking better at rest.  No real left-sided complaints.  No specific low back pain that is worrisome for him he has a history of chronic low back pain.  No focal weakness no trauma since we have last seen him.  Again, he did get some relief with the injection more than 50% and he did decrease medication usage and became more functional overall.  Review of Systems  Musculoskeletal: Positive for back pain and joint pain.  Neurological: Positive for tingling.  All other systems reviewed and are negative.  Otherwise per HPI.  Assessment & Plan: Visit Diagnoses:    ICD-10-CM   1. Lumbar radiculopathy  M54.16   2. Bilateral stenosis of lateral recess of lumbar spine   M48.061   3. Foraminal stenosis of lumbar region  M48.061   4. Spondylosis without myelopathy or radiculopathy, lumbar region  M47.816      Plan: Findings:  Chronic worsening severe right hip and thigh pain consistent more with an L4 radicular symptom just to the knee area but down the leg with burning paresthesia and sharp throbbing shooting type pain.  Decreased in the amount of sharp shooting pain but still having significant symptoms 5 out of 10 now.  He has shown improvement with epidural injection proving that the lateral recess narrowing is likely the cause along with probably some foraminal narrowing at this level.  Neck step is a right L4 diagnostic and hopefully therapeutic transforaminal epidural steroid injection to see if that does better.  If he does well from a pain standpoint but still has paresthesia and I think the gabapentin should help him.  We will increase his dosing to 300 mg twice daily.  He has been taking the 200 mg in the morning and says it does not make him sleepy at all.  I have asked him to take the 300 mg at night for a few nights and then he can take it twice a day.  I did rewrite the prescription for him.    Meds & Orders:  Meds ordered  this encounter  Medications  . gabapentin (NEURONTIN) 300 MG capsule    Sig: Take 1 capsule (300 mg total) by mouth 2 (two) times daily.    Dispense:  120 capsule    Refill:  1   No orders of the defined types were placed in this encounter.   Follow-up: Return for Right L4 transforaminal epidural steroid injection.   Procedures: No procedures performed      Clinical History: MRI LUMBAR SPINE WITHOUT CONTRAST  TECHNIQUE: Multiplanar, multisequence MR imaging of the lumbar spine was performed. No intravenous contrast was administered.  COMPARISON:  2015  FINDINGS: Segmentation:  Standard.  Alignment:  Preserved and stable.  Vertebrae: Stable vertebral body heights. There is no substantial marrow edema. There  is no suspicious osseous lesion.  Conus medullaris and cauda equina: Conus extends to the L1 level. Conus and cauda equina appear normal.  Paraspinal and other soft tissues: Unremarkable.  Disc levels:  L1-L2:  Disc bulge.  No canal or foraminal stenosis.  L2-L3:  Disc bulge.  No canal stenosis.  Minor foraminal stenosis.  L3-L4:  Disc bulge.  No canal stenosis.  Minor foraminal stenosis.  L4-L5: Disc bulge with endplate osteophytic ridging and superimposed central protrusion. Facet arthropathy with ligamentum flavum infolding. Minor canal stenosis. Narrowing of the subarticular recesses. Increased moderate to marked right foraminal stenosis. Mild left foraminal stenosis.  L5-S1: Disc bulge with endplate osteophytic ridging. Facet arthropathy. No canal stenosis. Moderate foraminal stenosis, right greater than left.  IMPRESSION: Multilevel degenerative changes as detailed above. There is increased right foraminal stenosis at L4-L5. Otherwise no substantial progression since 2015.   Electronically Signed   By: Macy Mis M.D.   On: 11/08/2020 08:29   He reports that he quit smoking about 42 years ago. His smoking use included cigarettes. He has a 15.00 pack-year smoking history. He has never used smokeless tobacco. No results for input(s): HGBA1C, LABURIC in the last 8760 hours.  Objective:  VS:  HT:    WT:   BMI:     BP:135/78  HR:76bpm  TEMP: ( )  RESP:  Physical Exam Vitals and nursing note reviewed.  Constitutional:      General: He is not in acute distress.    Appearance: Normal appearance. He is not ill-appearing.  HENT:     Head: Normocephalic and atraumatic.     Right Ear: External ear normal.     Left Ear: External ear normal.     Nose: No congestion.  Eyes:     Extraocular Movements: Extraocular movements intact.  Cardiovascular:     Rate and Rhythm: Normal rate.     Pulses: Normal pulses.  Pulmonary:     Effort: Pulmonary effort is  normal. No respiratory distress.  Abdominal:     General: There is no distension.     Palpations: Abdomen is soft.  Musculoskeletal:        General: No tenderness or signs of injury.     Cervical back: Neck supple.     Right lower leg: No edema.     Left lower leg: No edema.     Comments: Patient has good distal strength without clonus.  Skin:    Findings: No erythema or rash.  Neurological:     General: No focal deficit present.     Mental Status: He is alert and oriented to person, place, and time.     Sensory: No sensory deficit.     Motor: No weakness or abnormal muscle tone.  Coordination: Coordination normal.  Psychiatric:        Mood and Affect: Mood normal.        Behavior: Behavior normal.     Ortho Exam  Imaging: No results found.  Past Medical/Family/Surgical/Social History: Medications & Allergies reviewed per EMR, new medications updated. Patient Active Problem List   Diagnosis Date Noted  . Myalgia due to statin 07/06/2020  . B12 deficiency 12/03/2019  . Dupuytren's contracture of right hand 10/28/2019  . Hyperglycemia 10/01/2016  . AAA (abdominal aortic aneurysm) ([place]) 01/18/2016  . Meralgia paresthetica 12/28/2014  . History of skin cancer 12/28/2014  . Former smoker 12/28/2014  . Abnormal liver enzymes 12/29/2010  . Allergic rhinitis 06/17/2008  . ERECTILE DYSFUNCTION 12/07/2007  . History of colonic polyps 12/07/2007  . Umbilical hernia 88/50/2774  . Hyperlipidemia 05/15/2007  . Essential hypertension 05/15/2007  . GERD 05/15/2007   Past Medical History:  Diagnosis Date  . Adenomatous colon polyp 06/2006  . ALLERGIC RHINITIS   . Allergy    seasonal allergies  . Anemia    hx of  . ANEMIA DUE TO DIETARY IRON DEFICIENCY 05/28/2007   Due to giving regularly giving blood. Resolved with cutting in half.     . COMPRESSION FRACTURE, THORACIC VERTEBRA 03/09/2008   2009, no chronic pain   . Diverticulitis of colon   . DIVERTICULOSIS, COLON 05/28/2007    Qualifier: Diagnosis of  By: Arnoldo Morale MD, Balinda Quails   . GERD (gastroesophageal reflux disease)    on meds  . Hyperlipidemia    on meds  . Hypertension    on meds  . Melanoma ([place]) 02/23/2014   MM in situe, early, low mid back, exc  . Squamous cell carcinoma in situ 11/10/2018   Top of right shoulder, CX3, 5FU  . Squamous cell carcinoma of skin    SCC IN SITU TOP OF RIGHT SHOULDER TX CX3 5FU  . Ulcer 1992   gastric   Family History  Problem Relation Age of Onset  . Cancer Father        lung, former smoker, [place] lung in [place]  . Dementia Mother   . Hemochromatosis Brother   . Colon cancer Neg Hx   . Esophageal cancer Neg Hx   . Rectal cancer Neg Hx   . Stomach cancer Neg Hx   . Colon polyps Neg Hx    Past Surgical History:  Procedure Laterality Date  . COLONOSCOPY  01/2017   hx polyps/tics/hems  . COLONOSCOPY  2019   [facility]-TICS/int hems/3 yr recall-  . COLONOSCOPY W/ POLYPECTOMY    . FOOT SURGERY Right    pain scraper several inches into foot  . POLYPECTOMY  2018   piecemeal polyps TAx 2  . WISDOM TOOTH EXTRACTION     Social History   Occupational History  . Not on file  Tobacco Use  . Smoking status: Former Smoker    Packs/day: 1.00    Years: 15.00    Pack years: 15.00    Types: Cigarettes    Quit date: 10/21/1978    Years since quitting: 42.2  . Smokeless tobacco: Never Used  Vaping Use  . Vaping Use: Never used  Substance and Sexual Activity  . Alcohol use: Yes    Alcohol/week: 14.0 standard drinks    Types: 14 Standard drinks or equivalent per week    Comment: 2 per day  . Drug use: No  . Sexual activity: Yes

## 2021-01-25 ENCOUNTER — Ambulatory Visit (INDEPENDENT_AMBULATORY_CARE_PROVIDER_SITE_OTHER): Payer: Medicare Other | Admitting: Physical Medicine and Rehabilitation

## 2021-01-25 ENCOUNTER — Encounter: Payer: Self-pay | Admitting: Gastroenterology

## 2021-01-25 ENCOUNTER — Other Ambulatory Visit: Payer: Self-pay

## 2021-01-25 ENCOUNTER — Ambulatory Visit: Payer: Self-pay

## 2021-01-25 ENCOUNTER — Encounter: Payer: Self-pay | Admitting: Physical Medicine and Rehabilitation

## 2021-01-25 VITALS — BP 142/85 | HR 76

## 2021-01-25 DIAGNOSIS — M5416 Radiculopathy, lumbar region: Secondary | ICD-10-CM | POA: Diagnosis not present

## 2021-01-25 MED ORDER — METHYLPREDNISOLONE ACETATE 80 MG/ML IJ SUSP
40.0000 mg | Freq: Once | INTRAMUSCULAR | Status: AC
Start: 2021-01-25 — End: 2021-01-25
  Administered 2021-01-25: 40 mg

## 2021-01-25 NOTE — Progress Notes (Signed)
Pt state lower back pain that travels to his right thigh. Pt state walking and standing makes the pain worse. Pt state he take pain meds to help ease his pain. Pt has hx of inj on 12/05/20 pt state it helped.  Numeric Pain Rating Scale and Functional Assessment Average Pain 7   In the last MONTH (on 0-10 scale) has pain interfered with the following?  1. General activity like being  able to carry out your everyday physical activities such as walking, climbing stairs, carrying groceries, or moving a chair?  Rating(6)   +Driver, -BT, -Dye Allergies.

## 2021-01-25 NOTE — Procedures (Signed)
Lumbosacral Transforaminal Epidural Steroid Injection - Sub-Pedicular Approach with Fluoroscopic Guidance  Patient: Larry Williamson      Date of Birth: 18-Aug-1951 MRN: [id number] PCP: Marin Olp, MD      Visit Date: 01/25/2021   Universal Protocol:    Date/Time: 01/25/2021  Consent Given By: the patient  Position: PRONE  Additional Comments: Vital signs were monitored before and after the procedure. Patient was prepped and draped in the usual sterile fashion. The correct patient, procedure, and site was verified.   Injection Procedure Details:   Procedure diagnoses: Lumbar radiculopathy [M54.16]    Meds Administered:  Meds ordered this encounter  Medications  . methylPREDNISolone acetate (DEPO-MEDROL) injection 40 mg    Laterality: Right  Location/Site:  L4-L5  Needle:5.0 in., 22 ga.  Short bevel or Quincke spinal needle  Needle Placement: Transforaminal  Findings:    -Comments: Excellent flow of contrast along the nerve, nerve root and into the epidural space.  Procedure Details: After squaring off the end-plates to get a true AP view, the C-arm was positioned so that an oblique view of the foramen as noted above was visualized. The target area is just inferior to the "nose of the scotty dog" or sub pedicular. The soft tissues overlying this structure were infiltrated with 2-3 ml. of 1% Lidocaine without Epinephrine.  The spinal needle was inserted toward the target using a "trajectory" view along the fluoroscope beam.  Under AP and lateral visualization, the needle was advanced so it did not puncture dura and was located close the 6 O'Clock position of the pedical in AP tracterory. Biplanar projections were used to confirm position. Aspiration was confirmed to be negative for CSF and/or blood. A 1-2 ml. volume of Isovue-250 was injected and flow of contrast was noted at each level. Radiographs were obtained for documentation purposes.   After attaining  the desired flow of contrast documented above, a 0.5 to 1.0 ml test dose of 0.25% Marcaine was injected into each respective transforaminal space.  The patient was observed for 90 seconds post injection.  After no sensory deficits were reported, and normal lower extremity motor function was noted,   the above injectate was administered so that equal amounts of the injectate were placed at each foramen (level) into the transforaminal epidural space.   Additional Comments:  The patient tolerated the procedure well Dressing: 2 x 2 sterile gauze and Band-Aid    Post-procedure details: Patient was observed during the procedure. Post-procedure instructions were reviewed.  Patient left the clinic in stable condition.

## 2021-01-25 NOTE — Patient Instructions (Signed)

## 2021-01-25 NOTE — Progress Notes (Signed)
Larry Williamson - 70 y.o. male MRN [id number]  Date of birth: May 05, 1951  Office Visit Note: Visit Date: 01/25/2021 PCP: Marin Olp, MD Referred by: Marin Olp, MD  Subjective: Chief Complaint  Patient presents with  . Lower Back - Pain  . Right Thigh - Pain   HPI:  Larry Williamson is a 70 y.o. male who comes in today for planned Right L4-L5 Lumbar epidural steroid injection with fluoroscopic guidance.  The patient has failed conservative care including home exercise, medications, time and activity modification.  This injection will be diagnostic and hopefully therapeutic.  Please see requesting physician notes for further details and justification.   ROS Otherwise per HPI.  Assessment & Plan: Visit Diagnoses:    ICD-10-CM   1. Lumbar radiculopathy  M54.16 XR C-ARM NO REPORT    Epidural Steroid injection    methylPREDNISolone acetate (DEPO-MEDROL) injection 40 mg    Plan: No additional findings.   Meds & Orders:  Meds ordered this encounter  Medications  . methylPREDNISolone acetate (DEPO-MEDROL) injection 40 mg    Orders Placed This Encounter  Procedures  . XR C-ARM NO REPORT  . Epidural Steroid injection    Follow-up: Return if symptoms worsen or fail to improve.   Procedures: No procedures performed  Lumbosacral Transforaminal Epidural Steroid Injection - Sub-Pedicular Approach with Fluoroscopic Guidance  Patient: Larry Williamson      Date of Birth: 23-Mar-1951 MRN: [id number] PCP: Marin Olp, MD      Visit Date: 01/25/2021   Universal Protocol:    Date/Time: 01/25/2021  Consent Given By: the patient  Position: PRONE  Additional Comments: Vital signs were monitored before and after the procedure. Patient was prepped and draped in the usual sterile fashion. The correct patient, procedure, and site was verified.   Injection Procedure Details:   Procedure diagnoses: Lumbar radiculopathy [M54.16]    Meds  Administered:  Meds ordered this encounter  Medications  . methylPREDNISolone acetate (DEPO-MEDROL) injection 40 mg    Laterality: Right  Location/Site:  L4-L5  Needle:5.0 in., 22 ga.  Short bevel or Quincke spinal needle  Needle Placement: Transforaminal  Findings:    -Comments: Excellent flow of contrast along the nerve, nerve root and into the epidural space.  Procedure Details: After squaring off the end-plates to get a true AP view, the C-arm was positioned so that an oblique view of the foramen as noted above was visualized. The target area is just inferior to the "nose of the scotty dog" or sub pedicular. The soft tissues overlying this structure were infiltrated with 2-3 ml. of 1% Lidocaine without Epinephrine.  The spinal needle was inserted toward the target using a "trajectory" view along the fluoroscope beam.  Under AP and lateral visualization, the needle was advanced so it did not puncture dura and was located close the 6 O'Clock position of the pedical in AP tracterory. Biplanar projections were used to confirm position. Aspiration was confirmed to be negative for CSF and/or blood. A 1-2 ml. volume of Isovue-250 was injected and flow of contrast was noted at each level. Radiographs were obtained for documentation purposes.   After attaining the desired flow of contrast documented above, a 0.5 to 1.0 ml test dose of 0.25% Marcaine was injected into each respective transforaminal space.  The patient was observed for 90 seconds post injection.  After no sensory deficits were reported, and normal lower extremity motor function was noted,   the above injectate was administered so  that equal amounts of the injectate were placed at each foramen (level) into the transforaminal epidural space.   Additional Comments:  The patient tolerated the procedure well Dressing: 2 x 2 sterile gauze and Band-Aid    Post-procedure details: Patient was observed during the  procedure. Post-procedure instructions were reviewed.  Patient left the clinic in stable condition.      Clinical History: MRI LUMBAR SPINE WITHOUT CONTRAST  TECHNIQUE: Multiplanar, multisequence MR imaging of the lumbar spine was performed. No intravenous contrast was administered.  COMPARISON:  2015  FINDINGS: Segmentation:  Standard.  Alignment:  Preserved and stable.  Vertebrae: Stable vertebral body heights. There is no substantial marrow edema. There is no suspicious osseous lesion.  Conus medullaris and cauda equina: Conus extends to the L1 level. Conus and cauda equina appear normal.  Paraspinal and other soft tissues: Unremarkable.  Disc levels:  L1-L2:  Disc bulge.  No canal or foraminal stenosis.  L2-L3:  Disc bulge.  No canal stenosis.  Minor foraminal stenosis.  L3-L4:  Disc bulge.  No canal stenosis.  Minor foraminal stenosis.  L4-L5: Disc bulge with endplate osteophytic ridging and superimposed central protrusion. Facet arthropathy with ligamentum flavum infolding. Minor canal stenosis. Narrowing of the subarticular recesses. Increased moderate to marked right foraminal stenosis. Mild left foraminal stenosis.  L5-S1: Disc bulge with endplate osteophytic ridging. Facet arthropathy. No canal stenosis. Moderate foraminal stenosis, right greater than left.  IMPRESSION: Multilevel degenerative changes as detailed above. There is increased right foraminal stenosis at L4-L5. Otherwise no substantial progression since 2015.   Electronically Signed   By: Macy Mis M.D.   On: 11/08/2020 08:29     Objective:  VS:  HT:    WT:   BMI:     BP:(!) 142/85  HR:76bpm  TEMP: ( )  RESP:  Physical Exam   Imaging: No results found.

## 2021-01-29 ENCOUNTER — Ambulatory Visit: Payer: Medicare Other

## 2021-01-30 ENCOUNTER — Other Ambulatory Visit: Payer: Self-pay

## 2021-01-30 ENCOUNTER — Encounter (HOSPITAL_COMMUNITY): Payer: Self-pay

## 2021-01-30 ENCOUNTER — Inpatient Hospital Stay (HOSPITAL_COMMUNITY)
Admission: EM | Admit: 2021-01-30 | Discharge: 2021-02-01 | DRG: 920 | Disposition: A | Discharge status: Home / Self Care | Payer: Medicare Other | Attending: Student | Admitting: Student

## 2021-01-30 ENCOUNTER — Telehealth: Payer: Self-pay | Admitting: Gastroenterology

## 2021-01-30 ENCOUNTER — Emergency Department (HOSPITAL_COMMUNITY): Payer: Medicare Other

## 2021-01-30 ENCOUNTER — Ambulatory Visit (AMBULATORY_SURGERY_CENTER): Payer: Medicare Other | Admitting: Gastroenterology

## 2021-01-30 ENCOUNTER — Encounter: Payer: Self-pay | Admitting: Gastroenterology

## 2021-01-30 VITALS — BP 136/68 | HR 63 | Temp 97.5°F | Resp 12 | Ht 69.0 in | Wt 238.0 lb

## 2021-01-30 DIAGNOSIS — K5731 Diverticulosis of large intestine without perforation or abscess with bleeding: Secondary | ICD-10-CM | POA: Diagnosis not present

## 2021-01-30 DIAGNOSIS — Z20822 Contact with and (suspected) exposure to covid-19: Secondary | ICD-10-CM | POA: Diagnosis not present

## 2021-01-30 DIAGNOSIS — Z888 Allergy status to other drugs, medicaments and biological substances status: Secondary | ICD-10-CM

## 2021-01-30 DIAGNOSIS — Z85828 Personal history of other malignant neoplasm of skin: Secondary | ICD-10-CM | POA: Diagnosis not present

## 2021-01-30 DIAGNOSIS — Z86010 Personal history of colonic polyps: Secondary | ICD-10-CM | POA: Diagnosis not present

## 2021-01-30 DIAGNOSIS — Z7982 Long term (current) use of aspirin: Secondary | ICD-10-CM

## 2021-01-30 DIAGNOSIS — E785 Hyperlipidemia, unspecified: Secondary | ICD-10-CM | POA: Diagnosis not present

## 2021-01-30 DIAGNOSIS — E669 Obesity, unspecified: Secondary | ICD-10-CM | POA: Diagnosis present

## 2021-01-30 DIAGNOSIS — Z8719 Personal history of other diseases of the digestive system: Secondary | ICD-10-CM | POA: Diagnosis not present

## 2021-01-30 DIAGNOSIS — Z91048 Other nonmedicinal substance allergy status: Secondary | ICD-10-CM

## 2021-01-30 DIAGNOSIS — K922 Gastrointestinal hemorrhage, unspecified: Secondary | ICD-10-CM

## 2021-01-30 DIAGNOSIS — D120 Benign neoplasm of cecum: Secondary | ICD-10-CM | POA: Diagnosis not present

## 2021-01-30 DIAGNOSIS — Z79899 Other long term (current) drug therapy: Secondary | ICD-10-CM

## 2021-01-30 DIAGNOSIS — Z6835 Body mass index (BMI) 35.0-35.9, adult: Secondary | ICD-10-CM

## 2021-01-30 DIAGNOSIS — Z85820 Personal history of malignant melanoma of skin: Secondary | ICD-10-CM | POA: Diagnosis not present

## 2021-01-30 DIAGNOSIS — K921 Melena: Secondary | ICD-10-CM | POA: Diagnosis not present

## 2021-01-30 DIAGNOSIS — D62 Acute posthemorrhagic anemia: Secondary | ICD-10-CM | POA: Diagnosis not present

## 2021-01-30 DIAGNOSIS — Y838 Other surgical procedures as the cause of abnormal reaction of the patient, or of later complication, without mention of misadventure at the time of the procedure: Secondary | ICD-10-CM | POA: Diagnosis present

## 2021-01-30 DIAGNOSIS — I10 Essential (primary) hypertension: Secondary | ICD-10-CM | POA: Diagnosis present

## 2021-01-30 DIAGNOSIS — D123 Benign neoplasm of transverse colon: Secondary | ICD-10-CM

## 2021-01-30 DIAGNOSIS — J302 Other seasonal allergic rhinitis: Secondary | ICD-10-CM | POA: Diagnosis not present

## 2021-01-30 DIAGNOSIS — D122 Benign neoplasm of ascending colon: Secondary | ICD-10-CM

## 2021-01-30 DIAGNOSIS — K91840 Postprocedural hemorrhage and hematoma of a digestive system organ or structure following a digestive system procedure: Principal | ICD-10-CM | POA: Diagnosis present

## 2021-01-30 DIAGNOSIS — K760 Fatty (change of) liver, not elsewhere classified: Secondary | ICD-10-CM | POA: Diagnosis not present

## 2021-01-30 DIAGNOSIS — Z87891 Personal history of nicotine dependence: Secondary | ICD-10-CM

## 2021-01-30 DIAGNOSIS — K219 Gastro-esophageal reflux disease without esophagitis: Secondary | ICD-10-CM | POA: Diagnosis not present

## 2021-01-30 DIAGNOSIS — K633 Ulcer of intestine: Secondary | ICD-10-CM | POA: Diagnosis present

## 2021-01-30 DIAGNOSIS — Z9889 Other specified postprocedural states: Secondary | ICD-10-CM | POA: Diagnosis not present

## 2021-01-30 DIAGNOSIS — R109 Unspecified abdominal pain: Secondary | ICD-10-CM | POA: Diagnosis not present

## 2021-01-30 DIAGNOSIS — I714 Abdominal aortic aneurysm, without rupture: Secondary | ICD-10-CM | POA: Diagnosis not present

## 2021-01-30 HISTORY — PX: COLONOSCOPY W/ POLYPECTOMY: SHX1380

## 2021-01-30 LAB — CBC WITH DIFFERENTIAL/PLATELET
Abs Immature Granulocytes: 0.05 10*3/uL (ref 0.00–0.07)
Basophils Absolute: 0.1 10*3/uL (ref 0.0–0.1)
Basophils Relative: 1 %
Eosinophils Absolute: 0.3 10*3/uL (ref 0.0–0.5)
Eosinophils Relative: 3 %
HCT: 36.8 % — ABNORMAL LOW (ref 39.0–52.0)
Hemoglobin: 12.8 g/dL — ABNORMAL LOW (ref 13.0–17.0)
Immature Granulocytes: 1 %
Lymphocytes Relative: 24 %
Lymphs Abs: 2.6 10*3/uL (ref 0.7–4.0)
MCH: 31.1 pg (ref 26.0–34.0)
MCHC: 34.8 g/dL (ref 30.0–36.0)
MCV: 89.3 fL (ref 80.0–100.0)
Monocytes Absolute: 1 10*3/uL (ref 0.1–1.0)
Monocytes Relative: 9 %
Neutro Abs: 6.9 10*3/uL (ref 1.7–7.7)
Neutrophils Relative %: 62 %
Platelets: 202 10*3/uL (ref 150–400)
RBC: 4.12 MIL/uL — ABNORMAL LOW (ref 4.22–5.81)
RDW: 12.1 % (ref 11.5–15.5)
WBC: 10.9 10*3/uL — ABNORMAL HIGH (ref 4.0–10.5)
nRBC: 0 % (ref 0.0–0.2)

## 2021-01-30 LAB — COMPREHENSIVE METABOLIC PANEL
ALT: 56 U/L — ABNORMAL HIGH (ref 0–44)
AST: 45 U/L — ABNORMAL HIGH (ref 15–41)
Albumin: 4.1 g/dL (ref 3.5–5.0)
Alkaline Phosphatase: 34 U/L — ABNORMAL LOW (ref 38–126)
Anion gap: 10 (ref 5–15)
BUN: 18 mg/dL (ref 8–23)
CO2: 22 mmol/L (ref 22–32)
Calcium: 9.2 mg/dL (ref 8.9–10.3)
Chloride: 106 mmol/L (ref 98–111)
Creatinine, Ser: 0.94 mg/dL (ref 0.61–1.24)
GFR, Estimated: >60 mL/min (ref >60)
Glucose, Bld: 101 mg/dL — ABNORMAL HIGH (ref 70–99)
Potassium: 3.9 mmol/L (ref 3.5–5.1)
Sodium: 138 mmol/L (ref 135–145)
Total Bilirubin: 1.3 mg/dL — ABNORMAL HIGH (ref 0.3–1.2)
Total Protein: 7.3 g/dL (ref 6.5–8.1)

## 2021-01-30 LAB — TYPE AND SCREEN
ABO/RH(D): A POS
Antibody Screen: NEGATIVE

## 2021-01-30 LAB — CBC
HCT: 39.4 % (ref 39.0–52.0)
Hemoglobin: 14 g/dL (ref 13.0–17.0)
MCH: 31.2 pg (ref 26.0–34.0)
MCHC: 35.5 g/dL (ref 30.0–36.0)
MCV: 87.8 fL (ref 80.0–100.0)
Platelets: 204 10*3/uL (ref 150–400)
RBC: 4.49 MIL/uL (ref 4.22–5.81)
RDW: 12 % (ref 11.5–15.5)
WBC: 11 10*3/uL — ABNORMAL HIGH (ref 4.0–10.5)
nRBC: 0 % (ref 0.0–0.2)

## 2021-01-30 MED ORDER — MORPHINE SULFATE (PF) 2 MG/ML IV SOLN: 1.0000 mg | Freq: Four times a day (QID) | INTRAVENOUS | Status: DC | PRN

## 2021-01-30 MED ORDER — ONDANSETRON HCL 4 MG PO TABS: 4.0000 mg | ORAL_TABLET | Freq: Four times a day (QID) | ORAL | Status: DC | PRN

## 2021-01-30 MED ORDER — IRBESARTAN-HYDROCHLOROTHIAZIDE 300-12.5 MG PO TABS: 1.0000 | ORAL_TABLET | Freq: Every day | ORAL | Status: DC

## 2021-01-30 MED ORDER — IOHEXOL 300 MG/ML  SOLN
100.0000 mL | Freq: Once | INTRAMUSCULAR | Status: AC | PRN
  Administered 2021-01-30: 100 mL via INTRAVENOUS

## 2021-01-30 MED ORDER — PANTOPRAZOLE SODIUM 40 MG IV SOLR
40.0000 mg | Freq: Two times a day (BID) | INTRAVENOUS | Status: DC
  Administered 2021-01-31 (×2): 40 mg via INTRAVENOUS
  Filled 2021-01-30 (×2): qty 40

## 2021-01-30 MED ORDER — IPRATROPIUM BROMIDE 0.02 % IN SOLN: 0.5000 mg | Freq: Four times a day (QID) | RESPIRATORY_TRACT | Status: DC | PRN

## 2021-01-30 MED ORDER — HYDROCHLOROTHIAZIDE 12.5 MG PO CAPS
12.5000 mg | ORAL_CAPSULE | Freq: Every day | ORAL | Status: DC
  Administered 2021-01-31 – 2021-02-01 (×2): 12.5 mg via ORAL
  Filled 2021-01-30 (×2): qty 1

## 2021-01-30 MED ORDER — ACETAMINOPHEN 650 MG RE SUPP: 650.0000 mg | Freq: Four times a day (QID) | RECTAL | Status: DC | PRN

## 2021-01-30 MED ORDER — LORATADINE 10 MG PO TABS
10.0000 mg | ORAL_TABLET | Freq: Every day | ORAL | Status: DC
  Administered 2021-01-31 – 2021-02-01 (×2): 10 mg via ORAL
  Filled 2021-01-30 (×2): qty 1

## 2021-01-30 MED ORDER — SODIUM CHLORIDE 0.9 % IV SOLN: 500.0000 mL | Freq: Once | INTRAVENOUS | Status: DC

## 2021-01-30 MED ORDER — SODIUM CHLORIDE 0.9 % IV SOLN: INTRAVENOUS | Status: DC

## 2021-01-30 MED ORDER — IPRATROPIUM-ALBUTEROL 0.5-2.5 (3) MG/3ML IN SOLN: 3.0000 mL | Freq: Four times a day (QID) | RESPIRATORY_TRACT | Status: DC | PRN

## 2021-01-30 MED ORDER — ALBUTEROL SULFATE (2.5 MG/3ML) 0.083% IN NEBU: 2.5000 mg | INHALATION_SOLUTION | Freq: Four times a day (QID) | RESPIRATORY_TRACT | Status: DC | PRN

## 2021-01-30 MED ORDER — AMLODIPINE BESYLATE 5 MG PO TABS
2.5000 mg | ORAL_TABLET | Freq: Every day | ORAL | Status: DC
  Administered 2021-01-31 – 2021-02-01 (×2): 2.5 mg via ORAL
  Filled 2021-01-30 (×3): qty 1

## 2021-01-30 MED ORDER — TRAZODONE HCL 50 MG PO TABS
25.0000 mg | ORAL_TABLET | Freq: Every evening | ORAL | Status: DC | PRN
  Filled 2021-01-30: qty 1

## 2021-01-30 MED ORDER — GABAPENTIN 300 MG PO CAPS
300.0000 mg | ORAL_CAPSULE | Freq: Two times a day (BID) | ORAL | Status: DC
  Administered 2021-01-31 – 2021-02-01 (×4): 300 mg via ORAL
  Filled 2021-01-30 (×4): qty 1

## 2021-01-30 MED ORDER — TIZANIDINE HCL 2 MG PO TABS
2.0000 mg | ORAL_TABLET | Freq: Two times a day (BID) | ORAL | Status: DC | PRN
  Filled 2021-01-30: qty 1

## 2021-01-30 MED ORDER — IRBESARTAN 150 MG PO TABS
300.0000 mg | ORAL_TABLET | Freq: Every day | ORAL | Status: DC
  Administered 2021-01-31 – 2021-02-01 (×2): 300 mg via ORAL
  Filled 2021-01-30 (×2): qty 2

## 2021-01-30 MED ORDER — ONDANSETRON HCL 4 MG/2ML IJ SOLN
4.0000 mg | Freq: Four times a day (QID) | INTRAMUSCULAR | Status: DC | PRN
Start: 2021-01-30 — End: 2021-02-01
  Administered 2021-01-31: 4 mg via INTRAVENOUS
  Filled 2021-01-30: qty 2

## 2021-01-30 MED ORDER — ACETAMINOPHEN 325 MG PO TABS: 650.0000 mg | ORAL_TABLET | Freq: Four times a day (QID) | ORAL | Status: DC | PRN

## 2021-01-30 NOTE — ED Triage Notes (Signed)
Patient states he had a colonoscopy today and since having the colonoscopy he has had a large amount of dark red blood x 3 episodes. Patient c/o right lower abdominal pain.

## 2021-01-30 NOTE — Op Note (Signed)
[facility] Patient Name: Larry Williamson Procedure Date: 01/30/2021 7:17 AM MRN: [id number] Endoscopist: Ladene Artist , MD Age: 70 Referring MD:  Date of Birth: 1951-05-30 Gender: Male Account #: [id number] Procedure:                Colonoscopy Indications:              Surveillance: Personal history of adenomatous                            polyps on last colonoscopy 3 years ago Medicines:                Monitored Anesthesia Care Procedure:                Pre-Anesthesia Assessment:                           - Prior to the procedure, a History and Physical                            was performed, and patient medications and                            allergies were reviewed. The patient's tolerance of                            previous anesthesia was also reviewed. The risks                            and benefits of the procedure and the sedation                            options and risks were discussed with the patient.                            All questions were answered, and informed consent                            was obtained. Prior Anticoagulants: The patient has                            taken no previous anticoagulant or antiplatelet                            agents. ASA Grade Assessment: III - A patient with                            severe systemic disease. After reviewing the risks                            and benefits, the patient was deemed in                            satisfactory condition to undergo the procedure.  After obtaining informed consent, the colonoscope                            was passed under direct vision. Throughout the                            procedure, the patient's blood pressure, pulse, and                            oxygen saturations were monitored continuously. The                            Olympus CF-HQ190 [id number]) Colonoscope was                            introduced through the anus  and advanced to the the                            cecum, identified by appendiceal orifice and                            ileocecal valve. The ileocecal valve, appendiceal                            orifice, and rectum were photographed. The quality                            of the bowel preparation was adequate after                            extensive lavage, suction. The colonoscopy was                            performed without difficulty. The patient tolerated                            the procedure well. Scope In: 8:08:37 AM Scope Out: 8:32:51 AM Scope Withdrawal Time: 0 hours 21 minutes 50 seconds  Total Procedure Duration: 0 hours 24 minutes 14 seconds  Findings:                 The perianal and digital rectal examinations were                            normal.                           A 15 mm polyp was found in the cecum. The polyp was                            sessile. The polyp was removed with a piecemeal                            technique using a hot snare. Resection and  retrieval were complete.                           A 25 mm polyp was found in the ascending colon. The                            polyp was sessile. The polyp was removed with a                            piecemeal technique using a hot snare. Resection                            and retrieval were complete.                           Six sessile polyps were found in the transverse                            colon (2) and ascending colon (4). The polyps were                            7 to 10 mm in size. These polyps were removed with                            a cold snare. Resection and retrieval were complete.                           Multiple medium-mouthed diverticula were found in                            the left colon. There was evidence of diverticular                            spasm. There was evidence of an impacted                            diverticulum.  There was no evidence of diverticular                            bleeding.                           The exam was otherwise without abnormality on                            direct and retroflexion views. Complications:            No immediate complications. Estimated blood loss:                            None. Estimated Blood Loss:     Estimated blood loss: none. Impression:               - One 15 mm polyp in the cecum, removed piecemeal  using a hot snare. Resected and retrieved.                           - One 25 mm polyp in the ascending colon, removed                            piecemeal using a hot snare. Resected and retrieved.                           - Six 7 to 10 mm polyps in the transverse colon and                            in the ascending colon, removed with a cold snare.                            Resected and retrieved.                           - Moderate diverticulosis in the left colon. There                            was evidence of diverticular spasm. There was                            evidence of an impacted diverticulum. There was no                            evidence of diverticular bleeding.                           - The examination was otherwise normal on direct                            and retroflexion views. Recommendation:           - Repeat colonoscopy in 6 months for surveillance                            after piecemeal polypectomy with a more extensive                            bowel prep.                           - Patient has a contact number available for                            emergencies. The signs and symptoms of potential                            delayed complications were discussed with the                            patient. Return to normal activities tomorrow.  Written discharge instructions were provided to the                            patient.                            - High fiber diet.                           - Continue present medications.                           - Await pathology results.                           - No aspirin, ibuprofen, naproxen, or other                            non-steroidal anti-inflammatory drugs for 2 weeks                            after polyp removal. Ladene Artist, MD 01/30/2021 8:40:21 AM This report has been signed electronically.

## 2021-01-30 NOTE — Progress Notes (Signed)
Vitals-CW  Pt's states no medical or surgical changes since previsit or office visit. 

## 2021-01-30 NOTE — Progress Notes (Signed)
Called to room to assist during endoscopic procedure.  Patient ID and intended procedure confirmed with present staff. Received instructions for my participation in the procedure from the performing physician.  

## 2021-01-30 NOTE — Telephone Encounter (Signed)
I reviewed his complaint and recommended emergent ED evaluation for a suspected post polypectomy bleed.

## 2021-01-30 NOTE — Patient Instructions (Signed)
Handouts given for Polyps, Diverticulosis and High Fiber diet.  Await pathology results.  Stay away from NSAIDS, (Aleve, ibuprofen, naproxen or aspirin) for 2 weeks.  You may take Tylenol if needed.  YOU HAD AN ENDOSCOPIC PROCEDURE TODAY AT [facility]:   Refer to the procedure report that was given to you for any specific questions about what was found during the examination.  If the procedure report does not answer your questions, please call your gastroenterologist to clarify.  If you requested that your care partner not be given the details of your procedure findings, then the procedure report has been included in a sealed envelope for you to review at your convenience later.  YOU SHOULD EXPECT: Some feelings of bloating in the abdomen. Passage of more gas than usual.  Walking can help get rid of the air that was put into your GI tract during the procedure and reduce the bloating. If you had a lower endoscopy (such as a colonoscopy or flexible sigmoidoscopy) you may notice spotting of blood in your stool or on the toilet paper. If you underwent a bowel prep for your procedure, you may not have a normal bowel movement for a few days.  Please Note:  You might notice some irritation and congestion in your nose or some drainage.  This is from the oxygen used during your procedure.  There is no need for concern and it should clear up in a day or so.  SYMPTOMS TO REPORT IMMEDIATELY:   Following lower endoscopy (colonoscopy or flexible sigmoidoscopy):  Excessive amounts of blood in the stool  Significant tenderness or worsening of abdominal pains  Swelling of the abdomen that is new, acute  Fever of 100F or higher  For urgent or emergent issues, a gastroenterologist can be reached at any hour by calling [phone]. Do not use MyChart messaging for urgent concerns.    DIET:  We do recommend a small meal at first, but then you may proceed to your regular diet.  Drink plenty  of fluids but you should avoid alcoholic beverages for 24 hours.  ACTIVITY:  You should plan to take it easy for the rest of today and you should NOT DRIVE or use heavy machinery until tomorrow (because of the sedation medicines used during the test).    FOLLOW UP: Our staff will call the number listed on your records 48-72 hours following your procedure to check on you and address any questions or concerns that you may have regarding the information given to you following your procedure. If we do not reach you, we will leave a message.  We will attempt to reach you two times.  During this call, we will ask if you have developed any symptoms of COVID 19. If you develop any symptoms (ie: fever, flu-like symptoms, shortness of breath, cough etc.) before then, please call [phone].  If you test positive for Covid 19 in the 2 weeks post procedure, please call and report this information to [place].    If any biopsies were taken you will be contacted by phone or by letter within the next 1-3 weeks.  Please call us at [phone] if you have not heard about the biopsies in 3 weeks.    SIGNATURES/CONFIDENTIALITY: You and/or your care partner have signed paperwork which will be entered into your electronic medical record.  These signatures attest to the fact that that the information above on your After Visit Summary has been reviewed and is understood.  Full responsibility of the confidentiality of this discharge information lies with you and/or your care-partner.

## 2021-01-30 NOTE — Telephone Encounter (Signed)
Spoke with pt, who states that he had one episode of passing blood when he went to the bathroom.  He denies passing clots, but states "entire stool was bloody."  Pt is having no other issues and states this only happened once.    Dr. Carlean Purl is the doctor of the day.  I spoke with him and he is going to review patient's procedure.  He advised for pt to go to ED if he passes further blood  When I called pt back, he states "I am sitting on the toilet right now passing pure blood."  I advised pt to go to [facility] ED.    Dr. Carlean Purl is aware and advised to send this note to Dr. Fuller Plan.  Dr. Henrene Pastor is on call this pm and I made him aware of this per Dr. Celesta Aver recommendation

## 2021-01-30 NOTE — Progress Notes (Signed)
PT taken to PACU. Monitors in place. VSS. Report given to RN. 

## 2021-01-30 NOTE — Telephone Encounter (Signed)
I spoke with the patient. He had another episode of bright red bleeding since speaking with Cyril Mourning, Therapist, sports.  Patient is instructed to proceed to the ED immediately.  He is advised not to drive himself.  He verbalized understanding and will go now.

## 2021-01-30 NOTE — H&P (Signed)
History and Physical   [facility] [place] [facility] [place] Admission History and Physical [employer], D.O.    Patient Name: Larry Williamson MR#: [id number] Date of Birth: 1950-11-07 Date of Admission: 01/30/2021  Referring MD/NP/PA: Dr. Joya Williamson Primary Care Physician: Larry Olp, MD  Chief Complaint:  Chief Complaint  Patient presents with  . Rectal Bleeding    HPI: Larry Williamson is a 70 y.o. male with a known history of adenomatous colon polyps, allergic rhinitis, iron deficiency anemia, diverticulosis, GERD, hypertension, hyperlipidemia presents to the emergency department for evaluation of bright red blood per rectum.  Patient was in a usual state of health until Tuesday morning at 8 AM he had a routine colonoscopy for history of colonic polyps.  8 polyps were removed.  He went home and had lunch of scrambled eggs and ham and cheese sandwich.  Around 3 PM on the same day as a colonoscopy he experienced first episode of bright red blood per rectum.  He states that there were no clots and no stool just copious amounts of bright red blood.  He has developed associated right lower quadrant abdominal pain.  He denies any associated nausea, vomiting.  He has had 2 additional episodes of bright red blood since arriving here in the hospital.  On the last episode he was sitting on the toilet and he became extremely diaphoretic, dizzy and lightheaded.  Patient denies fevers/chills, weakness, dizziness, chest pain, shortness of breath, dysuria/frequency, changes in mental status.    Otherwise there has been no change in status. Patient has been taking medication as prescribed and there has been no recent change in medication or diet.  No recent antibiotics.  There has been no recent illness, hospitalizations, travel or sick contacts.    EMS/ED Course:  Medical admission has been requested for further management of lower GI bleed status post colonoscopy.  Review of Systems:   CONSTITUTIONAL: Positive dizziness, lightheadedness and diaphoresis.  No fever/chills, fatigue,  weight gain/loss, headache. EYES: No blurry or double vision. ENT: No tinnitus, postnasal drip, redness or soreness of the oropharynx. RESPIRATORY: No cough, dyspnea, wheeze.  No hemoptysis.  CARDIOVASCULAR: No chest pain, palpitations, syncope, orthopnea. No lower extremity edema.  GASTROINTESTINAL: Positive hematochezia pain.  No nausea, vomiting, , diarrhea, constipation.  No hematemesis, melena  GENITOURINARY: No dysuria, frequency, hematuria. ENDOCRINE: No polyuria or nocturia. No heat or cold intolerance. HEMATOLOGY: No anemia, bruising, bleeding. INTEGUMENTARY: No rashes, ulcers, lesions. MUSCULOSKELETAL: No arthritis, gout, dyspnea. NEUROLOGIC: No numbness, tingling, ataxia, seizure-type activity, weakness. PSYCHIATRIC: No anxiety, depression, insomnia.   Past Medical History:  Diagnosis Date  . Adenomatous colon polyp 06/2006  . ALLERGIC RHINITIS   . Allergy    seasonal allergies  . Anemia    hx of  . ANEMIA DUE TO DIETARY IRON DEFICIENCY 05/28/2007   Due to giving regularly giving blood. Resolved with cutting in half.     . COMPRESSION FRACTURE, THORACIC VERTEBRA 03/09/2008   2009, no chronic pain   . Diverticulitis of colon   . DIVERTICULOSIS, COLON 05/28/2007   Qualifier: Diagnosis of  By: Larry Morale MD, Larry Williamson   . GERD (gastroesophageal reflux disease)    on meds  . Hyperlipidemia    on meds  . Hypertension    on meds  . Melanoma ([place]) 02/23/2014   MM in situe, early, low mid back, exc  . Squamous cell carcinoma in situ 11/10/2018   Top of right shoulder, CX3, 5FU  . Squamous cell carcinoma  of skin    SCC IN SITU TOP OF RIGHT SHOULDER TX CX3 5FU  . Ulcer 1992   gastric    Past Surgical History:  Procedure Laterality Date  . COLONOSCOPY  01/2017   hx polyps/tics/hems  . COLONOSCOPY  2019   [facility]-TICS/int hems/3 yr recall-  . COLONOSCOPY W/ POLYPECTOMY    . FOOT  SURGERY Right    pain scraper several inches into foot  . POLYPECTOMY  2018   piecemeal polyps TAx 2  . WISDOM TOOTH EXTRACTION       reports that he quit smoking about 42 years ago. His smoking use included cigarettes. He has a 15.00 pack-year smoking history. He has never used smokeless tobacco. He reports current alcohol use of about 14.0 standard drinks of alcohol per week. He reports that he does not use drugs.  Allergies  Allergen Reactions  . Atorvastatin     Other reaction(s): Cramp    Family History  Problem Relation Age of Onset  . Cancer Father        lung, former smoker[place]  . Dementia Mother   . Hemochromatosis Brother   . Colon cancer Neg Hx   . Esophageal cancer Neg Hx   . Rectal cancer Neg Hx   . Stomach cancer Neg Hx   . Colon polyps Neg Hx     Prior to Admission medications   Medication Sig Start Date End Date Taking? Authorizing Provider  amLODipine (NORVASC) 2.5 MG tablet TAKE 1 TABLET(2.5 MG) BY MOUTH DAILY 10/05/20  Yes Larry Olp, MD  aspirin 81 MG tablet Take 81 mg by mouth daily.   Yes [provider]  cetirizine (ZYRTEC) 10 MG tablet Take 10 mg by mouth daily.   Yes [provider]  gabapentin (NEURONTIN) 300 MG capsule Take 1 capsule (300 mg total) by mouth 2 (two) times daily. 01/23/21  Yes Larry Sinning, MD  irbesartan-hydrochlorothiazide (AVALIDE) 300-12.5 MG tablet TAKE 1 TABLET BY MOUTH DAILY 07/14/20  Yes Larry Olp, MD  MULTIPLE VITAMIN PO Take 1 tablet by mouth daily.   Yes [provider]  Omega-3 Fatty Acids (FISH OIL) 1000 MG CAPS Take 2 capsules by mouth daily.   Yes [provider]  omeprazole (PRILOSEC) 40 MG capsule TAKE 1 CAPSULE BY MOUTH EVERY DAY 12/06/20  Yes Larry Olp, MD  rosuvastatin (CRESTOR) 5 MG tablet TAKE 1 TABLET BY MOUTH once a week 07/06/20  Yes Larry Olp, MD  tadalafil (CIALIS) 10 MG tablet Take 5 mg by mouth daily as needed for erectile  dysfunction. 06/26/17  Yes [provider]  tizanidine (ZANAFLEX) 2 MG capsule TAKE 1 CAPSULE(2 MG) BY MOUTH TWICE DAILY AS NEEDED FOR MUSCLE SPASMS 02/25/20  Yes Larry Olp, MD  Cyanocobalamin (B-12) 1000 MCG CAPS Take 1,000 mcg by mouth daily. Patient not taking: Reported on 01/30/2021 10/28/19   Larry Olp, MD  polyethylene glycol (GOLYTELY) 236 g solution Take 4,000 mLs by mouth as directed. Patient not taking: Reported on 01/30/2021 01/16/21   Ladene Artist, MD  sildenafil (VIAGRA) 100 MG tablet Take 1 tablet (100 mg total) by mouth daily as needed for erectile dysfunction. Patient not taking: Reported on 01/30/2021 10/26/18   Larry Olp, MD    Physical Exam: Vitals:   01/30/21 2000 01/30/21 2030 01/30/21 2100 01/30/21 2130  BP: 140/69 (!) 159/80 (!) 146/70 (!) 146/76  Pulse: 70 75 72 71  Resp: (!) 24 (!) 22 [name])  22 19  Temp:      TempSrc:      SpO2: 94% 95% 98% 98%  Weight:      Height:        GENERAL: 70 y.o.-year-old white male patient, well-developed, well-nourished lying in the bed in no acute distress.  Pleasant and cooperative.   HEENT: Head atraumatic, normocephalic. Pupils equal. Mucus membranes moist. NECK: Supple. No JVD. CHEST: Normal breath sounds bilaterally. No wheezing, rales, rhonchi or crackles.  CARDIOVASCULAR: S1, S2 normal. No murmurs, rubs, or gallops. Cap refill <2 seconds. Pulses intact distally.  ABDOMEN: Soft, nondistended, mild right lower quadrant tenderness to palpation no rebound, guarding, rigidity. Normoactive bowel sounds present in all four quadrants.  EXTREMITIES: No pedal edema, cyanosis, or clubbing. No calf tenderness or Homan's sign.  NEUROLOGIC: The patient is alert and oriented x 3. Cranial nerves II through XII are grossly intact with no focal sensorimotor deficit. PSYCHIATRIC:  Normal affect, mood, thought content. SKIN: Warm, dry, and intact without obvious rash, lesion, or ulcer.    Labs on  Admission:  CBC: Recent Labs  Lab 01/30/21 1833  WBC 11.0*  HGB 14.0  HCT 39.4  MCV 87.8  PLT 725   Basic Metabolic Panel: Recent Labs  Lab 01/30/21 1833  NA 138  K 3.9  CL 106  CO2 22  GLUCOSE 101*  BUN 18  CREATININE 0.94  CALCIUM 9.2   GFR: Estimated Creatinine Clearance: 88.5 mL/min (by C-G formula based on SCr of 0.94 mg/dL). Liver Function Tests: Recent Labs  Lab 01/30/21 1833  AST 45*  ALT 56*  ALKPHOS 34*  BILITOT 1.3*  PROT 7.3  ALBUMIN 4.1   No results for input(s): LIPASE, AMYLASE in the last 168 hours. No results for input(s): AMMONIA in the last 168 hours. Coagulation Profile: No results for input(s): INR, PROTIME in the last 168 hours. Cardiac Enzymes: No results for input(s): CKTOTAL, CKMB, CKMBINDEX, TROPONINI in the last 168 hours. BNP (last 3 results) No results for input(s): PROBNP in the last 8760 hours. HbA1C: No results for input(s): HGBA1C in the last 72 hours. CBG: No results for input(s): GLUCAP in the last 168 hours. Lipid Profile: No results for input(s): CHOL, HDL, LDLCALC, TRIG, CHOLHDL, LDLDIRECT in the last 72 hours. Thyroid Function Tests: No results for input(s): TSH, T4TOTAL, FREET4, T3FREE, THYROIDAB in the last 72 hours. Anemia Panel: No results for input(s): VITAMINB12, FOLATE, FERRITIN, TIBC, IRON, RETICCTPCT in the last 72 hours. Urine analysis:    Component Value Date/Time   COLORURINE YELLOW 10/26/2018 0926   APPEARANCEUR CLEAR 10/26/2018 0926   LABSPEC 1.015 10/26/2018 0926   PHURINE 6.0 10/26/2018 0926   GLUCOSEU NEGATIVE 10/26/2018 0926   HGBUR NEGATIVE 10/26/2018 0926   HGBUR negative 12/04/2009 0817   BILIRUBINUR NEGATIVE 10/26/2018 0926   BILIRUBINUR neg 09/26/2016 1103   KETONESUR NEGATIVE 10/26/2018 0926   PROTEINUR neg 09/26/2016 1103   UROBILINOGEN 0.2 10/26/2018 0926   NITRITE NEGATIVE 10/26/2018 0926   LEUKOCYTESUR NEGATIVE 10/26/2018 0926   Sepsis  Labs: @LABRCNTIP (procalcitonin:4,lacticidven:4) )No results found for this or any previous visit (from the past 240 hour(s)).   Radiological Exams on Admission: CT Abdomen Pelvis W Contrast  Result Date: 01/30/2021 CLINICAL DATA:  Dark red blood after colonoscopy right lower abdominal pain EXAM: CT ABDOMEN AND PELVIS WITH CONTRAST TECHNIQUE: Multidetector CT imaging of the abdomen and pelvis was performed using the standard protocol following bolus administration of intravenous contrast. CONTRAST:  15mL OMNIPAQUE IOHEXOL 300 MG/ML  SOLN COMPARISON:  None. FINDINGS: Lower chest: Lung bases demonstrate no acute consolidation or effusion. Minimal foci of subpleural ground-glass density suggesting mild fibrosis. Cardiac size within normal limits. Hepatobiliary: Hepatic steatosis with probable sparing near the gallbladder fossa. No calcified stones. No biliary dilatation Pancreas: Unremarkable. No pancreatic ductal dilatation or surrounding inflammatory changes. Spleen: Normal in size without focal abnormality. Adrenals/Urinary Tract: Adrenal glands are normal. Kidneys show no hydronephrosis. The bladder is thick walled but suboptimally distended. Stomach/Bowel: The stomach is nonenlarged. No dilated small bowel. Left colon diverticular disease without acute inflammatory thickening. Hyperdense focus within the ascending colon, coronal series 5, image 57, just distal to the ileocecal junction. This changes in shape and attenuation on delayed views and would be consistent with an area of active bleeding/extravasation. Vascular/Lymphatic: Mild aortic atherosclerosis. No aneurysm. No suspicious nodes Reproductive: Prostate is unremarkable. Other: Negative for free air or free fluid. Fat within the inguinal canals. Small fat containing umbilical hernia. Musculoskeletal: No acute or significant osseous findings. Degenerative changes most advanced at L5-S1. IMPRESSION: 1. Hyperdense focus within the ascending colon just  cranial to the ileocecal junction, consistent with an area of active bleeding/extravasation. 2. Left colon diverticular disease without acute inflammatory thickening. 3. Hepatic steatosis. 4. Thick-walled urinary bladder likely due to under distension, though could correlate with urinalysis to exclude cystitis. Critical Value/emergent results were called by telephone at the time of interpretation on 01/30/2021 at 8:35 pm to provider [facility] , who verbally acknowledged these results. Aortic Atherosclerosis (ICD10-I70.0). Electronically Signed   By: Donavan Foil M.D.   On: 01/30/2021 20:35    EKG: Pending  Assessment/Plan  This is a 70 y.o. male with a history of adenomatous colon polyps, allergic rhinitis, iron deficiency anemia, diverticulosis, GERD, hypertension, hyperlipidemia now being admitted with:  #. Lower GI Bleed following colonoscopy.  Will likely need light prep and repeat scope in a.m. -Admit observation -IV Protonix 40mg  BID -Serial CBCs -Type and screen -Nothing by mouth -IV fluid hydration -Fall precautions -Hold anticoagulants -GI consultation has been requested by the emergency department provider  #.  Hypertension -Continue amlodipine, Avalide  #.  History of hyperlipidemia -Continue Crestor  #. History of seasonal allergies - Continue Zyrtec  #. History of pain - Continue Neurontin, Zanaflex  Admission status: Obs IV Fluids: NS Diet/Nutrition: NPO Consults called: GI  DVT Px: SCDs and early ambulation. Code Status: Full Code  Disposition Plan: To home in 1-2 days  All the records are reviewed and case discussed with ED provider. Management plans discussed with the patient and/or family who express understanding and agree with plan of care.  Bonny Vanleeuwen D.O. on 01/30/2021 at 9:43 PM CC: Primary care physician; Larry Olp, MD   01/30/2021, 9:43 PM

## 2021-01-30 NOTE — Telephone Encounter (Signed)
Same day post-polypectomy bleed. The patient is in the [facility] ER. VSS. Hg 14. To be admitted. On inpatient GI team list.

## 2021-01-30 NOTE — ED Provider Notes (Signed)
[facility] DEPT Provider Note   CSN: [id number] Arrival date & time: 01/30/21  1723     History Chief Complaint  Patient presents with  . Rectal Bleeding    Larry Williamson is a 70 y.o. male.  The history is provided by the patient.  Rectal Bleeding Quality:  Bright red Amount:  Copious Duration:  3 hours Timing:  Intermittent Chronicity:  New Context comment:  Had a colonoscopy today, 8 polyps removed Relieved by:  Nothing Worsened by:  Nothing Ineffective treatments:  None tried Associated symptoms: abdominal pain   Associated symptoms: no fever and no vomiting   Risk factors: no anticoagulant use        Past Medical History:  Diagnosis Date  . Adenomatous colon polyp 06/2006  . ALLERGIC RHINITIS   . Allergy    seasonal allergies  . Anemia    hx of  . ANEMIA DUE TO DIETARY IRON DEFICIENCY 05/28/2007   Due to giving regularly giving blood. Resolved with cutting in half.     . COMPRESSION FRACTURE, THORACIC VERTEBRA 03/09/2008   2009, no chronic pain   . Diverticulitis of colon   . DIVERTICULOSIS, COLON 05/28/2007   Qualifier: Diagnosis of  By: Arnoldo Morale MD, Balinda Quails   . GERD (gastroesophageal reflux disease)    on meds  . Hyperlipidemia    on meds  . Hypertension    on meds  . Melanoma ([place]) 02/23/2014   MM in situe, early, low mid back, exc  . Squamous cell carcinoma in situ 11/10/2018   Top of right shoulder, CX3, 5FU  . Squamous cell carcinoma of skin    SCC IN SITU TOP OF RIGHT SHOULDER TX CX3 5FU  . Ulcer 1992   gastric    Patient Active Problem List   Diagnosis Date Noted  . Myalgia due to statin 07/06/2020  . B12 deficiency 12/03/2019  . Dupuytren's contracture of right hand 10/28/2019  . Hyperglycemia 10/01/2016  . AAA (abdominal aortic aneurysm) ([place]) 01/18/2016  . Meralgia paresthetica 12/28/2014  . History of skin cancer 12/28/2014  . Former smoker 12/28/2014  . Abnormal liver enzymes 12/29/2010  .  Allergic rhinitis 06/17/2008  . ERECTILE DYSFUNCTION 12/07/2007  . History of colonic polyps 12/07/2007  . Umbilical hernia 25/02/3975  . Hyperlipidemia 05/15/2007  . Essential hypertension 05/15/2007  . GERD 05/15/2007    Past Surgical History:  Procedure Laterality Date  . COLONOSCOPY  01/2017   hx polyps/tics/hems  . COLONOSCOPY  2019   [facility]-TICS/int hems/3 yr recall-  . COLONOSCOPY W/ POLYPECTOMY    . FOOT SURGERY Right    pain scraper several inches into foot  . POLYPECTOMY  2018   piecemeal polyps TAx 2  . WISDOM TOOTH EXTRACTION         Family History  Problem Relation Age of Onset  . Cancer Father        lung, former smoker, [place] lung [place]  . Dementia Mother   . Hemochromatosis Brother   . Colon cancer Neg Hx   . Esophageal cancer Neg Hx   . Rectal cancer Neg Hx   . Stomach cancer Neg Hx   . Colon polyps Neg Hx     Social History   Tobacco Use  . Smoking status: Former Smoker    Packs/day: 1.00    Years: 15.00    Pack years: 15.00    Types: Cigarettes    Quit date: 10/21/1978    Years since quitting: 42.3  .  Smokeless tobacco: Never Used  Vaping Use  . Vaping Use: Never used  Substance Use Topics  . Alcohol use: Yes    Alcohol/week: 14.0 standard drinks    Types: 14 Standard drinks or equivalent per week    Comment: 2 per day  . Drug use: No    Home Medications Prior to Admission medications   Medication Sig Start Date End Date Taking? Authorizing Provider  amLODipine (NORVASC) 2.5 MG tablet TAKE 1 TABLET(2.5 MG) BY MOUTH DAILY 10/05/20   Marin Olp, MD  aspirin 81 MG tablet Take 81 mg by mouth daily.    [provider]  cetirizine (ZYRTEC) 10 MG tablet Take 10 mg by mouth daily.    [provider]  Cyanocobalamin (B-12) 1000 MCG CAPS Take 1,000 mcg by mouth daily. 10/28/19   Marin Olp, MD  gabapentin (NEURONTIN) 300 MG capsule Take 1 capsule (300 mg total) by mouth 2 (two) times daily. 01/23/21   Magnus Sinning, MD  irbesartan-hydrochlorothiazide (AVALIDE) 300-12.5 MG tablet TAKE 1 TABLET BY MOUTH DAILY 07/14/20   Marin Olp, MD  MULTIPLE VITAMIN PO Take by mouth daily.    [provider]  Omega-3 Fatty Acids (FISH OIL) 1000 MG CAPS Take 2 capsules by mouth daily.    [provider]  omeprazole (PRILOSEC) 40 MG capsule TAKE 1 CAPSULE BY MOUTH EVERY DAY 12/06/20   Marin Olp, MD  polyethylene glycol (GOLYTELY) 236 g solution Take 4,000 mLs by mouth as directed. 01/16/21   Ladene Artist, MD  rosuvastatin (CRESTOR) 5 MG tablet TAKE 1 TABLET BY MOUTH once a week 07/06/20   Marin Olp, MD  sildenafil (VIAGRA) 100 MG tablet Take 1 tablet (100 mg total) by mouth daily as needed for erectile dysfunction. 10/26/18   Marin Olp, MD  tizanidine (ZANAFLEX) 2 MG capsule TAKE 1 CAPSULE(2 MG) BY MOUTH TWICE DAILY AS NEEDED FOR MUSCLE SPASMS 02/25/20   Marin Olp, MD    Allergies    Atorvastatin  Review of Systems   Review of Systems  Constitutional: Negative for chills and fever.  HENT: Negative for ear pain and sore throat.   Eyes: Negative for pain and visual disturbance.  Respiratory: Negative for cough and shortness of breath.   Cardiovascular: Negative for chest pain and palpitations.  Gastrointestinal: Positive for abdominal pain and hematochezia. Negative for vomiting.  Genitourinary: Negative for dysuria and hematuria.  Musculoskeletal: Negative for arthralgias and back pain.  Skin: Negative for color change and rash.  Neurological: Negative for seizures and syncope.  All other systems reviewed and are negative.   Physical Exam Updated Vital Signs BP (!) 170/87 (BP Location: Left Arm)   Pulse 87   Temp 98.3 F (36.8 C) (Oral)   Resp 18   Ht 5\' 9"  (1.753 m)   Wt 108 kg   SpO2 95%   BMI 35.15 kg/m   Physical Exam Vitals and nursing note reviewed.  Constitutional:      Appearance: He is well-developed.  HENT:     Head:  Normocephalic and atraumatic.  Eyes:     Conjunctiva/sclera: Conjunctivae normal.  Cardiovascular:     Rate and Rhythm: Normal rate and regular rhythm.     Heart sounds: No murmur heard.   Pulmonary:     Effort: Pulmonary effort is normal. No respiratory distress.     Breath sounds: Normal breath sounds.  Abdominal:     Palpations: Abdomen is soft.  Tenderness: There is no abdominal tenderness.  Genitourinary:    Comments: Bright red blood at anal area Musculoskeletal:     Cervical back: Neck supple.  Skin:    General: Skin is warm and dry.  Neurological:     Mental Status: He is alert.     ED Results / Procedures / Treatments   Labs (all labs ordered are listed, but only abnormal results are displayed) Labs Reviewed  COMPREHENSIVE METABOLIC PANEL - Abnormal; Notable for the following components:      Result Value   Glucose, Bld 101 (*)    AST 45 (*)    ALT 56 (*)    Alkaline Phosphatase 34 (*)    Total Bilirubin 1.3 (*)    All other components within normal limits  CBC - Abnormal; Notable for the following components:   WBC 11.0 (*)    All other components within normal limits  CBC WITH DIFFERENTIAL/PLATELET  TYPE AND SCREEN    EKG None  Radiology CT Abdomen Pelvis W Contrast  Result Date: 01/30/2021 CLINICAL DATA:  Dark red blood after colonoscopy right lower abdominal pain EXAM: CT ABDOMEN AND PELVIS WITH CONTRAST TECHNIQUE: Multidetector CT imaging of the abdomen and pelvis was performed using the standard protocol following bolus administration of intravenous contrast. CONTRAST:  151mL OMNIPAQUE IOHEXOL 300 MG/ML  SOLN COMPARISON:  None. FINDINGS: Lower chest: Lung bases demonstrate no acute consolidation or effusion. Minimal foci of subpleural ground-glass density suggesting mild fibrosis. Cardiac size within normal limits. Hepatobiliary: Hepatic steatosis with probable sparing near the gallbladder fossa. No calcified stones. No biliary dilatation Pancreas:  Unremarkable. No pancreatic ductal dilatation or surrounding inflammatory changes. Spleen: Normal in size without focal abnormality. Adrenals/Urinary Tract: Adrenal glands are normal. Kidneys show no hydronephrosis. The bladder is thick walled but suboptimally distended. Stomach/Bowel: The stomach is nonenlarged. No dilated small bowel. Left colon diverticular disease without acute inflammatory thickening. Hyperdense focus within the ascending colon, coronal series 5, image 57, just distal to the ileocecal junction. This changes in shape and attenuation on delayed views and would be consistent with an area of active bleeding/extravasation. Vascular/Lymphatic: Mild aortic atherosclerosis. No aneurysm. No suspicious nodes Reproductive: Prostate is unremarkable. Other: Negative for free air or free fluid. Fat within the inguinal canals. Small fat containing umbilical hernia. Musculoskeletal: No acute or significant osseous findings. Degenerative changes most advanced at L5-S1. IMPRESSION: 1. Hyperdense focus within the ascending colon just cranial to the ileocecal junction, consistent with an area of active bleeding/extravasation. 2. Left colon diverticular disease without acute inflammatory thickening. 3. Hepatic steatosis. 4. Thick-walled urinary bladder likely due to under distension, though could correlate with urinalysis to exclude cystitis. Critical Value/emergent results were called by telephone at the time of interpretation on 01/30/2021 at 8:35 pm to provider [facility] , who verbally acknowledged these results. Aortic Atherosclerosis (ICD10-I70.0). Electronically Signed   By: Donavan Foil M.D.   On: 01/30/2021 20:35    Procedures Procedures   Medications Ordered in ED Medications - No data to display  ED Course  I have reviewed the triage vital signs and the nursing notes.  Pertinent labs & imaging results that were available during my care of the patient were reviewed by me and considered in my  medical decision making (see chart for details).  Clinical Course as of 01/30/21 2102  Tue Jan 30, 2021  2052 Dr. Henrene Pastor recommends admission for supportive care. Will do colonoscopy in the am if he is still bleeding.  [AW]  2059  Patient states that he has had ongoing, large-volume, bloody bowel movements. [AW]  2101 I spoke to Dr. Ara Kussmaul who will admit the patient. [AW]    Clinical Course User Index [AW] Arnaldo Natal, MD   MDM Rules/Calculators/A&P                          [name] presents after colonoscopy complaining of rectal bleeding.  He is hemodynamically stable but has continued to have rectal bleeding in the ED.  It appears that he is bleeding from a cecal polypectomy.  He will be admitted for supportive care and intervention as needed.  He is not on any blood thinners and will not need any reversal of anticoagulation. Final Clinical Impression(s) / ED Diagnoses Final diagnoses:  Acute GI bleeding    Rx / DC Orders ED Discharge Orders    None       Arnaldo Natal, MD 01/30/21 2103

## 2021-01-30 NOTE — Telephone Encounter (Signed)
Sheri, please call that patient back. He had 8 polyps removed today at colonoscopy. One cecal and one ascending colon polyp removed piecemeal by hot snare. Suspected post polypectomy bleed. Recommend he goes to [facility] ED immediately for evaluation and likely admission.   FYI for Dr. Henrene Pastor on call tonight

## 2021-01-31 ENCOUNTER — Encounter (HOSPITAL_COMMUNITY)
Admission: EM | Disposition: A | Discharge status: Home / Self Care | Payer: Self-pay | Source: Home / Self Care | Attending: Student

## 2021-01-31 ENCOUNTER — Encounter (HOSPITAL_COMMUNITY): Payer: Self-pay | Admitting: Family Medicine

## 2021-01-31 ENCOUNTER — Observation Stay (HOSPITAL_COMMUNITY): Payer: Medicare Other | Admitting: Anesthesiology

## 2021-01-31 DIAGNOSIS — E669 Obesity, unspecified: Secondary | ICD-10-CM

## 2021-01-31 DIAGNOSIS — K633 Ulcer of intestine: Secondary | ICD-10-CM | POA: Diagnosis not present

## 2021-01-31 DIAGNOSIS — K921 Melena: Secondary | ICD-10-CM | POA: Diagnosis not present

## 2021-01-31 DIAGNOSIS — Y838 Other surgical procedures as the cause of abnormal reaction of the patient, or of later complication, without mention of misadventure at the time of the procedure: Secondary | ICD-10-CM | POA: Diagnosis present

## 2021-01-31 DIAGNOSIS — K922 Gastrointestinal hemorrhage, unspecified: Secondary | ICD-10-CM | POA: Diagnosis not present

## 2021-01-31 DIAGNOSIS — I10 Essential (primary) hypertension: Secondary | ICD-10-CM | POA: Diagnosis not present

## 2021-01-31 DIAGNOSIS — Z85820 Personal history of malignant melanoma of skin: Secondary | ICD-10-CM | POA: Diagnosis not present

## 2021-01-31 DIAGNOSIS — J302 Other seasonal allergic rhinitis: Secondary | ICD-10-CM | POA: Diagnosis not present

## 2021-01-31 DIAGNOSIS — Z6835 Body mass index (BMI) 35.0-35.9, adult: Secondary | ICD-10-CM | POA: Diagnosis not present

## 2021-01-31 DIAGNOSIS — Z87891 Personal history of nicotine dependence: Secondary | ICD-10-CM | POA: Diagnosis not present

## 2021-01-31 DIAGNOSIS — Z79899 Other long term (current) drug therapy: Secondary | ICD-10-CM | POA: Diagnosis not present

## 2021-01-31 DIAGNOSIS — K219 Gastro-esophageal reflux disease without esophagitis: Secondary | ICD-10-CM | POA: Diagnosis not present

## 2021-01-31 DIAGNOSIS — D62 Acute posthemorrhagic anemia: Secondary | ICD-10-CM | POA: Diagnosis present

## 2021-01-31 DIAGNOSIS — K91840 Postprocedural hemorrhage and hematoma of a digestive system organ or structure following a digestive system procedure: Secondary | ICD-10-CM | POA: Diagnosis not present

## 2021-01-31 DIAGNOSIS — Z7982 Long term (current) use of aspirin: Secondary | ICD-10-CM | POA: Diagnosis not present

## 2021-01-31 DIAGNOSIS — Z8719 Personal history of other diseases of the digestive system: Secondary | ICD-10-CM | POA: Diagnosis not present

## 2021-01-31 DIAGNOSIS — Z9889 Other specified postprocedural states: Secondary | ICD-10-CM | POA: Diagnosis not present

## 2021-01-31 DIAGNOSIS — Z86010 Personal history of colonic polyps: Secondary | ICD-10-CM | POA: Diagnosis not present

## 2021-01-31 DIAGNOSIS — Z91048 Other nonmedicinal substance allergy status: Secondary | ICD-10-CM | POA: Diagnosis not present

## 2021-01-31 DIAGNOSIS — Z20822 Contact with and (suspected) exposure to covid-19: Secondary | ICD-10-CM | POA: Diagnosis not present

## 2021-01-31 DIAGNOSIS — E785 Hyperlipidemia, unspecified: Secondary | ICD-10-CM | POA: Diagnosis not present

## 2021-01-31 DIAGNOSIS — Z85828 Personal history of other malignant neoplasm of skin: Secondary | ICD-10-CM | POA: Diagnosis not present

## 2021-01-31 DIAGNOSIS — Z888 Allergy status to other drugs, medicaments and biological substances status: Secondary | ICD-10-CM | POA: Diagnosis not present

## 2021-01-31 HISTORY — PX: HEMOSTASIS CLIP PLACEMENT: SHX6857

## 2021-01-31 HISTORY — PX: COLONOSCOPY WITH PROPOFOL: SHX5780

## 2021-01-31 LAB — CBC
HCT: 36.2 % — ABNORMAL LOW (ref 39.0–52.0)
HCT: 34.3 % — ABNORMAL LOW (ref 39.0–52.0)
HCT: 34.8 % — ABNORMAL LOW (ref 39.0–52.0)
HCT: 37.2 % — ABNORMAL LOW (ref 39.0–52.0)
HCT: 32.4 % — ABNORMAL LOW (ref 39.0–52.0)
Hemoglobin: 12.2 g/dL — ABNORMAL LOW (ref 13.0–17.0)
Hemoglobin: 12 g/dL — ABNORMAL LOW (ref 13.0–17.0)
Hemoglobin: 12 g/dL — ABNORMAL LOW (ref 13.0–17.0)
Hemoglobin: 12.9 g/dL — ABNORMAL LOW (ref 13.0–17.0)
Hemoglobin: 10.9 g/dL — ABNORMAL LOW (ref 13.0–17.0)
MCH: 31 pg (ref 26.0–34.0)
MCH: 31.1 pg (ref 26.0–34.0)
MCH: 31.3 pg (ref 26.0–34.0)
MCH: 31.8 pg (ref 26.0–34.0)
MCH: 31.1 pg (ref 26.0–34.0)
MCHC: 33.7 g/dL (ref 30.0–36.0)
MCHC: 34.5 g/dL (ref 30.0–36.0)
MCHC: 35 g/dL (ref 30.0–36.0)
MCHC: 34.7 g/dL (ref 30.0–36.0)
MCHC: 33.6 g/dL (ref 30.0–36.0)
MCV: 92.1 fL (ref 80.0–100.0)
MCV: 89.3 fL (ref 80.0–100.0)
MCV: 90.2 fL (ref 80.0–100.0)
MCV: 91.6 fL (ref 80.0–100.0)
MCV: 92.3 fL (ref 80.0–100.0)
Platelets: 188 10*3/uL (ref 150–400)
Platelets: 194 10*3/uL (ref 150–400)
Platelets: 196 10*3/uL (ref 150–400)
Platelets: 203 10*3/uL (ref 150–400)
Platelets: 174 10*3/uL (ref 150–400)
RBC: 3.93 MIL/uL — ABNORMAL LOW (ref 4.22–5.81)
RBC: 3.84 MIL/uL — ABNORMAL LOW (ref 4.22–5.81)
RBC: 3.86 MIL/uL — ABNORMAL LOW (ref 4.22–5.81)
RBC: 4.06 MIL/uL — ABNORMAL LOW (ref 4.22–5.81)
RBC: 3.51 MIL/uL — ABNORMAL LOW (ref 4.22–5.81)
RDW: 12.1 % (ref 11.5–15.5)
RDW: 12.1 % (ref 11.5–15.5)
RDW: 12.1 % (ref 11.5–15.5)
RDW: 12.1 % (ref 11.5–15.5)
RDW: 12.3 % (ref 11.5–15.5)
WBC: 10.7 10*3/uL — ABNORMAL HIGH (ref 4.0–10.5)
WBC: 9.5 10*3/uL (ref 4.0–10.5)
WBC: 9.7 10*3/uL (ref 4.0–10.5)
WBC: 9.6 10*3/uL (ref 4.0–10.5)
WBC: 7.5 10*3/uL (ref 4.0–10.5)
nRBC: 0 % (ref 0.0–0.2)
nRBC: 0 % (ref 0.0–0.2)
nRBC: 0 % (ref 0.0–0.2)
nRBC: 0 % (ref 0.0–0.2)
nRBC: 0 % (ref 0.0–0.2)

## 2021-01-31 LAB — COMPREHENSIVE METABOLIC PANEL
ALT: 47 U/L — ABNORMAL HIGH (ref 0–44)
AST: 36 U/L (ref 15–41)
Albumin: 3.5 g/dL (ref 3.5–5.0)
Alkaline Phosphatase: 29 U/L — ABNORMAL LOW (ref 38–126)
Anion gap: 10 (ref 5–15)
BUN: 19 mg/dL (ref 8–23)
CO2: 19 mmol/L — ABNORMAL LOW (ref 22–32)
Calcium: 8.4 mg/dL — ABNORMAL LOW (ref 8.9–10.3)
Chloride: 106 mmol/L (ref 98–111)
Creatinine, Ser: 1.14 mg/dL (ref 0.61–1.24)
GFR, Estimated: >60 mL/min (ref >60)
Glucose, Bld: 137 mg/dL — ABNORMAL HIGH (ref 70–99)
Potassium: 4.3 mmol/L (ref 3.5–5.1)
Sodium: 135 mmol/L (ref 135–145)
Total Bilirubin: 1.3 mg/dL — ABNORMAL HIGH (ref 0.3–1.2)
Total Protein: 6.4 g/dL — ABNORMAL LOW (ref 6.5–8.1)

## 2021-01-31 LAB — HEMOGLOBIN AND HEMATOCRIT, BLOOD
HCT: 36.9 % — ABNORMAL LOW (ref 39.0–52.0)
Hemoglobin: 12.8 g/dL — ABNORMAL LOW (ref 13.0–17.0)

## 2021-01-31 LAB — HIV ANTIBODY (ROUTINE TESTING W REFLEX): HIV Screen 4th Generation wRfx: NONREACTIVE

## 2021-01-31 LAB — SARS CORONAVIRUS 2 (TAT 6-24 HRS): SARS Coronavirus 2: NEGATIVE

## 2021-01-31 SURGERY — COLONOSCOPY WITH PROPOFOL
Anesthesia: Monitor Anesthesia Care

## 2021-01-31 MED ORDER — PROPOFOL 500 MG/50ML IV EMUL
INTRAVENOUS | Status: DC | PRN
  Administered 2021-01-31: 135 ug/kg/min via INTRAVENOUS

## 2021-01-31 MED ORDER — PROPOFOL 10 MG/ML IV BOLUS
INTRAVENOUS | Status: DC | PRN
  Administered 2021-01-31: 30 mg via INTRAVENOUS

## 2021-01-31 MED ORDER — LACTATED RINGERS IV SOLN: INTRAVENOUS | Status: DC | PRN

## 2021-01-31 MED ORDER — PEG-KCL-NACL-NASULF-NA ASC-C 100 G PO SOLR
1.0000 | Freq: Once | ORAL | Status: AC
  Administered 2021-01-31: 200 g via ORAL
  Filled 2021-01-31: qty 1

## 2021-01-31 SURGICAL SUPPLY — 21 items

## 2021-01-31 NOTE — Anesthesia Postprocedure Evaluation (Deleted)
Anesthesia Post Note  Patient: Larry Williamson  Procedure(s) Performed: COLONOSCOPY WITH PROPOFOL (N/A ) HEMOSTASIS CLIP PLACEMENT     Patient location during evaluation: PACU Anesthesia Type: MAC Level of consciousness: awake and alert Pain management: pain level controlled Vital Signs Assessment: post-procedure vital signs reviewed and stable Respiratory status: spontaneous breathing, nonlabored ventilation, respiratory function stable and patient connected to nasal cannula oxygen Cardiovascular status: stable and blood pressure returned to baseline Postop Assessment: no apparent nausea or vomiting Anesthetic complications: no   No complications documented.  Last Vitals:  Vitals:   01/31/21 1440 01/31/21 1450  BP: 123/66 (!) 140/59  Pulse: 76 68  Resp: (!) 24 20  Temp:    SpO2: 100% 98%    Last Pain:  Vitals:   01/31/21 1440  TempSrc:   PainSc: 0-No pain                 Lyanna Blystone S

## 2021-01-31 NOTE — H&P (View-Only) (Signed)
Consultation  Referring Provider: TRH/ GonfaPrimary Care Physician:  Marin Olp, MD Primary Gastroenterologist:  Dr Fuller Plan .  Reason for Consultation:  GI bleed  HPI: Larry Williamson is a 70 y.o. male, known to Dr. Fuller Plan with history of adenomatous polyps.  Patient underwent colonoscopy yesterday in the [facility] with finding of total of 8 polyps all of which were removed.  He had a 25 mm polyp in the ascending colon and a 15 mm polyp in the cecum both of which were removed with hot snare. There were 6 polyps in the ascending and transverse colon, smaller and removed with cold snare.  Also noted to have multiple diverticuli. Plan was for follow-up colonoscopy in 6 months to reassess the polyp site from the larger 2 polyps.  Patient is not anticoagulated.  He went home and in about 3 PM yesterday passed a large amount of bright red blood.  He was instructed to come to the emergency room for evaluation.  He had 2 other episodes yesterday afternoon night with dark and red blood.  1 of these episodes was associated with diaphoresis and dizziness.  Patient did not have any bowel movements for several hours and then this morning about 7:30 AM he says he had another bowel movement and filled the bowl with blood.  He is feeling okay currently.  He says he has some mild soreness in the right lower quadrant.  Labs hemoglobin 14 on arrival, 12.2 last evening and 12 at 4:30 this a.m. CT abdomen pelvis last night-hyperdense focus within the ascending colon cranial to the ileocecal junction consistent with an area of active bleeding/extravasation  Covid test negative He has been hemodynamically stable  Other medical problems include GERD, hypertension and hyperlipidemia.   Past Medical History:  Diagnosis Date  . Adenomatous colon polyp 06/2006  . ALLERGIC RHINITIS   . Allergy    seasonal allergies  . Anemia    hx of  . ANEMIA DUE TO DIETARY IRON DEFICIENCY 05/28/2007   Due to giving  regularly giving blood. Resolved with cutting in half.     . COMPRESSION FRACTURE, THORACIC VERTEBRA 03/09/2008   2009, no chronic pain   . Diverticulitis of colon   . DIVERTICULOSIS, COLON 05/28/2007   Qualifier: Diagnosis of  By: Arnoldo Morale MD, Balinda Quails   . GERD (gastroesophageal reflux disease)    on meds  . Hyperlipidemia    on meds  . Hypertension    on meds  . Melanoma ([place]) 02/23/2014   MM in situe, early, low mid back, exc  . Squamous cell carcinoma in situ 11/10/2018   Top of right shoulder, CX3, 5FU  . Squamous cell carcinoma of skin    SCC IN SITU TOP OF RIGHT SHOULDER TX CX3 5FU  . Ulcer 1992   gastric    Past Surgical History:  Procedure Laterality Date  . COLONOSCOPY  01/2017   hx polyps/tics/hems  . COLONOSCOPY  2019   [facility]-TICS/int hems/3 yr recall-  . COLONOSCOPY W/ POLYPECTOMY    . FOOT SURGERY Right    pain scraper several inches into foot  . POLYPECTOMY  2018   piecemeal polyps TAx 2  . WISDOM TOOTH EXTRACTION      Prior to Admission medications   Medication Sig Start Date End Date Taking? Authorizing Provider  amLODipine (NORVASC) 2.5 MG tablet TAKE 1 TABLET(2.5 MG) BY MOUTH DAILY 10/05/20  Yes Marin Olp, MD  aspirin 81 MG tablet Take 81 mg by mouth  daily.   Yes [provider]  cetirizine (ZYRTEC) 10 MG tablet Take 10 mg by mouth daily.   Yes [provider]  gabapentin (NEURONTIN) 300 MG capsule Take 1 capsule (300 mg total) by mouth 2 (two) times daily. 01/23/21  Yes Magnus Sinning, MD  irbesartan-hydrochlorothiazide (AVALIDE) 300-12.5 MG tablet TAKE 1 TABLET BY MOUTH DAILY 07/14/20  Yes Marin Olp, MD  MULTIPLE VITAMIN PO Take 1 tablet by mouth daily.   Yes [provider]  Omega-3 Fatty Acids (FISH OIL) 1000 MG CAPS Take 2 capsules by mouth daily.   Yes [provider]  omeprazole (PRILOSEC) 40 MG capsule TAKE 1 CAPSULE BY MOUTH EVERY DAY 12/06/20  Yes Marin Olp, MD  rosuvastatin (CRESTOR) 5 MG  tablet TAKE 1 TABLET BY MOUTH once a week 07/06/20  Yes Marin Olp, MD  tadalafil (CIALIS) 10 MG tablet Take 5 mg by mouth daily as needed for erectile dysfunction. 06/26/17  Yes [provider]  tizanidine (ZANAFLEX) 2 MG capsule TAKE 1 CAPSULE(2 MG) BY MOUTH TWICE DAILY AS NEEDED FOR MUSCLE SPASMS 02/25/20  Yes Marin Olp, MD  Cyanocobalamin (B-12) 1000 MCG CAPS Take 1,000 mcg by mouth daily. Patient not taking: Reported on 01/30/2021 10/28/19   Marin Olp, MD  polyethylene glycol (GOLYTELY) 236 g solution Take 4,000 mLs by mouth as directed. Patient not taking: Reported on 01/30/2021 01/16/21   Ladene Artist, MD  sildenafil (VIAGRA) 100 MG tablet Take 1 tablet (100 mg total) by mouth daily as needed for erectile dysfunction. Patient not taking: Reported on 01/30/2021 10/26/18   Marin Olp, MD    Current Facility-Administered Medications  Medication Dose Route Frequency Provider Last Rate Last Admin  . 0.9 %  sodium chloride infusion   Intravenous Continuous Hugelmeyer, Alexis, DO 75 mL/hr at 01/31/21 0004 New Bag at 01/31/21 0004  . acetaminophen (TYLENOL) tablet 650 mg  650 mg Oral Q6H PRN Hugelmeyer, Alexis, DO       Or  . acetaminophen (TYLENOL) suppository 650 mg  650 mg Rectal Q6H PRN Hugelmeyer, Alexis, DO      . amLODipine (NORVASC) tablet 2.5 mg  2.5 mg Oral Daily Hugelmeyer, Alexis, DO      . gabapentin (NEURONTIN) capsule 300 mg  300 mg Oral BID Hugelmeyer, Alexis, DO   300 mg at 01/31/21 0005  . irbesartan (AVAPRO) tablet 300 mg  300 mg Oral Daily Hugelmeyer, Alexis, DO       And  . hydrochlorothiazide (MICROZIDE) capsule 12.5 mg  12.5 mg Oral Daily Hugelmeyer, Alexis, DO      . ipratropium-albuterol (DUONEB) 0.5-2.5 (3) MG/3ML nebulizer solution 3 mL  3 mL Nebulization Q6H PRN Hugelmeyer, Alexis, DO      . loratadine (CLARITIN) tablet 10 mg  10 mg Oral Daily Hugelmeyer, Alexis, DO      . morphine 2 MG/ML injection 1 mg  1 mg Intravenous Q6H PRN  Hugelmeyer, Alexis, DO      . ondansetron (ZOFRAN) tablet 4 mg  4 mg Oral Q6H PRN Hugelmeyer, Alexis, DO       Or  . ondansetron (ZOFRAN) injection 4 mg  4 mg Intravenous Q6H PRN Hugelmeyer, Alexis, DO   4 mg at 01/31/21 0004  . pantoprazole (PROTONIX) injection 40 mg  40 mg Intravenous Q12H Hugelmeyer, Alexis, DO   40 mg at 01/31/21 0005  . tiZANidine (ZANAFLEX) tablet 2 mg  2 mg Oral BID PRN Hugelmeyer, Alexis, DO      .  traZODone (DESYREL) tablet 25 mg  25 mg Oral QHS PRN Hugelmeyer, Alexis, DO        Allergies as of 01/30/2021 - Review Complete 01/30/2021  Allergen Reaction Noted  . Atorvastatin  07/28/2015    Family History  Problem Relation Age of Onset  . Cancer Father        lung, former smoker, [place] lung in [place]  . Dementia Mother   . Hemochromatosis Brother   . Colon cancer Neg Hx   . Esophageal cancer Neg Hx   . Rectal cancer Neg Hx   . Stomach cancer Neg Hx   . Colon polyps Neg Hx     Social History   Socioeconomic History  . Marital status: Married    Spouse name: Not on file  . Number of children: Not on file  . Years of education: Not on file  . Highest education level: Not on file  Occupational History  . Not on file  Tobacco Use  . Smoking status: Former Smoker    Packs/day: 1.00    Years: 15.00    Pack years: 15.00    Types: Cigarettes    Quit date: 10/21/1978    Years since quitting: 42.3  . Smokeless tobacco: Never Used  Vaping Use  . Vaping Use: Never used  Substance and Sexual Activity  . Alcohol use: Yes    Alcohol/week: 14.0 standard drinks    Types: 14 Standard drinks or equivalent per week    Comment: 2 per day  . Drug use: No  . Sexual activity: Yes  Other Topics Concern  . Not on file  Social History Narrative   Married (wife pt of Dr. Maudie Mercury). 5 children (2 by first wife, 3 stepkids), 11 granddaughters + 2 greatgrandsons.       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 Worried About Charity fundraiser in the Last Year: Never true  . Ran Out of Food in the Last Year: Never true  Transportation Needs: No Transportation Needs  . Lack of Transportation (Medical): No  . Lack of Transportation (Non-Medical): No  Physical Activity: Not on file  Stress: Not on file  Social Connections: Not on file  Intimate Partner Violence: Not on file    Review of Systems: Pertinent positive and negative review of systems were noted in the above HPI section.  All other review of systems was otherwise negative.  Physical Exam: Vital signs in last 24 hours: Temp:  [97.4 F (36.3 C)-98.3 F (36.8 C)] 97.4 F (36.3 C) (04/13 0410) Pulse Rate:  [68-87] 74 (04/13 0410) Resp:  [12-24] 16 (04/13 0410) BP: (126-170)/(65-129) 126/65 (04/13 0410) SpO2:  [94 %-98 %] 96 % (04/13 0410) Weight:  [132 kg] 108 kg (04/12 1808) Last BM Date: 01/31/21 General:   Alert,  Well-developed, older white male well-nourished, pleasant and cooperative in NAD at bedside Head:  Normocephalic and atraumatic. Eyes:  Sclera clear, no icterus.   Conjunctiva pink. Ears:  Normal auditory acuity. Nose:  No deformity, discharge,  or lesions. Mouth:  No deformity or lesions.   Neck:  Supple; no masses or thyromegaly. Lungs:  Clear throughout to auscultation.   No wheezes, crackles, or rhonchi. Heart:  Regular rate and rhythm; no  murmurs, clicks, rubs,  or gallops. Abdomen:  Soft, very mildly tender in the right lower quadrant no guarding, BS active,nonpalp mass or hsm.   Rectal: Not done Msk:  Symmetrical without gross deformities. . Pulses:  Normal pulses noted. Extremities:  Without clubbing or edema. Neurologic:  Alert and  oriented x4;  grossly normal neurologically. Skin:  Intact without significant lesions or rashes.. Psych:  Alert and  cooperative. Normal mood and affect.  Intake/Output from previous day: 04/12 0701 - 04/13 0700 In: 368.3 [I.V.:368.3] Out: -  Intake/Output this shift: No intake/output data recorded.  Lab Results: Recent Labs    01/30/21 2119 01/31/21 0050 01/31/21 0434  WBC 10.9* 10.7* 9.5  9.7  HGB 12.8* 12.2* 12.0*  12.0*  HCT 36.8* 36.2* 34.3*  34.8*  PLT 202 188 194  196   BMET Recent Labs    01/30/21 1833 01/31/21 0050  NA 138 135  K 3.9 4.3  CL 106 106  CO2 22 19*  GLUCOSE 101* 137*  BUN 18 19  CREATININE 0.94 1.14  CALCIUM 9.2 8.4*   LFT Recent Labs    01/31/21 0050  PROT 6.4*  ALBUMIN 3.5  AST 36  ALT 47*  ALKPHOS 29*  BILITOT 1.3*     IMPRESSION:  80. 70 year old white male with history of adenomatous colon polyps status post colonoscopy yesterday with removal of 8 polyps.  He had 2 larger polyps one in the cecum and one in the ascending colon which was the largest and 25 mm and removed piecemeal with hot snare. Patient developed acute bleeding with bright red blood per rectum several hours later.  He has remained hemodynamically stable but had another grossly bloody bowel movement about 30 minutes ago. Hemoglobin down 2 g since onset.  Bleeding consistent with post polypectomy hemorrhage from one of the right colon polyp sites  2.  Mild ABL anemia secondary to above 3.  History of hypertension 4.  GERD 5.  Hyperlipidemia 6.  Diverticulosis  Plan:  Keep n.p.o. Will be scheduled for colonoscopy with Dr. Fuller Plan this afternoon for hemostasis Procedure was discussed in detail with the patient and his wife including indications risks and benefits and they are agreeable to proceed. We will start movie prep now, plan half prep Stat hemoglobin and continue to 6-hour hemoglobins and transfuse as indicated   Amy EsterwoodPA-C  01/31/2021, 8:31 AM      Attending Physician Note   I have taken a history, examined the patient and reviewed the chart. I agree  with the Advanced Practitioner's note, impression and recommendations.  Acute LGI bleed post polypectomy. CT AP indicates bleeding in ascending colon, very likely the cecal or ascending colon polypectomy site. Half bowel prep and colonoscopy today. Trend CBC.   Lucio Edward, MD FACG [phone]

## 2021-01-31 NOTE — Op Note (Addendum)
[facility] Patient Name: Larry Williamson Procedure Date: 01/31/2021 MRN: [id number] Attending MD: Ladene Artist , MD Date of Birth: July 22, 1951 CSN: [id number] Age: 70 Admit Type: Inpatient Procedure:                Colonoscopy Indications:              Hematochezia. Colonoscopy with polypectomies                            yesterday. CT scan indicates bleeding in ascending                            colon. Providers:                Pricilla Riffle. Fuller Plan, MD, Benay Pillow, RN, Elspeth Cho Tech., Technician, Courtney Heys. [place], CRNA Referring MD:             [facility] Medicines:                Monitored Anesthesia Care Complications:            No immediate complications. Estimated blood loss:                            None. Estimated Blood Loss:     Estimated blood loss: none. Procedure:                Pre-Anesthesia Assessment:                           - Prior to the procedure, a History and Physical                            was performed, and patient medications and                            allergies were reviewed. The patient's tolerance of                            previous anesthesia was also reviewed. The risks                            and benefits of the procedure and the sedation                            options and risks were discussed with the patient.                            All questions were answered, and informed consent                            was obtained. Prior Anticoagulants: The patient has                            taken no previous anticoagulant  or antiplatelet                            agents. ASA Grade Assessment: II - A patient with                            mild systemic disease. After reviewing the risks                            and benefits, the patient was deemed in                            satisfactory condition to undergo the procedure.                           After obtaining informed  consent, the colonoscope                            was passed under direct vision. Throughout the                            procedure, the patient's blood pressure, pulse, and                            oxygen saturations were monitored continuously. The                            CF-HQ190L ([id number] Olympus colonoscope was                            introduced through the anus and advanced to the the                            terminal ileum, with identification of the                            appendiceal orifice and IC valve. The ileocecal                            valve, appendiceal orifice, and rectum were                            photographed. The quality of the bowel preparation                            was good. The colonoscopy was performed without                            difficulty. The patient tolerated the procedure                            well. Scope In: 1:53:56 PM Scope Out: 2:15:16 PM Scope Withdrawal Time: 0 hours 18 minutes 35 seconds  Total Procedure Duration: 0 hours 21 minutes 20 seconds  Findings:  The perianal and digital rectal examinations were normal.      A single (solitary) 15 mm ulcer was found in the cecum. No bleeding was       present. The ulcer was clean based. No stigmata of recent bleeding were       seen.      A single (solitary) 25 mm ulcer was found in the proximal ascending       colon. No bleeding was present. Stigmata of recent bleeding were present       with 4 visible vessels. For hemostasis, five hemostatic clips were       successfully placed (MR conditional). One clip was inadventently placed       adjacent to the site. The visible vessels were all clipped and there was       no bleeding during the procedure.      Six 8-10 mm superficial ulcers were found in the transverse colon and in       the ascending colon. No bleeding was present. Stigmata of recent       bleeding were present, a few had small adherent clots.       Multiple medium-mouthed diverticula were found in the left colon. There       was evidence of diverticular spasm. There was evidence of an impacted       diverticulum. There was no evidence of diverticular bleeding.      The exam was otherwise without abnormality on direct and retroflexion       views. Impression:               - A single (solitary) ulcer in the cecum at                            piecemeal hot snare polyectomy site.                           - A single (solitary) ulcer in the proximal                            ascending colon with stigmata of recent bleed at                            piecemeal hot snare polypectomy site. Clips (MR                            conditional) were placed.                           - Six superficial ulcers in the transverse colon                            and in the ascending colon at the cold snare                            polypectomy sites, a few with small adherent clots.                           - Moderate left colon diverticulosis.                           -  The examination was otherwise normal on direct                            and retroflexion views.                           - No specimens collected. Moderate Sedation:      Not Applicable - Patient had care per Anesthesia. Recommendation:           - Repeat colonoscopy in 6 months for surveillance                            after piecemeal polypectomy.                           - Return to hospital ward for ongoing care.                           - Advance diet to full liquids and to soft diet                            later today or tomorrow morning.                           - Continue present medications.                           - Await pathology results.                           - If stable overnight anticipate discharge tomorrow                            morning.                           - No strenuous activities for 3 weeks.                           - No aspirin,  ibuprofen, naproxen, or other                            non-steroidal anti-inflammatory drugs for 3 weeks. Procedure Code(s):        --- Professional ---                           [id number], Colonoscopy, flexible; with control of                            bleeding, any method Diagnosis Code(s):        --- Professional ---                           K63.3, Ulcer of intestine                           K92.1, Melena (includes Hematochezia)  K57.30, Diverticulosis of large intestine without                            perforation or abscess without bleeding CPT copyright 2019 [employer]. All rights reserved. The codes documented in this report are preliminary and upon coder review may  be revised to meet current compliance requirements. Ladene Artist, MD 01/31/2021 2:38:49 PM This report has been signed electronically. Number of Addenda: 0

## 2021-01-31 NOTE — Consult Note (Addendum)
Consultation  Referring Provider: TRH/ GonfaPrimary Care Physician:  Marin Olp, MD Primary Gastroenterologist:  Dr Fuller Plan .  Reason for Consultation:  GI bleed  HPI: Larry Williamson is a 70 y.o. male, known to Dr. Fuller Plan with history of adenomatous polyps.  Patient underwent colonoscopy yesterday in the [facility] with finding of total of 8 polyps all of which were removed.  He had a 25 mm polyp in the ascending colon and a 15 mm polyp in the cecum both of which were removed with hot snare. There were 6 polyps in the ascending and transverse colon, smaller and removed with cold snare.  Also noted to have multiple diverticuli. Plan was for follow-up colonoscopy in 6 months to reassess the polyp site from the larger 2 polyps.  Patient is not anticoagulated.  He went home and in about 3 PM yesterday passed a large amount of bright red blood.  He was instructed to come to the emergency room for evaluation.  He had 2 other episodes yesterday afternoon night with dark and red blood.  1 of these episodes was associated with diaphoresis and dizziness.  Patient did not have any bowel movements for several hours and then this morning about 7:30 AM he says he had another bowel movement and filled the bowl with blood.  He is feeling okay currently.  He says he has some mild soreness in the right lower quadrant.  Labs hemoglobin 14 on arrival, 12.2 last evening and 12 at 4:30 this a.m. CT abdomen pelvis last night-hyperdense focus within the ascending colon cranial to the ileocecal junction consistent with an area of active bleeding/extravasation  Covid test negative He has been hemodynamically stable  Other medical problems include GERD, hypertension and hyperlipidemia.   Past Medical History:  Diagnosis Date  . Adenomatous colon polyp 06/2006  . ALLERGIC RHINITIS   . Allergy    seasonal allergies  . Anemia    hx of  . ANEMIA DUE TO DIETARY IRON DEFICIENCY 05/28/2007   Due to giving  regularly giving blood. Resolved with cutting in half.     . COMPRESSION FRACTURE, THORACIC VERTEBRA 03/09/2008   2009, no chronic pain   . Diverticulitis of colon   . DIVERTICULOSIS, COLON 05/28/2007   Qualifier: Diagnosis of  By: Arnoldo Morale MD, Balinda Quails   . GERD (gastroesophageal reflux disease)    on meds  . Hyperlipidemia    on meds  . Hypertension    on meds  . Melanoma ([place]) 02/23/2014   MM in situe, early, low mid back, exc  . Squamous cell carcinoma in situ 11/10/2018   Top of right shoulder, CX3, 5FU  . Squamous cell carcinoma of skin    SCC IN SITU TOP OF RIGHT SHOULDER TX CX3 5FU  . Ulcer 1992   gastric    Past Surgical History:  Procedure Laterality Date  . COLONOSCOPY  01/2017   hx polyps/tics/hems  . COLONOSCOPY  2019   [facility]-TICS/int hems/3 yr recall-  . COLONOSCOPY W/ POLYPECTOMY    . FOOT SURGERY Right    pain scraper several inches into foot  . POLYPECTOMY  2018   piecemeal polyps TAx 2  . WISDOM TOOTH EXTRACTION      Prior to Admission medications   Medication Sig Start Date End Date Taking? Authorizing Provider  amLODipine (NORVASC) 2.5 MG tablet TAKE 1 TABLET(2.5 MG) BY MOUTH DAILY 10/05/20  Yes Marin Olp, MD  aspirin 81 MG tablet Take 81 mg by mouth  daily.   Yes [provider]  cetirizine (ZYRTEC) 10 MG tablet Take 10 mg by mouth daily.   Yes [provider]  gabapentin (NEURONTIN) 300 MG capsule Take 1 capsule (300 mg total) by mouth 2 (two) times daily. 01/23/21  Yes Magnus Sinning, MD  irbesartan-hydrochlorothiazide (AVALIDE) 300-12.5 MG tablet TAKE 1 TABLET BY MOUTH DAILY 07/14/20  Yes Marin Olp, MD  MULTIPLE VITAMIN PO Take 1 tablet by mouth daily.   Yes [provider]  Omega-3 Fatty Acids (FISH OIL) 1000 MG CAPS Take 2 capsules by mouth daily.   Yes [provider]  omeprazole (PRILOSEC) 40 MG capsule TAKE 1 CAPSULE BY MOUTH EVERY DAY 12/06/20  Yes Marin Olp, MD  rosuvastatin (CRESTOR) 5 MG  tablet TAKE 1 TABLET BY MOUTH once a week 07/06/20  Yes Marin Olp, MD  tadalafil (CIALIS) 10 MG tablet Take 5 mg by mouth daily as needed for erectile dysfunction. 06/26/17  Yes [provider]  tizanidine (ZANAFLEX) 2 MG capsule TAKE 1 CAPSULE(2 MG) BY MOUTH TWICE DAILY AS NEEDED FOR MUSCLE SPASMS 02/25/20  Yes Marin Olp, MD  Cyanocobalamin (B-12) 1000 MCG CAPS Take 1,000 mcg by mouth daily. Patient not taking: Reported on 01/30/2021 10/28/19   Marin Olp, MD  polyethylene glycol (GOLYTELY) 236 g solution Take 4,000 mLs by mouth as directed. Patient not taking: Reported on 01/30/2021 01/16/21   Ladene Artist, MD  sildenafil (VIAGRA) 100 MG tablet Take 1 tablet (100 mg total) by mouth daily as needed for erectile dysfunction. Patient not taking: Reported on 01/30/2021 10/26/18   Marin Olp, MD    Current Facility-Administered Medications  Medication Dose Route Frequency Provider Last Rate Last Admin  . 0.9 %  sodium chloride infusion   Intravenous Continuous Hugelmeyer, Alexis, DO 75 mL/hr at 01/31/21 0004 New Bag at 01/31/21 0004  . acetaminophen (TYLENOL) tablet 650 mg  650 mg Oral Q6H PRN Hugelmeyer, Alexis, DO       Or  . acetaminophen (TYLENOL) suppository 650 mg  650 mg Rectal Q6H PRN Hugelmeyer, Alexis, DO      . amLODipine (NORVASC) tablet 2.5 mg  2.5 mg Oral Daily Hugelmeyer, Alexis, DO      . gabapentin (NEURONTIN) capsule 300 mg  300 mg Oral BID Hugelmeyer, Alexis, DO   300 mg at 01/31/21 0005  . irbesartan (AVAPRO) tablet 300 mg  300 mg Oral Daily Hugelmeyer, Alexis, DO       And  . hydrochlorothiazide (MICROZIDE) capsule 12.5 mg  12.5 mg Oral Daily Hugelmeyer, Alexis, DO      . ipratropium-albuterol (DUONEB) 0.5-2.5 (3) MG/3ML nebulizer solution 3 mL  3 mL Nebulization Q6H PRN Hugelmeyer, Alexis, DO      . loratadine (CLARITIN) tablet 10 mg  10 mg Oral Daily Hugelmeyer, Alexis, DO      . morphine 2 MG/ML injection 1 mg  1 mg Intravenous Q6H PRN  Hugelmeyer, Alexis, DO      . ondansetron (ZOFRAN) tablet 4 mg  4 mg Oral Q6H PRN Hugelmeyer, Alexis, DO       Or  . ondansetron (ZOFRAN) injection 4 mg  4 mg Intravenous Q6H PRN Hugelmeyer, Alexis, DO   4 mg at 01/31/21 0004  . pantoprazole (PROTONIX) injection 40 mg  40 mg Intravenous Q12H Hugelmeyer, Alexis, DO   40 mg at 01/31/21 0005  . tiZANidine (ZANAFLEX) tablet 2 mg  2 mg Oral BID PRN Hugelmeyer, Alexis, DO      .  traZODone (DESYREL) tablet 25 mg  25 mg Oral QHS PRN Hugelmeyer, Alexis, DO        Allergies as of 01/30/2021 - Review Complete 01/30/2021  Allergen Reaction Noted  . Atorvastatin  07/28/2015    Family History  Problem Relation Age of Onset  . Cancer Father        lung, former smoker, [place] lung in [place]  . Dementia Mother   . Hemochromatosis Brother   . Colon cancer Neg Hx   . Esophageal cancer Neg Hx   . Rectal cancer Neg Hx   . Stomach cancer Neg Hx   . Colon polyps Neg Hx     Social History   Socioeconomic History  . Marital status: Married    Spouse name: Not on file  . Number of children: Not on file  . Years of education: Not on file  . Highest education level: Not on file  Occupational History  . Not on file  Tobacco Use  . Smoking status: Former Smoker    Packs/day: 1.00    Years: 15.00    Pack years: 15.00    Types: Cigarettes    Quit date: 10/21/1978    Years since quitting: 42.3  . Smokeless tobacco: Never Used  Vaping Use  . Vaping Use: Never used  Substance and Sexual Activity  . Alcohol use: Yes    Alcohol/week: 14.0 standard drinks    Types: 14 Standard drinks or equivalent per week    Comment: 2 per day  . Drug use: No  . Sexual activity: Yes  Other Topics Concern  . Not on file  Social History Narrative   Married (wife pt of Dr. Maudie Mercury). 5 children (2 by first wife, 3 stepkids), 11 granddaughters + 2 greatgrandsons.       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 Worried About Charity fundraiser in the Last Year: Never true  . Ran Out of Food in the Last Year: Never true  Transportation Needs: No Transportation Needs  . Lack of Transportation (Medical): No  . Lack of Transportation (Non-Medical): No  Physical Activity: Not on file  Stress: Not on file  Social Connections: Not on file  Intimate Partner Violence: Not on file    Review of Systems: Pertinent positive and negative review of systems were noted in the above HPI section.  All other review of systems was otherwise negative.  Physical Exam: Vital signs in last 24 hours: Temp:  [97.4 F (36.3 C)-98.3 F (36.8 C)] 97.4 F (36.3 C) (04/13 0410) Pulse Rate:  [68-87] 74 (04/13 0410) Resp:  [12-24] 16 (04/13 0410) BP: (126-170)/(65-129) 126/65 (04/13 0410) SpO2:  [94 %-98 %] 96 % (04/13 0410) Weight:  [412 kg] 108 kg (04/12 1808) Last BM Date: 01/31/21 General:   Alert,  Well-developed, older white male well-nourished, pleasant and cooperative in NAD at bedside Head:  Normocephalic and atraumatic. Eyes:  Sclera clear, no icterus.   Conjunctiva pink. Ears:  Normal auditory acuity. Nose:  No deformity, discharge,  or lesions. Mouth:  No deformity or lesions.   Neck:  Supple; no masses or thyromegaly. Lungs:  Clear throughout to auscultation.   No wheezes, crackles, or rhonchi. Heart:  Regular rate and rhythm; no  murmurs, clicks, rubs,  or gallops. Abdomen:  Soft, very mildly tender in the right lower quadrant no guarding, BS active,nonpalp mass or hsm.   Rectal: Not done Msk:  Symmetrical without gross deformities. . Pulses:  Normal pulses noted. Extremities:  Without clubbing or edema. Neurologic:  Alert and  oriented x4;  grossly normal neurologically. Skin:  Intact without significant lesions or rashes.. Psych:  Alert and  cooperative. Normal mood and affect.  Intake/Output from previous day: 04/12 0701 - 04/13 0700 In: 368.3 [I.V.:368.3] Out: -  Intake/Output this shift: No intake/output data recorded.  Lab Results: Recent Labs    01/30/21 2119 01/31/21 0050 01/31/21 0434  WBC 10.9* 10.7* 9.5  9.7  HGB 12.8* 12.2* 12.0*  12.0*  HCT 36.8* 36.2* 34.3*  34.8*  PLT 202 188 194  196   BMET Recent Labs    01/30/21 1833 01/31/21 0050  NA 138 135  K 3.9 4.3  CL 106 106  CO2 22 19*  GLUCOSE 101* 137*  BUN 18 19  CREATININE 0.94 1.14  CALCIUM 9.2 8.4*   LFT Recent Labs    01/31/21 0050  PROT 6.4*  ALBUMIN 3.5  AST 36  ALT 47*  ALKPHOS 29*  BILITOT 1.3*     IMPRESSION:  32. 70 year old white male with history of adenomatous colon polyps status post colonoscopy yesterday with removal of 8 polyps.  He had 2 larger polyps one in the cecum and one in the ascending colon which was the largest and 25 mm and removed piecemeal with hot snare. Patient developed acute bleeding with bright red blood per rectum several hours later.  He has remained hemodynamically stable but had another grossly bloody bowel movement about 30 minutes ago. Hemoglobin down 2 g since onset.  Bleeding consistent with post polypectomy hemorrhage from one of the right colon polyp sites  2.  Mild ABL anemia secondary to above 3.  History of hypertension 4.  GERD 5.  Hyperlipidemia 6.  Diverticulosis  Plan:  Keep n.p.o. Will be scheduled for colonoscopy with Dr. Fuller Plan this afternoon for hemostasis Procedure was discussed in detail with the patient and his wife including indications risks and benefits and they are agreeable to proceed. We will start movie prep now, plan half prep Stat hemoglobin and continue to 6-hour hemoglobins and transfuse as indicated   Amy EsterwoodPA-C  01/31/2021, 8:31 AM      Attending Physician Note   I have taken a history, examined the patient and reviewed the chart. I agree  with the Advanced Practitioner's note, impression and recommendations.  Acute LGI bleed post polypectomy. CT AP indicates bleeding in ascending colon, very likely the cecal or ascending colon polypectomy site. Half bowel prep and colonoscopy today. Trend CBC.   Lucio Edward, MD FACG [phone]

## 2021-01-31 NOTE — Interval H&P Note (Signed)
History and Physical Interval Note:  01/31/2021 1:49 PM  Larry Williamson  has presented today for surgery, with the diagnosis of GI bleed post polypectomy.  The various methods of treatment have been discussed with the patient and family. After consideration of risks, benefits and other options for treatment, the patient has consented to  Procedure(s): COLONOSCOPY WITH PROPOFOL (N/A) as a surgical intervention.  The patient's history has been reviewed, patient examined, no change in status, stable for surgery.  I have reviewed the patient's chart and labs.  Questions were answered to the patient's satisfaction.     Pricilla Riffle. Fuller Plan

## 2021-01-31 NOTE — Transfer of Care (Signed)
Immediate Anesthesia Transfer of Care Note  Patient: Larry Williamson  Procedure(s) Performed: COLONOSCOPY WITH PROPOFOL (N/A ) HEMOSTASIS CLIP PLACEMENT  Patient Location: PACU and Endoscopy Unit  Anesthesia Type:MAC  Level of Consciousness: awake, alert , oriented and patient cooperative  Airway & Oxygen Therapy: Patient Spontanous Breathing and Patient connected to face mask oxygen  Post-op Assessment: Report given to RN, Post -op Vital signs reviewed and stable and Patient moving all extremities  Post vital signs: Reviewed and stable  Last Vitals:  Vitals Value Taken Time  BP    Temp    Pulse 67 01/31/21 1421  Resp 15 01/31/21 1421  SpO2 100 % 01/31/21 1421  Vitals shown include unvalidated device data.  Last Pain:  Vitals:   01/31/21 1256  TempSrc: Temporal  PainSc: 0-No pain         Complications: No complications documented.

## 2021-01-31 NOTE — Anesthesia Postprocedure Evaluation (Signed)
Anesthesia Post Note  Patient: Larry Williamson  Procedure(s) Performed: COLONOSCOPY WITH PROPOFOL (N/A ) HEMOSTASIS CLIP PLACEMENT     Patient location during evaluation: Endoscopy Anesthesia Type: MAC Level of consciousness: awake and alert and awake Pain management: pain level controlled Vital Signs Assessment: post-procedure vital signs reviewed and stable Respiratory status: spontaneous breathing, nonlabored ventilation, respiratory function stable and patient connected to nasal cannula oxygen Cardiovascular status: stable and blood pressure returned to baseline Postop Assessment: no apparent nausea or vomiting Anesthetic complications: no   No complications documented.  Last Vitals:  Vitals:   01/31/21 1500 01/31/21 1515  BP: (!) 138/57 119/66  Pulse: 62 66  Resp: 18 17  Temp:    SpO2: 99% 99%    Last Pain:  Vitals:   01/31/21 1515  TempSrc:   PainSc: 0-No pain                 Catalina Gravel

## 2021-01-31 NOTE — Progress Notes (Addendum)
PROGRESS NOTE  Larry Williamson BPZ:[id number] DOB: 03-29-1951   PCP: Marin Olp, MD  Patient is from: Home  DOA: 01/30/2021 LOS: 0  Chief complaints: Rectal bleed  Brief Narrative / Interim history: 70 year old male with PMH of colon polyps, IDA, diverticulosis, GERD, HTN and HLD presenting with rectal bleed after colonoscopy and polypectomy with removal of 8 polyps earlier the day of admission, and admitted for hematochezia/rectal bleed.  Hgb 14 on admit>>> 12 and remained stable since then.  Patient underwent repeat colonoscopy on 4/13 that showed ulcers from polypectomy site without active bleed and moderate left colon diverticulosis.  GI recommended observation overnight on full liquid diet.  Subjective: Seen and examined earlier this morning before he went down for his colonoscopy.  No further bowel movement but passing gas.  No further GI bleed.  Denies chest pain, dyspnea, lightheadedness, nausea, vomiting or abdominal pain.  Objective: Vitals:   01/31/21 1440 01/31/21 1450 01/31/21 1500 01/31/21 1515  BP: 123/66 (!) 140/59 (!) 138/57 119/66  Pulse: 76 68 62 66  Resp: (!) 24 20 18 17   Temp:      TempSrc:      SpO2: 100% 98% 99% 99%  Weight:      Height:        Intake/Output Summary (Last 24 hours) at 01/31/2021 1534 Last data filed at 01/31/2021 1421 Gross per 24 hour  Intake 1575.62 ml  Output 0 ml  Net 1575.62 ml   Filed Weights   01/30/21 1808  Weight: 108 kg    Examination:  GENERAL: No apparent distress.  Nontoxic. HEENT: MMM.  Vision and hearing grossly intact.  NECK: Supple.  No apparent JVD.  RESP:  No IWOB.  Fair aeration bilaterally. CVS:  RRR. Heart sounds normal.  ABD/GI/GU: BS+. Abd soft, NTND.  MSK/EXT:  Moves extremities. No apparent deformity. No edema.  SKIN: no apparent skin lesion or wound NEURO: Awake, alert and oriented appropriately.  No apparent focal neuro deficit. PSYCH: Calm. Normal affect.  Procedures:   4/13-colonoscopy showed ulcers from polypectomy site without active bleed and moderate left colon diverticulosis.    Microbiology summarized: COVID-19 PCR nonreactive.  Assessment & Plan: Acute blood loss anemia due to post polypectomy bleed/hematochezia-seems to have resolved.  Repeat colonoscopy as above. Recent Labs    01/30/21 1833 01/30/21 2119 01/31/21 0050 01/31/21 0434 01/31/21 0921 01/31/21 1118  HGB 14.0 12.8* 12.2* 12.0*  12.0* 12.8* 12.9*  -GI recommendations:  -Observation on full liquid diet overnight and discharge in the morning if stable  -No strenuous activities, aspirin or NSAIDs for 3 weeks  -Repeat colonoscopy in 6 months for surveillance -Discontinue PPI.  Doubt utility for lower GI bleed -Discontinue IV fluid -Check anemia panel in the morning  Essential hypertension: Normotensive. -Continue amlodipine, Avalide  Hyperlipidemia -Continue Crestor  History of seasonal allergies -Claritin  Chronic pain -Continue Neurontin, Zanaflex  Body mass index is 35.15 kg/m.         DVT prophylaxis:  SCDs Start: 01/30/21 2340  Code Status: Full code Family Communication: Updated patient's wife at the bedside. Level of care: Med-Surg Status is: Observation  The patient will require care spanning > 2 midnights and should be moved to inpatient because: Ongoing diagnostic testing needed not appropriate for outpatient work up and Inpatient level of care appropriate due to severity of illness  Dispo: The patient is from: Home              Anticipated d/c is to: Home  Patient currently is not medically stable to d/c.   Difficult to place patient No       Consultants:  Gastroenterology   Sch Meds:  Scheduled Meds: . amLODipine  2.5 mg Oral Daily  . gabapentin  300 mg Oral BID  . irbesartan  300 mg Oral Daily   And  . hydrochlorothiazide  12.5 mg Oral Daily  . loratadine  10 mg Oral Daily  . pantoprazole (PROTONIX) IV  40 mg  Intravenous Q12H   Continuous Infusions: . sodium chloride 75 mL/hr at 01/31/21 0004   PRN Meds:.acetaminophen **OR** acetaminophen, ipratropium-albuterol, morphine injection, ondansetron **OR** ondansetron (ZOFRAN) IV, tiZANidine, traZODone  Antimicrobials: Anti-infectives (From admission, onward)   None       I have personally reviewed the following labs and images: CBC: Recent Labs  Lab 01/30/21 1833 01/30/21 2119 01/31/21 0050 01/31/21 0434 01/31/21 0921 01/31/21 1118  WBC 11.0* 10.9* 10.7* 9.5  9.7  --  9.6  NEUTROABS  --  6.9  --   --   --   --   HGB 14.0 12.8* 12.2* 12.0*  12.0* 12.8* 12.9*  HCT 39.4 36.8* 36.2* 34.3*  34.8* 36.9* 37.2*  MCV 87.8 89.3 92.1 89.3  90.2  --  91.6  PLT 204 202 188 194  196  --  203   BMP &GFR Recent Labs  Lab 01/30/21 1833 01/31/21 0050  NA 138 135  K 3.9 4.3  CL 106 106  CO2 22 19*  GLUCOSE 101* 137*  BUN 18 19  CREATININE 0.94 1.14  CALCIUM 9.2 8.4*   Estimated Creatinine Clearance: 73 mL/min (by C-G formula based on SCr of 1.14 mg/dL). Liver & Pancreas: Recent Labs  Lab 01/30/21 1833 01/31/21 0050  AST 45* 36  ALT 56* 47*  ALKPHOS 34* 29*  BILITOT 1.3* 1.3*  PROT 7.3 6.4*  ALBUMIN 4.1 3.5   No results for input(s): LIPASE, AMYLASE in the last 168 hours. No results for input(s): AMMONIA in the last 168 hours. Diabetic: No results for input(s): HGBA1C in the last 72 hours. No results for input(s): GLUCAP in the last 168 hours. Cardiac Enzymes: No results for input(s): CKTOTAL, CKMB, CKMBINDEX, TROPONINI in the last 168 hours. No results for input(s): PROBNP in the last 8760 hours. Coagulation Profile: No results for input(s): INR, PROTIME in the last 168 hours. Thyroid Function Tests: No results for input(s): TSH, T4TOTAL, FREET4, T3FREE, THYROIDAB in the last 72 hours. Lipid Profile: No results for input(s): CHOL, HDL, LDLCALC, TRIG, CHOLHDL, LDLDIRECT in the last 72 hours. Anemia Panel: No results  for input(s): VITAMINB12, FOLATE, FERRITIN, TIBC, IRON, RETICCTPCT in the last 72 hours. Urine analysis:    Component Value Date/Time   COLORURINE YELLOW 10/26/2018 0926   APPEARANCEUR CLEAR 10/26/2018 0926   LABSPEC 1.015 10/26/2018 0926   PHURINE 6.0 10/26/2018 0926   GLUCOSEU NEGATIVE 10/26/2018 0926   HGBUR NEGATIVE 10/26/2018 0926   HGBUR negative 12/04/2009 0817   BILIRUBINUR NEGATIVE 10/26/2018 0926   BILIRUBINUR neg 09/26/2016 1103   KETONESUR NEGATIVE 10/26/2018 0926   PROTEINUR neg 09/26/2016 1103   UROBILINOGEN 0.2 10/26/2018 0926   NITRITE NEGATIVE 10/26/2018 0926   LEUKOCYTESUR NEGATIVE 10/26/2018 0926   Sepsis Labs: Invalid input(s): PROCALCITONIN, [place]  Microbiology: Recent Results (from the past 240 hour(s))  SARS CORONAVIRUS 2 (TAT 6-24 HRS) Nasopharyngeal Nasopharyngeal Swab     Status: None   Collection Time: 01/30/21 11:04 PM   Specimen: Nasopharyngeal Swab  Result Value Ref Range Status  SARS Coronavirus 2 NEGATIVE NEGATIVE Final    Comment: (NOTE) SARS-CoV-2 target nucleic acids are NOT DETECTED.  The SARS-CoV-2 RNA is generally detectable in upper and lower respiratory specimens during the acute phase of infection. Negative results do not preclude SARS-CoV-2 infection, do not rule out co-infections with other pathogens, and should not be used as the sole basis for treatment or other patient management decisions. Negative results must be combined with clinical observations, patient history, and epidemiological information. The expected result is Negative.  Fact Sheet for Patients: SugarRoll.be  Fact Sheet for Healthcare Providers: https://www.woods-mathews.com/  This test is not yet approved or cleared by the [place] FDA and  has been authorized for detection and/or diagnosis of SARS-CoV-2 by FDA under an Emergency Use Authorization (EUA). This EUA will remain  in effect (meaning this test  can be used) for the duration of the COVID-19 declaration under Se ction 564(b)(1) of the Act, 21 U.S.C. section 360bbb-3(b)(1), unless the authorization is terminated or revoked sooner.  Performed at [facility], [place] [address] [place], [place] [address]     Radiology Studies: CT Abdomen Pelvis W Contrast  Result Date: 01/30/2021 CLINICAL DATA:  Dark red blood after colonoscopy right lower abdominal pain EXAM: CT ABDOMEN AND PELVIS WITH CONTRAST TECHNIQUE: Multidetector CT imaging of the abdomen and pelvis was performed using the standard protocol following bolus administration of intravenous contrast. CONTRAST:  174mL OMNIPAQUE IOHEXOL 300 MG/ML  SOLN COMPARISON:  None. FINDINGS: Lower chest: Lung bases demonstrate no acute consolidation or effusion. Minimal foci of subpleural ground-glass density suggesting mild fibrosis. Cardiac size within normal limits. Hepatobiliary: Hepatic steatosis with probable sparing near the gallbladder fossa. No calcified stones. No biliary dilatation Pancreas: Unremarkable. No pancreatic ductal dilatation or surrounding inflammatory changes. Spleen: Normal in size without focal abnormality. Adrenals/Urinary Tract: Adrenal glands are normal. Kidneys show no hydronephrosis. The bladder is thick walled but suboptimally distended. Stomach/Bowel: The stomach is nonenlarged. No dilated small bowel. Left colon diverticular disease without acute inflammatory thickening. Hyperdense focus within the ascending colon, coronal series 5, image 57, just distal to the ileocecal junction. This changes in shape and attenuation on delayed views and would be consistent with an area of active bleeding/extravasation. Vascular/Lymphatic: Mild aortic atherosclerosis. No aneurysm. No suspicious nodes Reproductive: Prostate is unremarkable. Other: Negative for free air or free fluid. Fat within the inguinal canals. Small fat containing umbilical hernia. Musculoskeletal: No acute or  significant osseous findings. Degenerative changes most advanced at L5-S1. IMPRESSION: 1. Hyperdense focus within the ascending colon just cranial to the ileocecal junction, consistent with an area of active bleeding/extravasation. 2. Left colon diverticular disease without acute inflammatory thickening. 3. Hepatic steatosis. 4. Thick-walled urinary bladder likely due to under distension, though could correlate with urinalysis to exclude cystitis. Critical Value/emergent results were called by telephone at the time of interpretation on 01/30/2021 at 8:35 pm to provider [facility] , who verbally acknowledged these results. Aortic Atherosclerosis (ICD10-I70.0). Electronically Signed   By: Donavan Foil M.D.   On: 01/30/2021 20:35       Eltha Tingley T. Charmwood  If 7PM-7AM, please contact night-coverage [email or web address] 01/31/2021, 3:34 PM

## 2021-01-31 NOTE — Anesthesia Preprocedure Evaluation (Signed)
Anesthesia Evaluation  Patient identified by MRN, date of birth, ID band Patient awake  General Assessment Comment:LGIB post polypectomy  Reviewed: Allergy & Precautions, NPO status , Patient's Chart, lab work & pertinent test results  Airway Mallampati: II  TM Distance: >3 FB Neck ROM: Full    Dental no notable dental hx.    Pulmonary neg pulmonary ROS, former smoker,    Pulmonary exam normal breath sounds clear to auscultation       Cardiovascular hypertension, Normal cardiovascular exam Rhythm:Regular Rate:Normal     Neuro/Psych negative neurological ROS  negative psych ROS   GI/Hepatic negative GI ROS, Neg liver ROS,   Endo/Other  negative endocrine ROS  Renal/GU negative Renal ROS  negative genitourinary   Musculoskeletal negative musculoskeletal ROS (+)   Abdominal   Peds negative pediatric ROS (+)  Hematology  (+) anemia ,   Anesthesia Other Findings   Reproductive/Obstetrics negative OB ROS                             Anesthesia Physical Anesthesia Plan  ASA: II  Anesthesia Plan: MAC   Post-op Pain Management:    Induction: Intravenous  PONV Risk Score and Plan: 1 and Propofol infusion and Treatment may vary due to age or medical condition  Airway Management Planned: Simple Face Mask  Additional Equipment:   Intra-op Plan:   Post-operative Plan:   Informed Consent: I have reviewed the patients History and Physical, chart, labs and discussed the procedure including the risks, benefits and alternatives for the proposed anesthesia with the patient or authorized representative who has indicated his/her understanding and acceptance.     Dental advisory given  Plan Discussed with: CRNA and Surgeon  Anesthesia Plan Comments:         Anesthesia Quick Evaluation

## 2021-02-01 ENCOUNTER — Telehealth: Payer: Self-pay

## 2021-02-01 ENCOUNTER — Telehealth: Payer: Self-pay | Admitting: *Deleted

## 2021-02-01 ENCOUNTER — Other Ambulatory Visit: Payer: Self-pay

## 2021-02-01 ENCOUNTER — Encounter (HOSPITAL_COMMUNITY): Payer: Self-pay | Admitting: Gastroenterology

## 2021-02-01 DIAGNOSIS — K922 Gastrointestinal hemorrhage, unspecified: Secondary | ICD-10-CM

## 2021-02-01 DIAGNOSIS — I10 Essential (primary) hypertension: Secondary | ICD-10-CM

## 2021-02-01 LAB — RETICULOCYTES
Immature Retic Fract: 16.1 % — ABNORMAL HIGH (ref 2.3–15.9)
RBC.: 3.35 MIL/uL — ABNORMAL LOW (ref 4.22–5.81)
Retic Count, Absolute: 118.6 10*3/uL (ref 19.0–186.0)
Retic Ct Pct: 3.5 % — ABNORMAL HIGH (ref 0.4–3.1)

## 2021-02-01 LAB — FOLATE: Folate: 22.2 ng/mL (ref >5.9)

## 2021-02-01 LAB — IRON AND TIBC
Iron: 75 ug/dL (ref 45–182)
Saturation Ratios: 25 % (ref 17.9–39.5)
TIBC: 297 ug/dL (ref 250–450)
UIBC: 222 ug/dL

## 2021-02-01 LAB — VITAMIN B12: Vitamin B-12: 311 pg/mL (ref 180–914)

## 2021-02-01 LAB — HEMOGLOBIN AND HEMATOCRIT, BLOOD
HCT: 31 % — ABNORMAL LOW (ref 39.0–52.0)
Hemoglobin: 10.6 g/dL — ABNORMAL LOW (ref 13.0–17.0)

## 2021-02-01 LAB — FERRITIN: Ferritin: 189 ng/mL (ref 24–336)

## 2021-02-01 MED ORDER — ASPIRIN 81 MG PO TABS: 81.0000 mg | ORAL_TABLET | Freq: Every day | ORAL | Status: DC

## 2021-02-01 NOTE — Telephone Encounter (Signed)
-----   Message from Alfredia Ferguson, PA-C sent at 02/01/2021 10:39 AM EDT ----- Regarding: labs Beth, patient is being discharged home today after a post polypectomy bleed with anemia  Please place order for him to have a CBC next week on 4/20 or 02/08/2021.  He says he has an appointment with Dr. Yong Channel on the 20th, and labs could be drawn at their office.  Put the order under my name, thank you

## 2021-02-01 NOTE — Progress Notes (Addendum)
Patient ID: Larry Williamson, male   DOB: [birth year], 70 y.o.   MRN: [id number]    Progress Note   Subjective   Day # 2  CC: GI bleed  Hemoglobin 10.9 last p.m., 10.6 today Ferritin 189  Iron studies normal  Patient says he feels great and is ready to go home.  He says he feels fine when he is up and around, had breakfast this morning.  He has been passing some gas but no bowel movement since the procedure.  No sense of urgency or unsettled feeling in his abdomen which she was having previously.  No complaints of abdominal pain or tenderness    Objective   Vital signs in last 24 hours: Temp:  [97 F (36.1 C)-98.5 F (36.9 C)] 98.5 F (36.9 C) (04/14 0453) Pulse Rate:  [62-78] 67 (04/14 0453) Resp:  [11-24] 16 (04/14 0453) BP: (108-157)/(50-77) 121/64 (04/14 0453) SpO2:  [97 %-100 %] 99 % (04/14 0453) Last BM Date: 01/31/21 General:    Older white male in NAD Heart:  Regular rate and rhythm; no murmurs Lungs: Respirations even and unlabored, lungs CTA bilaterally Abdomen:  Soft, nontender and nondistended. Normal bowel sounds. Extremities:  Without edema. Neurologic:  Alert and oriented,  grossly normal neurologically. Psych:  Cooperative. Normal mood and affect.  Intake/Output from previous day: 04/13 0701 - 04/14 0700 In: 1607.4 [P.O.:400; I.V.:1207.4] Out: 0  Intake/Output this shift: No intake/output data recorded.  Lab Results: Recent Labs    01/31/21 0434 01/31/21 0921 01/31/21 1118 01/31/21 1734 02/01/21 0419  WBC 9.5  9.7  --  9.6 7.5  --   HGB 12.0*  12.0*   < > 12.9* 10.9* 10.6*  HCT 34.3*  34.8*   < > 37.2* 32.4* 31.0*  PLT 194  196  --  203 174  --    < > = values in this interval not displayed.   BMET Recent Labs    01/30/21 1833 01/31/21 0050  NA 138 135  K 3.9 4.3  CL 106 106  CO2 22 19*  GLUCOSE 101* 137*  BUN 18 19  CREATININE 0.94 1.14  CALCIUM 9.2 8.4*   LFT Recent Labs    01/31/21 0050  PROT 6.4*  ALBUMIN 3.5  AST  36  ALT 47*  ALKPHOS 29*  BILITOT 1.3*    Studies/Results: CT Abdomen Pelvis W Contrast  Result Date: 01/30/2021 CLINICAL DATA:  Dark red blood after colonoscopy right lower abdominal pain EXAM: CT ABDOMEN AND PELVIS WITH CONTRAST TECHNIQUE: Multidetector CT imaging of the abdomen and pelvis was performed using the standard protocol following bolus administration of intravenous contrast. CONTRAST:  144mL OMNIPAQUE IOHEXOL 300 MG/ML  SOLN COMPARISON:  None. FINDINGS: Lower chest: Lung bases demonstrate no acute consolidation or effusion. Minimal foci of subpleural ground-glass density suggesting mild fibrosis. Cardiac size within normal limits. Hepatobiliary: Hepatic steatosis with probable sparing near the gallbladder fossa. No calcified stones. No biliary dilatation Pancreas: Unremarkable. No pancreatic ductal dilatation or surrounding inflammatory changes. Spleen: Normal in size without focal abnormality. Adrenals/Urinary Tract: Adrenal glands are normal. Kidneys show no hydronephrosis. The bladder is thick walled but suboptimally distended. Stomach/Bowel: The stomach is nonenlarged. No dilated small bowel. Left colon diverticular disease without acute inflammatory thickening. Hyperdense focus within the ascending colon, coronal series 5, image 57, just distal to the ileocecal junction. This changes in shape and attenuation on delayed views and would be consistent with an area of active bleeding/extravasation. Vascular/Lymphatic: Mild aortic atherosclerosis. No aneurysm.  No suspicious nodes Reproductive: Prostate is unremarkable. Other: Negative for free air or free fluid. Fat within the inguinal canals. Small fat containing umbilical hernia. Musculoskeletal: No acute or significant osseous findings. Degenerative changes most advanced at L5-S1. IMPRESSION: 1. Hyperdense focus within the ascending colon just cranial to the ileocecal junction, consistent with an area of active bleeding/extravasation. 2.  Left colon diverticular disease without acute inflammatory thickening. 3. Hepatic steatosis. 4. Thick-walled urinary bladder likely due to under distension, though could correlate with urinalysis to exclude cystitis. Critical Value/emergent results were called by telephone at the time of interpretation on 01/30/2021 at 8:35 pm to provider [facility] , who verbally acknowledged these results. Aortic Atherosclerosis (ICD10-I70.0). Electronically Signed   By: Donavan Foil M.D.   On: 01/30/2021 20:35       Assessment / Plan:    #22 70 year old white male with acute post polypectomy bleed status post colonoscopy with removal of 8 polyps on 01/30/2021. Colonoscopy yesterday with finding of visible vessels in the largest polyp in the proximal ascending colon, no active bleeding but had stigmata of recent bleeding.  5 endoclips were placed, a single clip was also placed that missed the vessels. The 15 mm ulcer in the cecum did not have any stigmata of recent bleeding.  Patient has been stable postprocedure with no evidence of further active bleeding.  Hemoglobin has drifted a bit which would be expected with hemostasis. Hemoglobin 10.6 this a.m.  Plan: Patient is stable from GI perspective to be discharged home today.  He has been instructed to take it easy, no strenuous activity, no heavy lifting, no yard work etc. over the next 3 weeks  No aspirin or NSAIDs over the next 3 weeks.  Tylenol is fine.  We will plan for patient to have follow-up hemoglobin next week at our office or Dr. Ansel Bong office.  Patient was advised to seek care in emergency room should he have any evidence of recurrent bleeding.  We will arrange follow-up with Dr. Fuller Plan for repeat colonoscopy in about 6 months.    LOS: 1 day   Amy Esterwood PA-C 02/01/2021, 8:57 AM      Attending Physician Note   I have taken an interval history, reviewed the chart and examined the patient. I agree with the Advanced Practitioner's note,  impression and recommendations. The patient's wife was present in the room. I addressed their questions to their satisfaction. Discharge home today.   Lucio Edward, MD FACG [phone]

## 2021-02-01 NOTE — Telephone Encounter (Signed)
Spoke with the lead nurse at [facility] where Dr Yong Channel is located [phone]) Okay for CBC on his appointment for his B12 injection on 02/08/21. Order entered with "clinic collect" as the lab with A Esterwood, PAC as provider.

## 2021-02-01 NOTE — Telephone Encounter (Signed)
Pt is currently hospitalized.

## 2021-02-01 NOTE — Progress Notes (Signed)
Assessment unchanged. Pt verbalized understanding of all dc instructions through teach back. Discharged via foot to front entrance accompanied by NT and wife.

## 2021-02-01 NOTE — Discharge Summary (Signed)
Physician Discharge Summary  Larry Williamson WGY:[id number] DOB: 06-29-1951 DOA: 01/30/2021  PCP: Marin Olp, MD  Admit date: 01/30/2021 Discharge date: 02/01/2021  Admitted From: Home Disposition: Home  Recommendations for Outpatient Follow-up:  1. Follow ups as below. 2. Please obtain CBC/BMP/Mag at follow up 3. Please follow up on the following pending results: None  Home Health: None Equipment/Devices: None  Discharge Condition: Stable CODE STATUS: Full code   Follow-up Information    Marin Olp, MD. Schedule an appointment as soon as possible for a visit in 1 week(s).   Specialty: Family Medicine Contact information: [address] [place] [address] [phone]               Hospital Course: 70 year old male with PMH of colon polyps, IDA, diverticulosis, GERD, HTN and HLD presenting with rectal bleed after colonoscopy and polypectomy with removal of 8 polyps earlier the day of admission, and admitted for hematochezia/rectal bleed.  Hgb 14 on admission.   Patient underwent repeat colonoscopy on 4/13 that showed ulcers from polypectomy site without active bleed and moderate left colon diverticulosis.  GI recommended observation overnight on full liquid diet.  Patient tolerated full liquid diet.  Has not had further GI bleed since hospitalization.  Hemoglobin dropped about 3 g but remained stable.  There is also some element of dilution as all cell lines went down.  Anemia panel without significant finding.  He was cleared for discharge by gastroenterology for outpatient follow-up.  He was instructed to avoid strenuous activity, aspirin or NSAIDs over the next 3 weeks.  He was also instructed to continue full liquid diet for the next 2 to 3 days and advance to soft diet then after.  Patient to follow-up with PCP and gastroenterology  See individual problem list below for more on hospital course.  Discharge Diagnoses:  Acute blood  loss anemia due to post polypectomy bleed/hematochezia-seems to have resolved.  Repeat colonoscopy as above.  Anemia panel normal. Recent Labs    01/30/21 1833 01/30/21 2119 01/31/21 0050 01/31/21 0434 01/31/21 0921 01/31/21 1118 01/31/21 1734 02/01/21 0419  HGB 14.0 12.8* 12.2* 12.0*  12.0* 12.8* 12.9* 10.9* 10.6*  -GI recommendations:             -No strenuous activities, aspirin or NSAIDs for 3 weeks             -Repeat colonoscopy in 6 months for surveillance -Discontinue PPI.  Doubt utility for lower GI bleed -Continue full liquid diet for the next 2 to 3 days then advance to soft diet  Essential hypertension: Normotensive. -Continue amlodipine, Avalide  Hyperlipidemia -Continue Crestor  History ofseasonal allergies -Claritin  Chronic pain -ContinueNeurontin, Zanaflex   Class II obesity Body mass index is 35.15 kg/m.  -Encourage lifestyle change to lose weight.          Discharge Exam: Vitals:   01/31/21 2128 02/01/21 0453  BP: 128/77 121/64  Pulse: 64 67  Resp: 16 16  Temp: 98.2 F (36.8 C) 98.5 F (36.9 C)  SpO2: 97% 99%    GENERAL: No apparent distress.  Nontoxic. HEENT: MMM.  Vision and hearing grossly intact.  NECK: Supple.  No apparent JVD.  RESP:  No IWOB.  Fair aeration bilaterally. CVS:  RRR. Heart sounds normal.  ABD/GI/GU: Bowel sounds present. Soft. Non tender.  MSK/EXT:  Moves extremities. No apparent deformity. No edema.  SKIN: no apparent skin lesion or wound NEURO: Awake, alert and oriented appropriately.  No apparent focal  neuro deficit. PSYCH: Calm. Normal affect.   Discharge Instructions  Discharge Instructions    Call MD for:   Complete by: As directed    Further rectal bleed or other symptoms concerning to you   Call MD for:  extreme fatigue   Complete by: As directed    Diet full liquid   Complete by: As directed    You may advance to soft diet after 2 to 3 days.   Discharge instructions   Complete by: As  directed    It has been a pleasure taking care of you!  You were hospitalized due to rectal bleeding after colonoscopy likely from where the polyps were removed.  It seems your bleeding has stopped and your hemoglobin has stabilized.  We recommend avoiding any strenuous activity, taking aspirin or any over-the-counter pain medication other than plain Tylenol for the next three weeks.  We also suggest continuing full liquid diet for the next 2 to 3 days, and advancing to soft diet after that. Please follow-up with your primary care doctor and/or gastroenterologist in 1 to 2 weeks or as recommended to you by your gastroenterologist.    Take care,   Increase activity slowly   Complete by: As directed      Allergies as of 02/01/2021      Reactions   Atorvastatin    Other reaction(s): Cramp      Medication List    STOP taking these medications   sildenafil 100 MG tablet Commonly known as: VIAGRA     TAKE these medications   amLODipine 2.5 MG tablet Commonly known as: NORVASC TAKE 1 TABLET(2.5 MG) BY MOUTH DAILY   aspirin 81 MG tablet Take 1 tablet (81 mg total) by mouth daily. Start taking on: Feb 22, 2021 What changed: These instructions start on Feb 22, 2021. If you are unsure what to do until then, ask your doctor or other care provider.   B-12 1000 MCG Caps Take 1,000 mcg by mouth daily.   cetirizine 10 MG tablet Commonly known as: ZYRTEC Take 10 mg by mouth daily.   Fish Oil 1000 MG Caps Take 2 capsules by mouth daily.   gabapentin 300 MG capsule Commonly known as: NEURONTIN Take 1 capsule (300 mg total) by mouth 2 (two) times daily.   Golytely 236 g solution Generic drug: polyethylene glycol Take 4,000 mLs by mouth as directed.   irbesartan-hydrochlorothiazide 300-12.5 MG tablet Commonly known as: AVALIDE TAKE 1 TABLET BY MOUTH DAILY   MULTIPLE VITAMIN PO Take 1 tablet by mouth daily.   omeprazole 40 MG capsule Commonly known as: PRILOSEC TAKE 1 CAPSULE BY  MOUTH EVERY DAY   rosuvastatin 5 MG tablet Commonly known as: CRESTOR TAKE 1 TABLET BY MOUTH once a week   tadalafil 10 MG tablet Commonly known as: CIALIS Take 5 mg by mouth daily as needed for erectile dysfunction.   tizanidine 2 MG capsule Commonly known as: ZANAFLEX TAKE 1 CAPSULE(2 MG) BY MOUTH TWICE DAILY AS NEEDED FOR MUSCLE SPASMS       Consultations:  Gastroenterology  Procedures/Studies:  4/13-colonoscopy showed ulcers from polypectomy site without active bleed and moderate left colon diverticulosis.     Epidural Steroid injection  Result Date: 01/25/2021 Magnus Sinning, MD     01/25/2021  4:28 PM Lumbosacral Transforaminal Epidural Steroid Injection - Sub-Pedicular Approach with Fluoroscopic Guidance Patient: Larry Williamson     Date of Birth: [birth year] MRN: [id number] PCP: Marin Olp, MD     Visit  Date: 01/25/2021  Universal Protocol:   Date/Time: 01/25/2021 Consent Given By: the patient Position: PRONE Additional Comments: Vital signs were monitored before and after the procedure. Patient was prepped and draped in the usual sterile fashion. The correct patient, procedure, and site was verified. Injection Procedure Details: Procedure diagnoses: Lumbar radiculopathy [M54.16]  Meds Administered: Meds ordered this encounter Medications . methylPREDNISolone acetate (DEPO-MEDROL) injection 40 mg Laterality: Right Location/Site: L4-L5 Needle:5.0 in., 22 ga.  Short bevel or Quincke spinal needle Needle Placement: Transforaminal Findings:   -Comments: Excellent flow of contrast along the nerve, nerve root and into the epidural space. Procedure Details: After squaring off the end-plates to get a true AP view, the C-arm was positioned so that an oblique view of the foramen as noted above was visualized. The target area is just inferior to the "nose of the scotty dog" or sub pedicular. The soft tissues overlying this structure were infiltrated with 2-3 ml. of 1% Lidocaine  without Epinephrine. The spinal needle was inserted toward the target using a "trajectory" view along the fluoroscope beam.  Under AP and lateral visualization, the needle was advanced so it did not puncture dura and was located close the 6 O'Clock position of the pedical in AP tracterory. Biplanar projections were used to confirm position. Aspiration was confirmed to be negative for CSF and/or blood. A 1-2 ml. volume of Isovue-250 was injected and flow of contrast was noted at each level. Radiographs were obtained for documentation purposes. After attaining the desired flow of contrast documented above, a 0.5 to 1.0 ml test dose of 0.25% Marcaine was injected into each respective transforaminal space.  The patient was observed for 90 seconds post injection.  After no sensory deficits were reported, and normal lower extremity motor function was noted,   the above injectate was administered so that equal amounts of the injectate were placed at each foramen (level) into the transforaminal epidural space. Additional Comments: The patient tolerated the procedure well Dressing: 2 x 2 sterile gauze and Band-Aid  Post-procedure details: Patient was observed during the procedure. Post-procedure instructions were reviewed. Patient left the clinic in stable condition.   CT Abdomen Pelvis W Contrast  Result Date: 01/30/2021 CLINICAL DATA:  Dark red blood after colonoscopy right lower abdominal pain EXAM: CT ABDOMEN AND PELVIS WITH CONTRAST TECHNIQUE: Multidetector CT imaging of the abdomen and pelvis was performed using the standard protocol following bolus administration of intravenous contrast. CONTRAST:  156mL OMNIPAQUE IOHEXOL 300 MG/ML  SOLN COMPARISON:  None. FINDINGS: Lower chest: Lung bases demonstrate no acute consolidation or effusion. Minimal foci of subpleural ground-glass density suggesting mild fibrosis. Cardiac size within normal limits. Hepatobiliary: Hepatic steatosis with probable sparing near the  gallbladder fossa. No calcified stones. No biliary dilatation Pancreas: Unremarkable. No pancreatic ductal dilatation or surrounding inflammatory changes. Spleen: Normal in size without focal abnormality. Adrenals/Urinary Tract: Adrenal glands are normal. Kidneys show no hydronephrosis. The bladder is thick walled but suboptimally distended. Stomach/Bowel: The stomach is nonenlarged. No dilated small bowel. Left colon diverticular disease without acute inflammatory thickening. Hyperdense focus within the ascending colon, coronal series 5, image 57, just distal to the ileocecal junction. This changes in shape and attenuation on delayed views and would be consistent with an area of active bleeding/extravasation. Vascular/Lymphatic: Mild aortic atherosclerosis. No aneurysm. No suspicious nodes Reproductive: Prostate is unremarkable. Other: Negative for free air or free fluid. Fat within the inguinal canals. Small fat containing umbilical hernia. Musculoskeletal: No acute or significant osseous findings. Degenerative changes most advanced  at L5-S1. IMPRESSION: 1. Hyperdense focus within the ascending colon just cranial to the ileocecal junction, consistent with an area of active bleeding/extravasation. 2. Left colon diverticular disease without acute inflammatory thickening. 3. Hepatic steatosis. 4. Thick-walled urinary bladder likely due to under distension, though could correlate with urinalysis to exclude cystitis. Critical Value/emergent results were called by telephone at the time of interpretation on 01/30/2021 at 8:35 pm to provider [facility] , who verbally acknowledged these results. Aortic Atherosclerosis (ICD10-I70.0). Electronically Signed   By: Donavan Foil M.D.   On: 01/30/2021 20:35   XR C-ARM NO REPORT  Result Date: 01/25/2021 Please see Notes tab for imaging impression.       The results of significant diagnostics from this hospitalization (including imaging, microbiology, ancillary and  laboratory) are listed below for reference.     Microbiology: Recent Results (from the past 240 hour(s))  SARS CORONAVIRUS 2 (TAT 6-24 HRS) Nasopharyngeal Nasopharyngeal Swab     Status: None   Collection Time: 01/30/21 11:04 PM   Specimen: Nasopharyngeal Swab  Result Value Ref Range Status   SARS Coronavirus 2 NEGATIVE NEGATIVE Final    Comment: (NOTE) SARS-CoV-2 target nucleic acids are NOT DETECTED.  The SARS-CoV-2 RNA is generally detectable in upper and lower respiratory specimens during the acute phase of infection. Negative results do not preclude SARS-CoV-2 infection, do not rule out co-infections with other pathogens, and should not be used as the sole basis for treatment or other patient management decisions. Negative results must be combined with clinical observations, patient history, and epidemiological information. The expected result is Negative.  Fact Sheet for Patients: SugarRoll.be  Fact Sheet for Healthcare Providers: https://www.woods-mathews.com/  This test is not yet approved or cleared by the [place] FDA and  has been authorized for detection and/or diagnosis of SARS-CoV-2 by FDA under an Emergency Use Authorization (EUA). This EUA will remain  in effect (meaning this test can be used) for the duration of the COVID-19 declaration under Se ction 564(b)(1) of the Act, 21 U.S.C. section 360bbb-3(b)(1), unless the authorization is terminated or revoked sooner.  Performed at [facility], [place] [address], [place],  [address]      Labs:  CBC: Recent Labs  Lab 01/30/21 2119 01/31/21 0050 01/31/21 0434 01/31/21 0921 01/31/21 1118 01/31/21 1734 02/01/21 0419  WBC 10.9* 10.7* 9.5  9.7  --  9.6 7.5  --   NEUTROABS 6.9  --   --   --   --   --   --   HGB 12.8* 12.2* 12.0*  12.0* 12.8* 12.9* 10.9* 10.6*  HCT 36.8* 36.2* 34.3*  34.8* 36.9* 37.2* 32.4* 31.0*  MCV 89.3 92.1 89.3  90.2  --   91.6 92.3  --   PLT 202 188 194  196  --  203 174  --    BMP &GFR Recent Labs  Lab 01/30/21 1833 01/31/21 0050  NA 138 135  K 3.9 4.3  CL 106 106  CO2 22 19*  GLUCOSE 101* 137*  BUN 18 19  CREATININE 0.94 1.14  CALCIUM 9.2 8.4*   Estimated Creatinine Clearance: 73 mL/min (by C-G formula based on SCr of 1.14 mg/dL). Liver & Pancreas: Recent Labs  Lab 01/30/21 1833 01/31/21 0050  AST 45* 36  ALT 56* 47*  ALKPHOS 34* 29*  BILITOT 1.3* 1.3*  PROT 7.3 6.4*  ALBUMIN 4.1 3.5   No results for input(s): LIPASE, AMYLASE in the last 168 hours. No results for input(s): AMMONIA  in the last 168 hours. Diabetic: No results for input(s): HGBA1C in the last 72 hours. No results for input(s): GLUCAP in the last 168 hours. Cardiac Enzymes: No results for input(s): CKTOTAL, CKMB, CKMBINDEX, TROPONINI in the last 168 hours. No results for input(s): PROBNP in the last 8760 hours. Coagulation Profile: No results for input(s): INR, PROTIME in the last 168 hours. Thyroid Function Tests: No results for input(s): TSH, T4TOTAL, FREET4, T3FREE, THYROIDAB in the last 72 hours. Lipid Profile: No results for input(s): CHOL, HDL, LDLCALC, TRIG, CHOLHDL, LDLDIRECT in the last 72 hours. Anemia Panel: Recent Labs    02/01/21 0419  VITAMINB12 311  FOLATE 22.2  FERRITIN 189  TIBC 297  IRON 75  RETICCTPCT 3.5*   Urine analysis:    Component Value Date/Time   COLORURINE YELLOW 10/26/2018 0926   APPEARANCEUR CLEAR 10/26/2018 0926   LABSPEC 1.015 10/26/2018 0926   PHURINE 6.0 10/26/2018 0926   GLUCOSEU NEGATIVE 10/26/2018 0926   HGBUR NEGATIVE 10/26/2018 0926   HGBUR negative 12/04/2009 0817   BILIRUBINUR NEGATIVE 10/26/2018 0926   BILIRUBINUR neg 09/26/2016 1103   KETONESUR NEGATIVE 10/26/2018 0926   PROTEINUR neg 09/26/2016 1103   UROBILINOGEN 0.2 10/26/2018 0926   NITRITE NEGATIVE 10/26/2018 0926   LEUKOCYTESUR NEGATIVE 10/26/2018 0926   Sepsis Labs: Invalid input(s):  PROCALCITONIN, LACTICIDVEN   Time coordinating discharge: 35 minutes  SIGNED:  Mercy Riding, MD  [facility] 02/01/2021, 5:20 PM  If 7PM-7AM, please contact night-coverage [email or web address]

## 2021-02-05 ENCOUNTER — Telehealth: Payer: Self-pay

## 2021-02-05 NOTE — Telephone Encounter (Incomplete)
Transition Care Management Follow-up Telephone Call  Date of discharge and from where: 02/01/21 from   How have you been since you were released from the hospital? ***  Any questions or concerns? {YES/NO TOC TRACKING USE FOR MANAGED MEDICAID REPORTING:[id number]}  Items Reviewed:  Did the pt receive and understand the discharge instructions provided? {YES/NO:21197}  Medications obtained and verified? {YES/NO:21197}  Other? {YES/NO:21197}  Any new allergies since your discharge? {YES/NO:21197}  Dietary orders reviewed? {YES/NO TITLE CASE:22902}  Do you have support at home? {YES/NO:21197}  Home Care and Equipment/Supplies: Were home health services ordered? {Response; yes/no/na:63} If so, what is the name of the agency? ***  Has the agency set up a time to come to the patient's home? {Response; yes/no/na:63} Were any new equipment or medical supplies ordered?  {OIB/BC:[id number]} What is the name of the medical supply agency? *** Were you able to get the supplies/equipment? {Response; yes/no/na:63} Do you have any questions related to the use of the equipment or supplies? {HWT/UU:[id number]}  Functional Questionnaire: (I = Independent and D = Dependent) ADLs: ***  Bathing/Dressing- ***  Meal Prep- ***  Eating- ***  Maintaining continence- ***  Transferring/Ambulation- ***  Managing Meds- ***  Follow up appointments reviewed:   PCP Hospital f/u appt confirmed? {YES/NO:21197} Scheduled to see *** on *** @ ***.  [facility] f/u appt confirmed? {YES/NO:21197} Scheduled to see *** on *** @ ***.  Are transportation arrangements needed? {YES/NO:21197}  If their condition worsens, is the pt aware to call PCP or go to the Emergency Dept.? {YES/NO TITLE CASE:22902}  Was the patient provided with contact information for the PCP's office or ED? {YES/NO TITLE CASE:22902}  Was to pt encouraged to call back with questions or concerns? {YES/NO TITLE CASE:22902}

## 2021-02-05 NOTE — Telephone Encounter (Signed)
Pt is scheduled for 4/28 at 3:40 pm.

## 2021-02-05 NOTE — Telephone Encounter (Signed)
Happy to see patient if needed- not currently scheduled

## 2021-02-05 NOTE — Telephone Encounter (Signed)
Can you please call and get him scheduled.

## 2021-02-05 NOTE — Telephone Encounter (Cosign Needed)
Transition Care Management Follow-up Telephone Call  Date of discharge and from where: [facility] 02/01/21  How have you been since you were released from the hospital? doing good  Any questions or concerns? No  Items Reviewed:  Did the pt receive and understand the discharge instructions provided? Yes   Medications obtained and verified? Yes   Other? No   Any new allergies since your discharge? No   Dietary orders reviewed? Yes  Do you have support at home? Yes   Home Care and Equipment/Supplies: Were home health services ordered? not applicable If so, what is the name of the agency?   Has the agency set up a time to come to the patient's home? not applicable Were any new equipment or medical supplies ordered?  No What is the name of the medical supply agency?  Were you able to get the supplies/equipment? not applicable Do you have any questions related to the use of the equipment or supplies? No  Functional Questionnaire: (I = Independent and D = Dependent) ADLs: I  Bathing/Dressing- I  Meal Prep- I  Eating- I  Maintaining continence- I  Transferring/Ambulation- I  Managing Meds- I  Follow up appointments reviewed:   PCP Hospital f/u appt confirmed? No  Scheduled to see lab and nurse visit for B12 injection on 02/08/21 per [facility] f/u appt confirmed? No  .  Are transportation arrangements needed? No   If their condition worsens, is the pt aware to call PCP or go to the Emergency Dept.? Yes  Was the patient provided with contact information for the PCP's office or ED? Yes  Was to pt encouraged to call back with questions or concerns? Yes

## 2021-02-08 ENCOUNTER — Ambulatory Visit (INDEPENDENT_AMBULATORY_CARE_PROVIDER_SITE_OTHER): Payer: Medicare Other

## 2021-02-08 ENCOUNTER — Other Ambulatory Visit (INDEPENDENT_AMBULATORY_CARE_PROVIDER_SITE_OTHER): Payer: Medicare Other

## 2021-02-08 ENCOUNTER — Other Ambulatory Visit: Payer: Self-pay

## 2021-02-08 DIAGNOSIS — K922 Gastrointestinal hemorrhage, unspecified: Secondary | ICD-10-CM

## 2021-02-08 DIAGNOSIS — E538 Deficiency of other specified B group vitamins: Secondary | ICD-10-CM

## 2021-02-08 LAB — CBC WITH DIFFERENTIAL/PLATELET
Basophils Absolute: 0.1 10*3/uL (ref 0.0–0.1)
Basophils Relative: 1 % (ref 0.0–3.0)
Eosinophils Absolute: 0.4 10*3/uL (ref 0.0–0.7)
Eosinophils Relative: 5.4 % — ABNORMAL HIGH (ref 0.0–5.0)
HCT: 33.9 % — ABNORMAL LOW (ref 39.0–52.0)
Hemoglobin: 11.7 g/dL — ABNORMAL LOW (ref 13.0–17.0)
Lymphocytes Relative: 31.1 % (ref 12.0–46.0)
Lymphs Abs: 2.2 10*3/uL (ref 0.7–4.0)
MCHC: 34.6 g/dL (ref 30.0–36.0)
MCV: 90.1 fl (ref 78.0–100.0)
Monocytes Absolute: 0.7 10*3/uL (ref 0.1–1.0)
Monocytes Relative: 9.4 % (ref 3.0–12.0)
Neutro Abs: 3.7 10*3/uL (ref 1.4–7.7)
Neutrophils Relative %: 53.1 % (ref 43.0–77.0)
Platelets: 229 10*3/uL (ref 150.0–400.0)
RBC: 3.76 Mil/uL — ABNORMAL LOW (ref 4.22–5.81)
RDW: 13.3 % (ref 11.5–15.5)
WBC: 7.1 10*3/uL (ref 4.0–10.5)

## 2021-02-08 MED ORDER — CYANOCOBALAMIN 1000 MCG/ML IJ SOLN
1000.0000 ug | Freq: Once | INTRAMUSCULAR | Status: AC
Start: 2021-02-08 — End: 2021-02-08
  Administered 2021-02-08: 1000 ug via INTRAMUSCULAR

## 2021-02-08 NOTE — Progress Notes (Signed)
Larry Williamson 70 yr old male presents to office today for monthly B12 injection per Garret Reddish, MD. Administered CYANOCOBALAMIN 1,000 mcg/ mL IM right arm. Patient tolerated well.

## 2021-02-13 ENCOUNTER — Encounter: Payer: Self-pay | Admitting: Gastroenterology

## 2021-02-14 NOTE — Progress Notes (Signed)
Phone [phone]   Subjective:  Larry Williamson is a 70 y.o. year old very pleasant male patient who presents for transitional care management and hospital follow up for GI Bleed Patient was hospitalized from 01/30/21 to 02/01/21. A TCM phone call was completed on 02/05/21. Medical complexity moderate   70 year old male with history of colon polyps, iron deficiency anemia, diverticulosis, GERD, hypertension and hyperlipidemia presented to the hospital with rectal bleeding after colonoscopy and polypectomy with removal of a polyps earlier that day (bowel movement with all blood) .  Initial hemoglobin was 14 on admission.  Patient underwent repeat colonoscopy on 413 that showed ulcers from polypectomy site without active bleed and moderate left colon diverticulosis-GI recommended observation overnight on full liquid diet.  Thankfully patient did not have any further GI bleed after hospitalization.  Hemoglobin dropped around 3 g but remained stable-some of this was dilutional.  He was cleared for discharge by gastroenterology for outpatient follow-up.  He was to avoid strenuous activity, aspirin or NSAIDs over the next 3 weeks.  He was advised to follow a full liquid diet for 2 to 3 days and then advance to soft diet. Plan for repeat colonoscopy in 6 months. PPI was discontinued initially started but as lower GI bleed was stopped - patient felt very weak with low blood counts but feels much better now.   liver function tests did jump up above baseline in hospital with bilirubin slightly high as well as slight increase in ast and alt- id like to check both of these agin.   For patient's hypertension he was continued on amlodipine and Avalide  For patient's hyperlipidemia-continue on home Crestor  B 12 injection monthly- in hospital was within normal range  Patient also continued on Neurontin and Zanaflex for chronic pain, Claritin for seasonal allergies  See problem oriented charting as  well  Past Medical History-  Patient Active Problem List   Diagnosis Date Noted  . Lower GI bleed 01/30/2021    Priority: High  . AAA (abdominal aortic aneurysm) ([place]) 01/18/2016    Priority: High  . Myalgia due to statin 07/06/2020    Priority: Medium  . B12 deficiency 12/03/2019    Priority: Medium  . Hyperglycemia 10/01/2016    Priority: Medium  . Meralgia paresthetica 12/28/2014    Priority: Medium  . Hyperlipidemia 05/15/2007    Priority: Medium  . Essential hypertension 05/15/2007    Priority: Medium  . Dupuytren's contracture of right hand 10/28/2019    Priority: Low  . History of skin cancer 12/28/2014    Priority: Low  . Former smoker 12/28/2014    Priority: Low  . Abnormal liver enzymes 12/29/2010    Priority: Low  . Allergic rhinitis 06/17/2008    Priority: Low  . ERECTILE DYSFUNCTION 12/07/2007    Priority: Low  . History of colonic polyps 12/07/2007    Priority: Low  . Umbilical hernia 73/71/0626    Priority: Low  . GERD 05/15/2007    Priority: Low    Medications- reviewed and updated  A medical reconciliation was performed comparing current medicines to hospital discharge medications. Current Outpatient Medications  Medication Sig Dispense Refill  . amLODipine (NORVASC) 2.5 MG tablet TAKE 1 TABLET(2.5 MG) BY MOUTH DAILY 90 tablet 3  . [START ON 02/22/2021] aspirin 81 MG tablet Take 1 tablet (81 mg total) by mouth daily. 30 tablet   . cetirizine (ZYRTEC) 10 MG tablet Take 10 mg by mouth daily.    . Cyanocobalamin 2000 MCG/ML SOLN  Inject 1,000 mcg as directed every 30 (thirty) days. Patient comes get his b12 injection every 3rd Thursday in the month.    . gabapentin (NEURONTIN) 300 MG capsule Take 1 capsule (300 mg total) by mouth 2 (two) times daily. 120 capsule 1  . irbesartan-hydrochlorothiazide (AVALIDE) 300-12.5 MG tablet TAKE 1 TABLET BY MOUTH DAILY 90 tablet 3  . MULTIPLE VITAMIN PO Take 1 tablet by mouth daily.    . Omega-3 Fatty Acids (FISH OIL)  1000 MG CAPS Take 2 capsules by mouth daily.    [name] omeprazole (PRILOSEC) 40 MG capsule TAKE 1 CAPSULE BY MOUTH EVERY DAY 90 capsule 1  . rosuvastatin (CRESTOR) 5 MG tablet TAKE 1 TABLET BY MOUTH once a week 13 tablet 3  . tadalafil (CIALIS) 10 MG tablet Take 5 mg by mouth daily as needed for erectile dysfunction.    . tizanidine (ZANAFLEX) 2 MG capsule TAKE 1 CAPSULE(2 MG) BY MOUTH TWICE DAILY AS NEEDED FOR MUSCLE SPASMS 40 capsule 2   No current facility-administered medications for this visit.   Objective  Objective:  BP 120/64   Pulse 77   Temp 98 F (36.7 C) (Temporal)   Ht 5\' 9"  (1.753 m)   Wt 233 lb 3.2 oz (105.8 kg)   SpO2 96%   BMI 34.44 kg/m  Gen: NAD, resting comfortably CV: RRR no murmurs rubs or gallops Lungs: CTAB no crackles, wheeze, rhonchi Abdomen: soft/nontender/nondistended/normal bowel sounds.  Ext: no edema Skin: warm, dry    Assessment and Plan:   Patient scheduled for TCM follow-up/hospital follow-up-we will bill has regular follow-up as patient is already set up for appropriate referrals and no ongoing needs unless hemoglobin worsens  #Lower GI bleed- hemoglobin already improving approximately a week ago-we will check again for stability at this time with CBC.  I discussed with patient not restarting aspirin but he has a very strong opinion that he should remain on this despite recent guideline changes- he knows as per GI to not restart until at least the fifth -Patient has been on omeprazole prior to hospitalization and 40 mg- he may continue this-consider reduction at follow-up if reflux well-controlled  #Hypertension-well-controlled on irbesartan hydrochlorothiazide 300-12.5 mg and amlodipine 2.5 mg-continue current medication  #Hyperlipidemia- continue rosuvastatin once a week- has had statin myalgias in the past and unable to tolerate higher doses most likely- recheck lipid panel at physical in 4 months   #History of abdominal aortic  aneurysm-suprarenal fusiform AAA 2.9 x 3.6 cm-improved and not technically an aneurysm territory July 2018 with 2-year repeat planned-not noted September 2020-potentially repeat 2-3 years out.  Would still like to target blood pressure at least less than 140   Recommended follow up: Return in about 4 months (around 06/17/2021) for physical or sooner if needed. Future Appointments  Date Time Provider [place]  03/08/2021  9:15 AM [facility] NURSE [facility]  03/21/2021  1:00 PM [facility] CCM PHARMACIST [facility]  04/05/2021  9:00 AM [facility] NURSE [facility]  05/03/2021  9:00 AM [facility] NURSE [facility]  06/07/2021  9:00 AM [facility] NURSE [facility]  07/05/2021  9:00 AM [facility] NURSE [facility]  07/11/2021  7:45 AM Lavonna Monarch, MD [facility]  08/02/2021  9:00 AM [facility] NURSE [facility]  08/30/2021  9:00 AM [facility] NURSE [facility]  09/27/2021  9:00 AM [facility] NURSE [facility]    Lab/Order associations:   ICD-10-CM   1. Essential hypertension  I10 CBC with Differential/Platelet    Comprehensive metabolic panel  2. Lower GI bleed  K92.2   3. Hyperlipidemia, unspecified hyperlipidemia type  E78.5   4. Abdominal aortic aneurysm (AAA) without rupture ([facility])  I71.4    Return precautions advised.  Garret Reddish, MD

## 2021-02-14 NOTE — Patient Instructions (Addendum)
Please stop by lab before you go If you have mychart- we will send your results within 3 business days of [place] receiving them.  If you do not have mychart- we will call you about results within 5 business days of [place] receiving them.  *please also note that you will see labs on mychart as soon as they post. I will later go in and write notes on them- will say "notes from Dr. Yong Channel"  Let us know immediately if any more blood. I lean toward holding off on restarting aspirin but I will respect your decision- at least wait until the 5th  Recommended follow up: Return in about 4 months (around 06/17/2021) for physical or sooner if needed.

## 2021-02-15 ENCOUNTER — Other Ambulatory Visit: Payer: Self-pay

## 2021-02-15 ENCOUNTER — Encounter: Payer: Self-pay | Admitting: Family Medicine

## 2021-02-15 ENCOUNTER — Ambulatory Visit (INDEPENDENT_AMBULATORY_CARE_PROVIDER_SITE_OTHER): Payer: Medicare Other | Admitting: Family Medicine

## 2021-02-15 VITALS — BP 120/64 | HR 77 | Temp 98.0°F | Ht 69.0 in | Wt 233.2 lb

## 2021-02-15 DIAGNOSIS — K922 Gastrointestinal hemorrhage, unspecified: Secondary | ICD-10-CM | POA: Diagnosis not present

## 2021-02-15 DIAGNOSIS — I714 Abdominal aortic aneurysm, without rupture, unspecified: Secondary | ICD-10-CM

## 2021-02-15 DIAGNOSIS — E785 Hyperlipidemia, unspecified: Secondary | ICD-10-CM

## 2021-02-15 DIAGNOSIS — I10 Essential (primary) hypertension: Secondary | ICD-10-CM | POA: Diagnosis not present

## 2021-02-16 LAB — COMPREHENSIVE METABOLIC PANEL
ALT: 41 U/L (ref 0–53)
AST: 34 U/L (ref 0–37)
Albumin: 4.2 g/dL (ref 3.5–5.2)
Alkaline Phosphatase: 33 U/L — ABNORMAL LOW (ref 39–117)
BUN: 18 mg/dL (ref 6–23)
CO2: 26 mEq/L (ref 19–32)
Calcium: 9.5 mg/dL (ref 8.4–10.5)
Chloride: 102 mEq/L (ref 96–112)
Creatinine, Ser: 1.13 mg/dL (ref 0.40–1.50)
GFR: 65.85 mL/min (ref >60.00)
Glucose, Bld: 90 mg/dL (ref 70–99)
Potassium: 4.2 mEq/L (ref 3.5–5.1)
Sodium: 137 mEq/L (ref 135–145)
Total Bilirubin: 0.6 mg/dL (ref 0.2–1.2)
Total Protein: 7 g/dL (ref 6.0–8.3)

## 2021-02-16 LAB — CBC WITH DIFFERENTIAL/PLATELET
Basophils Absolute: 0.1 10*3/uL (ref 0.0–0.1)
Basophils Relative: 1.3 % (ref 0.0–3.0)
Eosinophils Absolute: 0.4 10*3/uL (ref 0.0–0.7)
Eosinophils Relative: 5.5 % — ABNORMAL HIGH (ref 0.0–5.0)
HCT: 36 % — ABNORMAL LOW (ref 39.0–52.0)
Hemoglobin: 12.4 g/dL — ABNORMAL LOW (ref 13.0–17.0)
Lymphocytes Relative: 31.9 % (ref 12.0–46.0)
Lymphs Abs: 2.6 10*3/uL (ref 0.7–4.0)
MCHC: 34.4 g/dL (ref 30.0–36.0)
MCV: 91.4 fl (ref 78.0–100.0)
Monocytes Absolute: 0.7 10*3/uL (ref 0.1–1.0)
Monocytes Relative: 8.7 % (ref 3.0–12.0)
Neutro Abs: 4.2 10*3/uL (ref 1.4–7.7)
Neutrophils Relative %: 52.6 % (ref 43.0–77.0)
Platelets: 228 10*3/uL (ref 150.0–400.0)
RBC: 3.94 Mil/uL — ABNORMAL LOW (ref 4.22–5.81)
RDW: 13.6 % (ref 11.5–15.5)
WBC: 8.1 10*3/uL (ref 4.0–10.5)

## 2021-02-27 ENCOUNTER — Telehealth: Payer: Self-pay

## 2021-02-27 NOTE — Chronic Care Management (AMB) (Signed)
Chronic Care Management Pharmacy Assistant   Name: Zyion Doxtater  MRN: [id number] DOB: 1950/11/06  Reason for Encounter: Hypertension Disease State Call   Recent office visits:  02/15/21- Garret Reddish, MD- Hospitalization follow up, no medication changes, follow up 4 months   Recent consult visits:  01/23/21- Magnus Sinning, MD ([facility])-seen for low back pain, increased gabapentin from 100-200mg  daily to 300 mg twice daily, return for epidural steroid inj  Hospital visits:  Medication Reconciliation was completed by comparing discharge summary, patient's EMR and Pharmacy list, and upon discussion with patient.  Admitted to the hospital on 01/30/21 due to Lower GI bleed. Discharge date was 02/01/21 Discharged from [facility] sildenafil All other medications remain the same.    Medications: Outpatient Encounter Medications as of 02/27/2021  Medication Sig  . amLODipine (NORVASC) 2.5 MG tablet TAKE 1 TABLET(2.5 MG) BY MOUTH DAILY  . aspirin 81 MG tablet Take 1 tablet (81 mg total) by mouth daily.  . cetirizine (ZYRTEC) 10 MG tablet Take 10 mg by mouth daily.  . Cyanocobalamin 2000 MCG/ML SOLN Inject 1,000 mcg as directed every 30 (thirty) days. Patient comes get his b12 injection every 3rd Thursday in the month.  . gabapentin (NEURONTIN) 300 MG capsule Take 1 capsule (300 mg total) by mouth 2 (two) times daily.  . irbesartan-hydrochlorothiazide (AVALIDE) 300-12.5 MG tablet TAKE 1 TABLET BY MOUTH DAILY  . MULTIPLE VITAMIN PO Take 1 tablet by mouth daily.  . Omega-3 Fatty Acids (FISH OIL) 1000 MG CAPS Take 2 capsules by mouth daily.  [name] omeprazole (PRILOSEC) 40 MG capsule TAKE 1 CAPSULE BY MOUTH EVERY DAY  . rosuvastatin (CRESTOR) 5 MG tablet TAKE 1 TABLET BY MOUTH once a week  . tadalafil (CIALIS) 10 MG tablet Take 5 mg by mouth daily as needed for erectile dysfunction.  . tizanidine (ZANAFLEX) 2 MG capsule TAKE 1 CAPSULE(2 MG)  BY MOUTH TWICE DAILY AS NEEDED FOR MUSCLE SPASMS   No facility-administered encounter medications on file as of 02/27/2021.   Reviewed chart prior to disease state call. Spoke with patient regarding BP  Recent Office Vitals: BP Readings from Last 3 Encounters:  02/15/21 120/64  02/01/21 121/64  01/30/21 136/68   Pulse Readings from Last 3 Encounters:  02/15/21 77  02/01/21 67  01/30/21 63    Wt Readings from Last 3 Encounters:  02/15/21 233 lb 3.2 oz (105.8 kg)  01/30/21 238 lb (108 kg)  01/30/21 238 lb (108 kg)     Kidney Function Lab Results  Component Value Date/Time   CREATININE 1.13 02/15/2021 04:32 PM   CREATININE 1.14 01/31/2021 12:50 AM   CREATININE 1.02 07/06/2020 08:49 AM   GFR 65.85 02/15/2021 04:32 PM   GFRNONAA >60 01/31/2021 12:50 AM   GFRNONAA 75 07/06/2020 08:49 AM   GFRAA 87 07/06/2020 08:49 AM    BMP Latest Ref Rng & Units 02/15/2021 01/31/2021 01/30/2021  Glucose 70 - 99 mg/dL 90 137(H) 101(H)  BUN 6 - 23 mg/dL 18 19 18   Creatinine 0.40 - 1.50 mg/dL 1.13 1.14 0.94  BUN/Creat Ratio 6 - 22 (calc) - - -  Sodium 135 - 145 mEq/L 137 135 138  Potassium 3.5 - 5.1 mEq/L 4.2 4.3 3.9  Chloride 96 - 112 mEq/L 102 106 106  CO2 19 - 32 mEq/L 26 19(L) 22  Calcium 8.4 - 10.5 mg/dL 9.5 8.4(L) 9.2   After many attempts, I was able to get in contact with  Mr. Bracken. He was on his way out to the country and had bad service. He was unable to provide me with any readings for his BP, but he stated they have been good. He stated that there have been no changes to his medications or health. The conversation was short and provided me with a general sense that Mr. Laymon had no concerns.   Current antihypertensive regimen:  amLODopine  2.5 mg tablet Irbesartan-hydrochlorothiazide  300-12.5 mg tablet  Adherence Review: Is the patient currently on ACE/ARB medication? Yes Does the patient have >5 day gap between last estimated fill dates? No amLODopine  2.5 mg tablet- 90 DS  last filled 01/10/21  Star Rating Drugs: Irbesartan-hydrochlorothiazide  300-12.5 mg tablet- 90 DS last filled 01/05/21 Rosuvastatin 5 mg- 90 DS last filled 07/06/20 Patient stated he is currently taking this, but he couldn't confirm fill hx  Wilford Sports CPA, CMA

## 2021-03-08 ENCOUNTER — Ambulatory Visit: Payer: Medicare Other

## 2021-03-15 ENCOUNTER — Ambulatory Visit (INDEPENDENT_AMBULATORY_CARE_PROVIDER_SITE_OTHER): Payer: Medicare Other

## 2021-03-15 ENCOUNTER — Other Ambulatory Visit: Payer: Self-pay

## 2021-03-15 DIAGNOSIS — E538 Deficiency of other specified B group vitamins: Secondary | ICD-10-CM

## 2021-03-15 MED ORDER — CYANOCOBALAMIN 1000 MCG/ML IJ SOLN
1000.0000 ug | Freq: Once | INTRAMUSCULAR | Status: AC
  Administered 2021-03-15: 1000 ug via INTRAMUSCULAR

## 2021-03-15 NOTE — Progress Notes (Signed)
Patient presented today for b12 injection ordered by Dr. Yong Channel administered by Simeon Craft in [place]. Deltoid Patient tolerated well

## 2021-03-20 NOTE — Progress Notes (Signed)
    Chronic Care Management Pharmacy Assistant   Name: Larry Williamson  MRN: [id number] DOB: 04-09-1951  Reason for Encounter: Chart Review   Medications: Outpatient Encounter Medications as of 02/27/2021  Medication Sig  . amLODipine (NORVASC) 2.5 MG tablet TAKE 1 TABLET(2.5 MG) BY MOUTH DAILY  . aspirin 81 MG tablet Take 1 tablet (81 mg total) by mouth daily.  . cetirizine (ZYRTEC) 10 MG tablet Take 10 mg by mouth daily.  . Cyanocobalamin 2000 MCG/ML SOLN Inject 1,000 mcg as directed every 30 (thirty) days. Patient comes get his b12 injection every 3rd Thursday in the month.  . gabapentin (NEURONTIN) 300 MG capsule Take 1 capsule (300 mg total) by mouth 2 (two) times daily.  . irbesartan-hydrochlorothiazide (AVALIDE) 300-12.5 MG tablet TAKE 1 TABLET BY MOUTH DAILY  . MULTIPLE VITAMIN PO Take 1 tablet by mouth daily.  . Omega-3 Fatty Acids (FISH OIL) 1000 MG CAPS Take 2 capsules by mouth daily.  [name] omeprazole (PRILOSEC) 40 MG capsule TAKE 1 CAPSULE BY MOUTH EVERY DAY  . rosuvastatin (CRESTOR) 5 MG tablet TAKE 1 TABLET BY MOUTH once a week  . tadalafil (CIALIS) 10 MG tablet Take 5 mg by mouth daily as needed for erectile dysfunction.  . tizanidine (ZANAFLEX) 2 MG capsule TAKE 1 CAPSULE(2 MG) BY MOUTH TWICE DAILY AS NEEDED FOR MUSCLE SPASMS   No facility-administered encounter medications on file as of 02/27/2021.   Reviewed chart for medication changes and adherence.  No OVs, Consults, or hospital visits since last care coordination call / Pharmacist visit. No medication changes indicated  No gaps in adherence identified. Patient has follow up scheduled with pharmacy team. No further action required.  Wilford Sports CPA,CMA

## 2021-03-21 ENCOUNTER — Ambulatory Visit (INDEPENDENT_AMBULATORY_CARE_PROVIDER_SITE_OTHER): Payer: Medicare Other

## 2021-03-21 DIAGNOSIS — E785 Hyperlipidemia, unspecified: Secondary | ICD-10-CM

## 2021-03-21 DIAGNOSIS — K219 Gastro-esophageal reflux disease without esophagitis: Secondary | ICD-10-CM

## 2021-03-21 DIAGNOSIS — I10 Essential (primary) hypertension: Secondary | ICD-10-CM

## 2021-03-21 NOTE — Patient Instructions (Addendum)
Mr. Deady,  Thank you for talking with me today. I have included our care plan/goals in the following pages.   Please review and call me at [phone] with any questions.  Thanks! Ellin Mayhew, Pharm.D., BCGP Clinical Pharmacist [facility] [place] [phone]  Patient Care Plan: [facility] Plan    Problem Identified: Disease Management   Priority: High    Long-Range Goal: Patient-Specific Goal   Start Date: 03/21/2021  Expected End Date: 03/21/2022  This Visit's Progress: On track  Priority: High  Note:   Current Barriers:  . Unable to maintain control of HLD  Pharmacist Clinical Goal(s):  [name] Patient will contact provider office for questions/concerns as evidenced notation of same in electronic health record through collaboration with PharmD and provider.   Interventions: . 1:1 collaboration with Marin Olp, MD regarding development and update of comprehensive plan of care as evidenced by provider attestation and co-signature . Inter-disciplinary care team collaboration (see longitudinal plan of care) . Comprehensive medication review performed; medication list updated in electronic medical record . No Rx Changes  Hypertension (BP goal <140/90) -Controlled -Current treatment: . Amlodipine 2.5 mg once daily . Irbesartan-HCTZ 300-12.5 mg once daily   -Current home readings: did not provide today -Current dietary habits: 'no changes'  -Current exercise habits: no formal exercise, works every day  -Denies hypotensive/hypertensive symptoms -Counseled to monitor BP at home at least 1-2x per month, document, and provide log at future appointments -Counseled on diet and exercise extensively Recommended to continue current medication  Hyperlipidemia: (LDL goal < 70) -Uncontrolled.  -Current treatment: . Crestor 5 mg once weekly  -Medications previously tried: myalgia, several statin trials - atorvastatin,  pravastatin  -Educated on Benefits of statin for ASCVD risk reduction; Reports to tolerate rosuvastatin 5 mg once weekly - reviewed cholesterol goals at length with patient, could consider zetia (no hx on file)  -Did not review alcohol intake today  -Counseled on diet and exercise extensively Recommended to continue current medication   GERD (Goal: minimize symptoms) -Controlled  -No progress noted in terms of weight loss, reflux controlled if taking daily. -Previous GI bleed following polyp removal, gastric ulcer noted in 1992 -Current treatment  . Omeprazole 40 mg once daily  -Medications previously tried: failed omeprazole reduction to 20 mg - previous goal to get weight below 200 before considering   -Recommended to continue current medication  Patient Goals/Self-Care Activities . Patient will:  - take medications as prescribed  Follow Up Plan: [facility] f/u call 6 months to further review pt engagement in lifestyle modications/cholesterol med changes   Medication Assistance: None required.  Patient affirms current coverage meets needs.    The patient verbalized understanding of instructions provided today and declined copy of patient instruction and/or educational materials. Telephone follow up appointment with pharmacy team member scheduled for: See next appointment with "Care Management Staff" under "What's Next" below.

## 2021-03-21 NOTE — Progress Notes (Signed)
Chronic Care Management Pharmacy Note  03/21/2021 Name:  Larry Williamson MRN:  [id number] DOB:  06/01/1951  Summary: Recommendations/Changes made from today's visit: No Rx changes  Follow Up Plan: [facility] f/u call 6 months to further review pt engagement in lifestyle modications/potential cholesterol med changes  ________ Subjective: Larry Williamson is an 70 y.o. year old male who is a primary patient of Hunter, Aldine Contes, MD.  The CCM team was consulted for assistance with disease management and care coordination needs.    Engaged with patient by telephone for follow up visit in response to provider referral for pharmacy case management and/or care coordination services.   Consent to Services:  The patient was given information about Chronic Care Management services, agreed to services, and gave verbal consent prior to initiation of services.  Please see initial visit note for detailed documentation.   Patient Care Team: Shelva Majestic, MD as PCP - General (Family Medicine) Dahlia Byes, [facility] as Pharmacist (Pharmacist) Janalyn Harder, MD as Consulting Physician (Dermatology)  Objective:  Lab Results  Component Value Date   CREATININE 1.13 02/15/2021   CREATININE 1.14 01/31/2021   CREATININE 0.94 01/30/2021    Lab Results  Component Value Date   HGBA1C 5.1 10/26/2018   Last diabetic Eye exam: No results found for: HMDIABEYEEXA  Last diabetic Foot exam: No results found for: HMDIABFOOTEX      Component Value Date/Time   CHOL 189 10/28/2019 0838   TRIG 159.0 (H) 10/28/2019 0838   HDL 43.10 10/28/2019 0838   CHOLHDL 4 10/28/2019 0838   VLDL 31.8 10/28/2019 0838   LDLCALC 114 (H) 10/28/2019 0838   LDLDIRECT 116 (H) 07/06/2020 0849    Hepatic Function Latest Ref Rng & Units 02/15/2021 01/31/2021 01/30/2021  Total Protein 6.0 - 8.3 g/dL 7.0 8.6(A) 7.3  Albumin 3.5 - 5.2 g/dL 4.2 3.5 4.1  AST 0 - 37 U/L 34 36 45(H)  ALT 0 - 53 U/L 41 47(H) 56(H)  Alk  Phosphatase 39 - 117 U/L 33(L) 29(L) 34(L)  Total Bilirubin 0.2 - 1.2 mg/dL 0.6 8.4(G) 1.3(H)  Bilirubin, Direct 0.0 - 0.3 mg/dL - - -    Lab Results  Component Value Date/Time   TSH 2.56 07/06/2015 08:01 AM   TSH 2.49 07/04/2014 07:58 AM    CBC Latest Ref Rng & Units 02/15/2021 02/08/2021 02/01/2021  WBC 4.0 - 10.5 K/uL 8.1 7.1 -  Hemoglobin 13.0 - 17.0 g/dL 12.4(L) 11.7(L) 10.6(L)  Hematocrit 39.0 - 52.0 % 36.0(L) 33.9(L) 31.0(L)  Platelets 150.0 - 400.0 K/uL 228.0 229.0 -    No results found for: VD25OH  Clinical ASCVD:  The 10-year ASCVD risk score Denman George DC Jr., et al., 2013) is: 23.5%   Values used to calculate the score:     Age: 31 years     Sex: Male     Is Non-Hispanic African American: No     Diabetic: No     Tobacco smoker: Yes     Systolic Blood Pressure: 120 mmHg     Is BP treated: Yes     HDL Cholesterol: 43.1 mg/dL     Total Cholesterol: 189 mg/dL    Other: ([id number] if Afib, PHQ9 if depression, MMRC or CAT for COPD, ACT, DEXA)  Social History   Tobacco Use  Smoking Status Former Smoker  . Packs/day: 1.00  . Years: 15.00  . Pack years: 15.00  . Types: Cigarettes  . Quit date: 10/21/1978  . Years since quitting: 103.4  Smokeless Tobacco Never Used   BP Readings from Last 3 Encounters:  02/15/21 120/64  02/01/21 121/64  01/30/21 136/68   Pulse Readings from Last 3 Encounters:  02/15/21 77  02/01/21 67  01/30/21 63   Wt Readings from Last 3 Encounters:  02/15/21 233 lb 3.2 oz (105.8 kg)  01/30/21 238 lb (108 kg)  01/30/21 238 lb (108 kg)    Assessment: Review of patient past medical history, allergies, medications, health status, including review of consultants reports, laboratory and other test data, was performed as part of comprehensive evaluation and provision of chronic care management services.   SDOH:  (Social Determinants of Health) assessments and interventions performed: Yes.  CCM Care Plan  Allergies  Allergen Reactions  .  Atorvastatin     Other reaction(s): Cramp    Medications Reviewed Today    Reviewed by Madelin Rear, [facility] (Pharmacist) on 03/21/21 at 1525  Med List Status: <None>  Medication Order Taking? Sig Documenting Provider Last Dose Status Informant  amLODipine (NORVASC) 2.5 MG tablet [id number] No TAKE 1 TABLET(2.5 MG) BY MOUTH DAILY Marin Olp, MD Taking Active Self  aspirin 81 MG tablet [id number] No Take 1 tablet (81 mg total) by mouth daily. Mercy Riding, MD Taking Active   cetirizine (ZYRTEC) 10 MG tablet [id number] No Take 10 mg by mouth daily. [provider] Taking Active Self  Cyanocobalamin 2000 MCG/ML SOLN [id number] No Inject 1,000 mcg as directed every 30 (thirty) days. Patient comes get his b12 injection every 3rd Thursday in the month. [provider] Taking Active   gabapentin (NEURONTIN) 300 MG capsule [id number] No Take 1 capsule (300 mg total) by mouth 2 (two) times daily. Magnus Sinning, MD Taking Active Self  irbesartan-hydrochlorothiazide (AVALIDE) 300-12.5 MG tablet [id number] No TAKE 1 TABLET BY MOUTH DAILY Marin Olp, MD Taking Active Self  MULTIPLE VITAMIN PO [phone] No Take 1 tablet by mouth daily. [provider] Taking Active Self  Omega-3 Fatty Acids (FISH OIL) 1000 MG CAPS [id number] No Take 2 capsules by mouth daily. [provider] Taking Active Self  omeprazole (PRILOSEC) 40 MG capsule [id number] No TAKE 1 CAPSULE BY MOUTH EVERY DAY Marin Olp, MD Taking Active Self  rosuvastatin (CRESTOR) 5 MG tablet [id number] No TAKE 1 TABLET BY MOUTH once a week Marin Olp, MD Taking Active Self  tadalafil (CIALIS) 10 MG tablet [id number] No Take 5 mg by mouth daily as needed for erectile dysfunction. [provider] Taking Active Self  tizanidine (ZANAFLEX) 2 MG capsule [id number] No TAKE 1 CAPSULE(2 MG) BY MOUTH TWICE DAILY AS NEEDED FOR MUSCLE SPASMS Marin Olp, MD Taking Active Self         Patient Active  Problem List   Diagnosis Date Noted  . Lower GI bleed 01/30/2021  . Myalgia due to statin 07/06/2020  . B12 deficiency 12/03/2019  . Dupuytren's contracture of right hand 10/28/2019  . Hyperglycemia 10/01/2016  . AAA (abdominal aortic aneurysm) ([place]) 01/18/2016  . Meralgia paresthetica 12/28/2014  . History of skin cancer 12/28/2014  . Former smoker 12/28/2014  . Abnormal liver enzymes 12/29/2010  . Allergic rhinitis 06/17/2008  . ERECTILE DYSFUNCTION 12/07/2007  . History of colonic polyps 12/07/2007  . Umbilical hernia 24/58/0998  . Hyperlipidemia 05/15/2007  . Essential hypertension 05/15/2007  . GERD 05/15/2007    Immunization History  Administered Date(s) Administered  . Fluad Quad(high Dose 65+) 06/22/2019, 07/06/2020  . Influenza Split 07/30/2011, 07/10/2012  . Influenza Whole 08/04/2007, 07/21/2008,  07/18/2009, 07/16/2010  . Influenza, High Dose Seasonal PF 07/16/2016, 07/17/2017, 07/09/2018  . Influenza,inj,Quad PF,6+ Mos 07/05/2013, 08/03/2014, 07/13/2015  . Influenza-Unspecified 06/22/2019, 07/06/2020  . PFIZER(Purple Top)SARS-COV-2 Vaccination 11/21/2019, 12/12/2019, 10/03/2020  . Pneumococcal Conjugate-13 01/09/2016  . Pneumococcal Polysaccharide-23 04/01/2017  . Td 10/22/1999, 12/11/2009   Conditions to be addressed/monitored: AAA Hx, HLD, HTN, GI bleed Hx, B12 deficiency, GERD, ED    Care Plan : CCM Pharmacy Care Plan  Updates made by Dahlia Byes, [facility] since 03/21/2021 12:00 AM    Problem: Disease Management   Priority: High    Long-Range Goal: Patient-Specific Goal   Start Date: 03/21/2021  Expected End Date: 03/21/2022  This Visit's Progress: On track  Priority: High  Note:   Current Barriers:  . Unable to maintain control of HLD  Pharmacist Clinical Goal(s):  [name] Patient will contact provider office for questions/concerns as evidenced notation of same in electronic health record through collaboration with PharmD and provider.   Interventions: . 1:1  collaboration with Shelva Majestic, MD regarding development and update of comprehensive plan of care as evidenced by provider attestation and co-signature . Inter-disciplinary care team collaboration (see longitudinal plan of care) . Comprehensive medication review performed; medication list updated in electronic medical record . No Rx Changes  Hypertension (BP goal <140/90) -Controlled -Current treatment: . Amlodipine 2.5 mg once daily . Irbesartan-HCTZ 300-12.5 mg once daily   -Current home readings: did not provide today -Current dietary habits: 'no changes'  -Current exercise habits: no formal exercise, works every day  -Denies hypotensive/hypertensive symptoms -Counseled to monitor BP at home at least 1-2x per month, document, and provide log at future appointments -Counseled on diet and exercise extensively Recommended to continue current medication  Hyperlipidemia: (LDL goal < 70) -Uncontrolled.  -Current treatment: . Crestor 5 mg once weekly  -Medications previously tried: myalgia, several statin trials - atorvastatin, pravastatin  -Educated on Benefits of statin for ASCVD risk reduction; Reports to tolerate rosuvastatin 5 mg once weekly - reviewed cholesterol goals at length with patient, could consider zetia (no hx on file)  -Did not review alcohol intake today  -Counseled on diet and exercise extensively Recommended to continue current medication   GERD (Goal: minimize symptoms) -Controlled  -No progress noted in terms of weight loss, reflux controlled if taking daily. -Previous GI bleed following polyp removal, gastric ulcer noted in 1992 -Current treatment  . Omeprazole 40 mg once daily  -Medications previously tried: failed omeprazole reduction to 20 mg - previous goal to get weight below 200 before considering   -Recommended to continue current medication  Patient Goals/Self-Care Activities . Patient will:  - take medications as prescribed  Follow Up Plan:  [facility] f/u call 6 months to further review pt engagement in lifestyle modications/potential cholesterol med changes   Medication Assistance: None required.  Patient affirms current coverage meets needs.   Patient's preferred pharmacy is:  [id number] [place], [place] - [address] AT [facility] OF [address] & [facility] [address] [place] [address] Phone: [phone] Fax: [phone]  [id number] - [address], [place] - [address] [place] [address] Phone: [phone] Fax: [phone]  [id number] [place], [place] - [address] AT [facility] OF [address] & [facility] [address] [place] [address] Phone: [phone] Fax: [phone]  Follow Up:  Patient agrees to Care Plan and Follow-up.  Future Appointments  Date Time Provider Department Center  04/05/2021  9:00 AM [facility] NURSE [facility]  05/03/2021  9:00 AM [facility] NURSE [facility]  06/07/2021  9:00 AM [facility] NURSE [facility]  07/05/2021  9:00 AM [facility] NURSE [facility]  07/11/2021  7:45 AM Lavonna Monarch, MD [facility]  08/02/2021  9:00 AM [facility] NURSE [facility]  08/30/2021  9:00 AM [facility] NURSE [facility]  09/25/2021  3:30 PM [facility] CCM PHARMACIST [facility]  09/27/2021  9:00 AM [facility] NURSE [facility]   Madelin Rear, PharmD, CPP Clinical Pharmacist Practitioner  [facility]  [phone]

## 2021-04-05 ENCOUNTER — Encounter: Payer: Self-pay | Admitting: Family Medicine

## 2021-04-05 ENCOUNTER — Ambulatory Visit (INDEPENDENT_AMBULATORY_CARE_PROVIDER_SITE_OTHER): Payer: Medicare Other | Admitting: *Deleted

## 2021-04-05 ENCOUNTER — Other Ambulatory Visit: Payer: Self-pay

## 2021-04-05 DIAGNOSIS — E538 Deficiency of other specified B group vitamins: Secondary | ICD-10-CM

## 2021-04-05 MED ORDER — CYANOCOBALAMIN 1000 MCG/ML IJ SOLN
1000.0000 ug | Freq: Once | INTRAMUSCULAR | Status: AC
  Administered 2021-04-05: 1000 ug via INTRAMUSCULAR

## 2021-04-05 NOTE — Progress Notes (Signed)
Per orders of Dr. Jonni Sanger , injection of Cyanocobalamin 1000 mcg/ml given IM by Anselmo Pickler, LPN in left deltoid. Patient tolerated injection well. Patient will make appointment for 1 month.

## 2021-04-12 ENCOUNTER — Encounter: Payer: Self-pay | Admitting: Family Medicine

## 2021-05-03 ENCOUNTER — Ambulatory Visit (INDEPENDENT_AMBULATORY_CARE_PROVIDER_SITE_OTHER): Payer: Medicare Other

## 2021-05-03 ENCOUNTER — Other Ambulatory Visit: Payer: Self-pay

## 2021-05-03 DIAGNOSIS — E538 Deficiency of other specified B group vitamins: Secondary | ICD-10-CM

## 2021-05-03 MED ORDER — CYANOCOBALAMIN 1000 MCG/ML IJ SOLN
1000.0000 ug | Freq: Once | INTRAMUSCULAR | Status: AC
  Administered 2021-05-03: 1000 ug via INTRAMUSCULAR

## 2021-05-03 NOTE — Progress Notes (Signed)
Per orders of Dr. Yong Channel, injection of B-12 given by Tobe Sos in right deltoid. Patient tolerated injection well. Patient will make appointment for 1 month.

## 2021-05-04 ENCOUNTER — Telehealth: Payer: Self-pay | Admitting: Pharmacist

## 2021-05-04 NOTE — Chronic Care Management (AMB) (Signed)
Chronic Care Management Pharmacy Assistant   Name: Dionte Blaustein  MRN: [id number] DOB: [birth year]  Reason for Encounter:Hypertension Disease State call   Recent office visits:  None  Recent consult visits:  None  Hospital visits:  None in previous 6 months  Medications: Outpatient Encounter Medications as of 05/04/2021  Medication Sig   amLODipine (NORVASC) 2.5 MG tablet TAKE 1 TABLET(2.5 MG) BY MOUTH DAILY   aspirin 81 MG tablet Take 1 tablet (81 mg total) by mouth daily.   cetirizine (ZYRTEC) 10 MG tablet Take 10 mg by mouth daily.   Cyanocobalamin 2000 MCG/ML SOLN Inject 1,000 mcg as directed every 30 (thirty) days. Patient comes get his b12 injection every 3rd Thursday in the month.   gabapentin (NEURONTIN) 300 MG capsule Take 1 capsule (300 mg total) by mouth 2 (two) times daily.   irbesartan-hydrochlorothiazide (AVALIDE) 300-12.5 MG tablet TAKE 1 TABLET BY MOUTH DAILY   MULTIPLE VITAMIN PO Take 1 tablet by mouth daily.   Omega-3 Fatty Acids (FISH OIL) 1000 MG CAPS Take 2 capsules by mouth daily.   omeprazole (PRILOSEC) 40 MG capsule TAKE 1 CAPSULE BY MOUTH EVERY DAY   rosuvastatin (CRESTOR) 5 MG tablet TAKE 1 TABLET BY MOUTH once a week   tadalafil (CIALIS) 10 MG tablet Take 5 mg by mouth daily as needed for erectile dysfunction.   tizanidine (ZANAFLEX) 2 MG capsule TAKE 1 CAPSULE(2 MG) BY MOUTH TWICE DAILY AS NEEDED FOR MUSCLE SPASMS   No facility-administered encounter medications on file as of 05/04/2021.   Reviewed chart prior to disease state call. Spoke with patient regarding BP  Recent Office Vitals: BP Readings from Last 3 Encounters:  02/15/21 120/64  02/01/21 121/64  01/30/21 136/68   Pulse Readings from Last 3 Encounters:  02/15/21 77  02/01/21 67  01/30/21 63    Wt Readings from Last 3 Encounters:  02/15/21 233 lb 3.2 oz (105.8 kg)  01/30/21 238 lb (108 kg)  01/30/21 238 lb (108 kg)     Kidney Function Lab Results  Component Value  Date/Time   CREATININE 1.13 02/15/2021 04:32 PM   CREATININE 1.14 01/31/2021 12:50 AM   CREATININE 1.02 07/06/2020 08:49 AM   GFR 65.85 02/15/2021 04:32 PM   GFRNONAA >60 01/31/2021 12:50 AM   GFRNONAA 75 07/06/2020 08:49 AM   GFRAA 87 07/06/2020 08:49 AM    BMP Latest Ref Rng & Units 02/15/2021 01/31/2021 01/30/2021  Glucose 70 - 99 mg/dL 90 137(H) 101(H)  BUN 6 - 23 mg/dL 18 19 18   Creatinine 0.40 - 1.50 mg/dL 1.13 1.14 0.94  BUN/Creat Ratio 6 - 22 (calc) - - -  Sodium 135 - 145 mEq/L 137 135 138  Potassium 3.5 - 5.1 mEq/L 4.2 4.3 3.9  Chloride 96 - 112 mEq/L 102 106 106  CO2 19 - 32 mEq/L 26 19(L) 22  Calcium 8.4 - 10.5 mg/dL 9.5 8.4(L) 9.2   Patient was very pleasant to talk to.  He stays busy and is feeling great.  I asked him about the last fill date of Rosuvastatin as it shows last filled in 2021, he stated he was "taken off of that one".     Current antihypertensive regimen:  Amlodipine 2.5 mg once daily Irbesartan-HCTZ 300-12.5 mg once daily How often are you checking your Blood Pressure? infrequently Current home BP readings: 130/70 What recent interventions/DTPs have been made by any provider to improve Blood Pressure control since last CPP Visit: None Any recent hospitalizations or ED visits  since last visit with CPP? No What diet changes have been made to improve Blood Pressure Control?  Patient stated his diet is stable.  What exercise is being done to improve your Blood Pressure Control?  Patient stated he stays "wide open".  Keeping busy with his usual activities.   Adherence Review: Is the patient currently on ACE/ARB medication? Yes Does the patient have >5 day gap between last estimated fill dates? Yes Amlodipine 2.5mg   Last filled 04/12/21  90 DS   Star Rating Drugs: Rosuvastatin 5mg  Last filled 07/06/2020  90 DS rbesartan-HCTZ 300-12.5 mg  Last filled 04/04/21  90 DS  [place]

## 2021-05-16 ENCOUNTER — Other Ambulatory Visit: Payer: Self-pay | Admitting: Physical Medicine and Rehabilitation

## 2021-05-17 NOTE — Telephone Encounter (Signed)
Please advise 

## 2021-06-02 ENCOUNTER — Other Ambulatory Visit: Payer: Self-pay | Admitting: Family Medicine

## 2021-06-04 ENCOUNTER — Telehealth: Payer: Self-pay | Admitting: Family Medicine

## 2021-06-04 NOTE — Telephone Encounter (Signed)
Copied from [place] [phone]. Topic: Medicare AWV >> Jun 04, 2021 11:30 AM Harris-Coley, Hannah Beat wrote: Reason for CRM: Left message for patient to schedule Annual Wellness Visit.  Please schedule with Nurse Health Advisor Charlott Rakes, RN at [facility].  Please call [phone] ask for [facility]

## 2021-06-07 ENCOUNTER — Ambulatory Visit (INDEPENDENT_AMBULATORY_CARE_PROVIDER_SITE_OTHER): Payer: Medicare Other

## 2021-06-07 ENCOUNTER — Other Ambulatory Visit: Payer: Self-pay

## 2021-06-07 ENCOUNTER — Ambulatory Visit: Payer: Medicare Other

## 2021-06-07 DIAGNOSIS — E538 Deficiency of other specified B group vitamins: Secondary | ICD-10-CM

## 2021-06-07 MED ORDER — CYANOCOBALAMIN 1000 MCG/ML IJ SOLN
1000.0000 ug | Freq: Once | INTRAMUSCULAR | Status: AC
  Administered 2021-06-07: 1000 ug via INTRAMUSCULAR

## 2021-06-07 NOTE — Progress Notes (Signed)
Per orders of Dr. Yong Channel, injection of B-12 given by Tobe Sos in right deltoid. Patient tolerated injection well. Patient will make appointment for 1 month.

## 2021-06-21 ENCOUNTER — Telehealth (INDEPENDENT_AMBULATORY_CARE_PROVIDER_SITE_OTHER): Payer: Medicare Other | Admitting: Family Medicine

## 2021-06-21 ENCOUNTER — Encounter: Payer: Self-pay | Admitting: Family Medicine

## 2021-06-21 VITALS — Temp 98.5°F

## 2021-06-21 DIAGNOSIS — R059 Cough, unspecified: Secondary | ICD-10-CM

## 2021-06-21 MED ORDER — BENZONATATE 200 MG PO CAPS: 200.0000 mg | ORAL_CAPSULE | Freq: Two times a day (BID) | ORAL | 0 refills | Status: DC | PRN

## 2021-06-21 NOTE — Patient Instructions (Addendum)
  HOME CARE TIPS:  -[place] testing information: Most pharmacies also offer testing and home test kits. If the Covid19 test is positive, please contact the [facility] health pharmacy or schedule a video visit follow-up if you wish to pursue treatment.  Treatments for Covid19 are best given early in the course of the illness within the first 5 days of symptoms.   -I sent the medication(s) we discussed to your pharmacy: Meds ordered this encounter  Medications   benzonatate (TESSALON) 200 MG capsule    Sig: Take 1 capsule (200 mg total) by mouth 2 (two) times daily as needed for cough.    Dispense:  20 capsule    Refill:  0     -use an allergy pill such a zyrtec once daily and flonase 2 sprays each nostril daily for 2 weeks  -can use nasal saline a few times per day if you have nasal congestion  -stay hydrated, drink plenty of fluids and eat small healthy meals - avoid dairy  -can take 1000 IU (55mg) Vit D3 and 100-500 mg of Vit C daily per instructions  -If the Covid test is positive, [facility] for more information on home care, transmission and treatment for COVID19  -follow up with your doctor in 2-3 days unless improving and feeling better  -stay home while sick, except to seek medical care. If you have COVID19, ideally it would be best to stay home for a full 10 days since the onset of symptoms PLUS one day of no fever and feeling better. Wear a good mask that fits snugly (such as N95 or KN95) if around others to reduce the risk of transmission.  It was nice to meet you today, and I really hope you are feeling better soon. I help [place] out with telemedicine visits on Tuesdays and Thursdays and am available for visits on those days. If you have any concerns or questions following this visit please schedule a follow up visit with your Primary Care doctor or seek care at a local urgent care clinic to avoid delays in care.    Seek in person care or schedule a  follow up video visit promptly if your symptoms worsen, new concerns arise or you are not improving with treatment. Call 911 and/or seek emergency care if your symptoms are severe or life threatening.

## 2021-06-21 NOTE — Progress Notes (Signed)
Virtual Visit via Telephone Note  I connected with Larry Williamson on 06/21/21 at 12:20 PM EDT by telephone and verified that I am speaking with the correct person using two identifiers.   I discussed the limitations, risks, security and privacy concerns of performing an evaluation and management service by telephone and the availability of in person appointments. I also discussed with the patient that there may be a patient responsible charge related to this service. The patient expressed understanding and agreed to proceed.  Location patient: home, [place] Location provider: work or home office Participants present for the call: patient, provider Patient did not have a visit with me in the prior 7 days to address this/these issue(s).   History of Present Illness:  Acute telemedicine visit for Cough: -Onset: 2 days ago -Symptoms include:dry cough, drainage in the throat, throat feels raw, feels tired -Denies: fever, CP, SOB, NVD, inability to eat/drink/get out of bed, known sick contacts -Pertinent past medical history:see below, has allergies  -Pertinent medication allergies:  Allergies  Allergen Reactions   Atorvastatin     Other reaction(s): Cramp  -COVID-19 vaccine status: vaccinated x2 and had a booster  Past Medical History:  Diagnosis Date   Adenomatous colon polyp 06/2006   ALLERGIC RHINITIS    Allergy    seasonal allergies   Anemia    hx of   ANEMIA DUE TO DIETARY IRON DEFICIENCY 05/28/2007   Due to giving regularly giving blood. Resolved with cutting in half.      COMPRESSION FRACTURE, THORACIC VERTEBRA 03/09/2008   2009, no chronic pain    Diverticulitis of colon    DIVERTICULOSIS, COLON 05/28/2007   Qualifier: Diagnosis of  By: Arnoldo Morale MD, Balinda Quails    GERD (gastroesophageal reflux disease)    on meds   Hyperlipidemia    on meds   Hypertension    on meds   Melanoma ([place]) 02/23/2014   MM in situe, early, low mid back, exc   Squamous cell carcinoma in situ  11/10/2018   Top of right shoulder, CX3, 5FU   Squamous cell carcinoma of skin    SCC IN SITU TOP OF RIGHT SHOULDER TX CX3 5FU   Ulcer 1992   gastric    Observations/Objective: Patient sounds cheerful and well on the phone. I do not appreciate any SOB. Speech and thought processing are grossly intact. Patient reported vitals: T 98.8, BP 151/78  Assessment and Plan:  Cough  -we discussed possible serious and likely etiologies, options for evaluation and workup, limitations of telemedicine visit vs in person visit, treatment, treatment risks and precautions. Pt prefers to treat via telemedicine empirically rather than in person at this moment.  Query viral upper respiratory illness, COVID-19 versus other.  Possible allergies, given his history but with acute onset suspect more likely a viral illness.  Discussed options for COVID testing and did advise he could pursue treatment through the [facility] health pharmacies or follow-up video visit if positive.  Did provide him with his most recent GFR in case he decides to pursue treatment through the pharmacy.  In the interim, opted for Tessalon for cough and other symptomatic care measures provided in patient instructions.  Also discussed an allergy regimen. Advised to seek prompt in person care if worsening, new symptoms arise, or if is not improving with treatment. Advised of options for inperson care in case PCP office not available. Did let the patient know that I only do telemedicine shifts for  [facility] on Tuesdays and Thursdays and  advised a follow up visit with PCP or at an [facility] if has further questions or concerns.   Follow Up Instructions:  I did not refer this patient for an OV with me in the next 24 hours for this/these issue(s).  I discussed the assessment and treatment plan with the patient. The patient was provided an opportunity to ask questions and all were answered. The patient agreed with the plan and demonstrated an understanding of the  instructions.   I spent 18 minutes on the date of this visit in the care of this patient. See summary of tasks completed to properly care for this patient in the detailed notes above which also included counseling of above, review of PMH, medications, allergies, evaluation of the patient and ordering and/or  instructing patient on testing and care options.     Lucretia Kern, DO

## 2021-06-26 ENCOUNTER — Telehealth: Payer: Self-pay | Admitting: Pharmacist

## 2021-06-26 NOTE — Progress Notes (Addendum)
Chronic Care Management Pharmacy Assistant   Name: Larry Williamson  MRN: [id number] DOB: [birth year]    Reason for Encounter: Hypertension Adherence Call    Recent office visits:  06/21/21 Colin Benton, DO [facility] Cold symptoms, Started Benzonatate '200mg'$  1 Cap by mouth 2 times daily as needed for cough. Follow up as needed.   Recent consult visits:  None  Hospital visits:  None   Medications: Outpatient Encounter Medications as of 06/26/2021  Medication Sig   amLODipine (NORVASC) 2.5 MG tablet TAKE 1 TABLET(2.5 MG) BY MOUTH DAILY   aspirin 81 MG tablet Take 1 tablet (81 mg total) by mouth daily.   benzonatate (TESSALON) 200 MG capsule Take 1 capsule (200 mg total) by mouth 2 (two) times daily as needed for cough.   cetirizine (ZYRTEC) 10 MG tablet Take 10 mg by mouth daily.   Cyanocobalamin 2000 MCG/ML SOLN Inject 1,000 mcg as directed every 30 (thirty) days. Patient comes get his b12 injection every 3rd Thursday in the month.   gabapentin (NEURONTIN) 300 MG capsule TAKE 1 CAPSULE(300 MG) BY MOUTH TWICE DAILY   irbesartan-hydrochlorothiazide (AVALIDE) 300-12.5 MG tablet TAKE 1 TABLET BY MOUTH DAILY   MULTIPLE VITAMIN PO Take 1 tablet by mouth daily.   Omega-3 Fatty Acids (FISH OIL) 1000 MG CAPS Take 2 capsules by mouth daily.   omeprazole (PRILOSEC) 40 MG capsule TAKE 1 CAPSULE BY MOUTH EVERY DAY   rosuvastatin (CRESTOR) 5 MG tablet TAKE 1 TABLET BY MOUTH once a week   tadalafil (CIALIS) 10 MG tablet Take 5 mg by mouth daily as needed for erectile dysfunction.   tizanidine (ZANAFLEX) 2 MG capsule TAKE 1 CAPSULE(2 MG) BY MOUTH TWICE DAILY AS NEEDED FOR MUSCLE SPASMS   No facility-administered encounter medications on file as of 06/26/2021.   Reviewed chart prior to disease state call. Spoke with patient regarding BP  Recent Office Vitals: BP Readings from Last 3 Encounters:  02/15/21 120/64  02/01/21 121/64  01/30/21 136/68   Pulse Readings from Last 3 Encounters:  02/15/21  77  02/01/21 67  01/30/21 63    Wt Readings from Last 3 Encounters:  02/15/21 233 lb 3.2 oz (105.8 kg)  01/30/21 238 lb (108 kg)  01/30/21 238 lb (108 kg)     Kidney Function Lab Results  Component Value Date/Time   CREATININE 1.13 02/15/2021 04:32 PM   CREATININE 1.14 01/31/2021 12:50 AM   CREATININE 1.02 07/06/2020 08:49 AM   GFR 65.85 02/15/2021 04:32 PM   GFRNONAA >60 01/31/2021 12:50 AM   GFRNONAA 75 07/06/2020 08:49 AM   GFRAA 87 07/06/2020 08:49 AM    BMP Latest Ref Rng & Units 02/15/2021 01/31/2021 01/30/2021  Glucose 70 - 99 mg/dL 90 137(H) 101(H)  BUN 6 - 23 mg/dL '18 19 18  '$ Creatinine 0.40 - 1.50 mg/dL 1.13 1.14 0.94  BUN/Creat Ratio 6 - 22 (calc) - - -  Sodium 135 - 145 mEq/L 137 135 138  Potassium 3.5 - 5.1 mEq/L 4.2 4.3 3.9  Chloride 96 - 112 mEq/L 102 106 106  CO2 19 - 32 mEq/L 26 19(L) 22  Calcium 8.4 - 10.5 mg/dL 9.5 8.4(L) 9.2    Current antihypertensive regimen:  Amlodipine 2.5 mg 1 tablet daily Irbesartan-HCTZ 300 mg-12.5 mg 1 tablet daily   How often are you checking your Blood Pressure? 3-5x per week  Current home BP readings: 130-145/78  What recent interventions/DTPs have been made by any provider to improve Blood Pressure control since last CPP  Visit: None  Any recent hospitalizations or ED visits since last visit with CPP? None  What diet changes have been made to improve Blood Pressure Control?  None  What exercise is being done to improve your Blood Pressure Control?  Pt states he works everyday that keeps him moving.   Adherence Review: Is the patient currently on ACE/ARB medication? Yes Does the patient have >5 day gap between last estimated fill dates? Yes  Care Gaps: Zoster Vaccines- Shingrix: Never Done  COVID-19 Vaccination: Postponed until 07/07/21 PPSV23 Vaccination: Completed  Influenza Vaccination: Overdue since 05/21/21 Colonoscopy: Next due 02/01/24 Tetanus/DTAP: Next due on 07/27/25 HPV Vaccines: Aged Out  I spoke with  the patient about his overdue care gaps. Patient states he has completed both shingles vaccines. He also states that when he goes back go Dr. Ronney Lion offices for his B 12 injection he also wants to get his flu shot.  Future Appointments  Date Time Provider [place]  07/02/2021  9:50 AM Ladene Artist, MD [facility]  07/05/2021  9:00 AM [facility] NURSE [facility]  07/11/2021  7:45 AM Lavonna Monarch, MD [facility]  08/02/2021  9:00 AM [facility] NURSE [facility]  08/30/2021  9:00 AM [facility] NURSE [facility]  09/25/2021  3:30 PM [facility] CCM PHARMACIST [facility]  09/27/2021  9:00 AM [facility] NURSE [facility]     Star Rating Drugs: Rosuvastatin 5 mg last filled 07/06/20 [facility] - patient states he takes this medication once a week and has not ran out of medication. Irbesartan-Hydrochlorothiazide 300-12.5 mg last filled 04/04/21 [place]  5 minutes spent in review, coordination, and documentation.  Reviewed by: Beverly Milch, PharmD Clinical Pharmacist [phone]

## 2021-06-27 ENCOUNTER — Other Ambulatory Visit: Payer: Self-pay | Admitting: Family Medicine

## 2021-06-28 ENCOUNTER — Telehealth: Payer: Self-pay

## 2021-06-28 NOTE — Telephone Encounter (Signed)
-----   Message from Ladene Artist, MD sent at 06/26/2021  6:03 PM EDT ----- OK for direct colonoscopy with an extended bowel prep. He had a post polypectomy bleed in 01/2021 so the patient might want to discuss colonoscopy and it's risks or discuss another concern during an office appt which is fine with me. OK to offer him a direct schedule however an office appt is fine if the patient prefers.   ----- Message ----- From: Lindon Romp, CMA Sent: 06/26/2021  12:00 AM EDT To: Ladene Artist, MD  This patient is scheduled to see you in the office on 07/02/21 to "discuss colon". Is there any reason this patient cannot be a direct colon for surveillance of piecemeal polypectomy? I see he is not on blood thinners. Thanks

## 2021-06-28 NOTE — Telephone Encounter (Signed)
Spoke with patient and offered direct colon and nurse visit. Patient states he would prefer to be direct. Scheduled patient for colon in [place] on 09/05/21 at 8 am and telephone nurse visit on 08/26/21 at 8 am. Informed patient that he will be an extended bowel prep. He verbalized understanding.

## 2021-07-02 ENCOUNTER — Ambulatory Visit: Payer: Medicare Other | Admitting: Gastroenterology

## 2021-07-05 ENCOUNTER — Encounter: Payer: Self-pay | Admitting: Family Medicine

## 2021-07-05 ENCOUNTER — Ambulatory Visit (INDEPENDENT_AMBULATORY_CARE_PROVIDER_SITE_OTHER): Payer: Medicare Other

## 2021-07-05 ENCOUNTER — Other Ambulatory Visit: Payer: Self-pay

## 2021-07-05 DIAGNOSIS — E538 Deficiency of other specified B group vitamins: Secondary | ICD-10-CM

## 2021-07-05 DIAGNOSIS — Z23 Encounter for immunization: Secondary | ICD-10-CM | POA: Diagnosis not present

## 2021-07-05 MED ORDER — CYANOCOBALAMIN 1000 MCG/ML IJ SOLN
1000.0000 ug | Freq: Once | INTRAMUSCULAR | Status: AC
Start: 2021-07-05 — End: 2021-07-05
  Administered 2021-07-05: 1000 ug via INTRAMUSCULAR

## 2021-07-05 NOTE — Progress Notes (Signed)
Per orders of Dr. Yong Channel, injection of B-12 given by Tobe Sos in left deltoid. Patient tolerated injection well. Patient will make appointment for 1 month.

## 2021-07-06 ENCOUNTER — Other Ambulatory Visit: Payer: Self-pay | Admitting: Family Medicine

## 2021-07-11 ENCOUNTER — Ambulatory Visit: Payer: Medicare Other | Admitting: Dermatology

## 2021-07-11 ENCOUNTER — Encounter: Payer: Self-pay | Admitting: Dermatology

## 2021-07-11 ENCOUNTER — Other Ambulatory Visit: Payer: Self-pay

## 2021-07-11 DIAGNOSIS — Z85828 Personal history of other malignant neoplasm of skin: Secondary | ICD-10-CM

## 2021-07-11 DIAGNOSIS — B078 Other viral warts: Secondary | ICD-10-CM | POA: Diagnosis not present

## 2021-07-11 DIAGNOSIS — Z1283 Encounter for screening for malignant neoplasm of skin: Secondary | ICD-10-CM | POA: Diagnosis not present

## 2021-07-11 DIAGNOSIS — Z85820 Personal history of malignant melanoma of skin: Secondary | ICD-10-CM | POA: Diagnosis not present

## 2021-07-11 DIAGNOSIS — L570 Actinic keratosis: Secondary | ICD-10-CM

## 2021-07-25 ENCOUNTER — Encounter: Payer: Self-pay | Admitting: Dermatology

## 2021-08-01 NOTE — Progress Notes (Signed)
   Follow-Up Visit   Subjective  Larry Williamson is a 70 y.o. male who presents for the following: Annual Exam (Left knee wart? Patient has history of scc and mm ).  General skin examination, lesion on left leg.  History of skin cancers Location:  Duration:  Quality:  Associated Signs/Symptoms: Modifying Factors:  Severity:  Timing: Context:   Objective  Well appearing patient in no apparent distress; mood and affect are within normal limits. No atypical pigmented lesions, no new or recurrent nonmelanoma skin cancer.  Left Hand - Posterior Hornlike 4 mm pink crust  Left Knee - Anterior Verrucous 5 mm pink papule    A full examination was performed including scalp, head, eyes, ears, nose, lips, neck, chest, axillae, abdomen, back, buttocks, bilateral upper extremities, bilateral lower extremities, hands, feet, fingers, toes, fingernails, and toenails. All findings within normal limits unless otherwise noted below.   Assessment & Plan    AK (actinic keratosis) Left Hand - Posterior  Destruction of lesion - Left Hand - Posterior Complexity: simple   Destruction method: cryotherapy   Informed consent: discussed and consent obtained   Timeout:  patient name, date of birth, surgical site, and procedure verified Lesion destroyed using liquid nitrogen: Yes   Cryotherapy cycles:  3 Outcome: patient tolerated procedure well with no complications   Post-procedure details: wound care instructions given    Other viral warts Left Knee - Anterior  I agree with patient's self-made diagnosis of wart.  He will leave this if it is stable.  If there is growth or bleeding, return for biopsy.  Encounter for screening for malignant neoplasm of skin  Annual skin examination.      I, Lavonna Monarch, MD, have reviewed all documentation for this visit.  The documentation on 08/01/21 for the exam, diagnosis, procedures, and orders are all accurate and complete.

## 2021-08-02 ENCOUNTER — Ambulatory Visit (INDEPENDENT_AMBULATORY_CARE_PROVIDER_SITE_OTHER): Payer: Medicare Other

## 2021-08-02 ENCOUNTER — Other Ambulatory Visit: Payer: Self-pay

## 2021-08-02 DIAGNOSIS — E538 Deficiency of other specified B group vitamins: Secondary | ICD-10-CM

## 2021-08-02 MED ORDER — CYANOCOBALAMIN 1000 MCG/ML IJ SOLN
1000.0000 ug | Freq: Once | INTRAMUSCULAR | Status: AC
Start: 2021-08-02 — End: 2021-08-02
  Administered 2021-08-02: 1000 ug via INTRAMUSCULAR

## 2021-08-02 NOTE — Progress Notes (Signed)
Larry Williamson 70 yr old male presents to office today for monthly B12 injection per Garret Reddish, MD. Administered CYANOBALAMIN 1,000 mcg IM right arm. Patient tolerated well.

## 2021-08-29 ENCOUNTER — Ambulatory Visit (AMBULATORY_SURGERY_CENTER): Payer: Medicare Other | Admitting: *Deleted

## 2021-08-29 ENCOUNTER — Encounter: Payer: Self-pay | Admitting: *Deleted

## 2021-08-29 ENCOUNTER — Other Ambulatory Visit: Payer: Self-pay

## 2021-08-29 ENCOUNTER — Encounter: Payer: Self-pay | Admitting: Gastroenterology

## 2021-08-29 VITALS — Ht 69.0 in | Wt 233.0 lb

## 2021-08-29 DIAGNOSIS — Z86010 Personal history of colonic polyps: Secondary | ICD-10-CM

## 2021-08-29 MED ORDER — PEG 3350-KCL-NA BICARB-NACL 420 G PO SOLR: 4000.0000 mL | Freq: Once | ORAL | 0 refills | Status: AC

## 2021-08-29 NOTE — Progress Notes (Signed)
Patient's pre-visit was done today over the phone with the patient. Name,DOB and address verified. Patient denies any allergies to Eggs and Soy. Patient denies any problems with anesthesia/sedation. Patient is not taking any diet pills or blood thinners. No home Oxygen. Prep instructions sent to pt's mychart -pt is aware. Pt did not want them mailed. Patient understands to call us back with any questions or concerns. Patient is aware of our care-partner policy and [id number] safety protocol. Patient denies any medical chart changes since last GI visit.  EMMI education assigned to the patient for the procedure, sent to [place].   The patient is COVID-19 vaccinated.

## 2021-08-30 ENCOUNTER — Encounter: Payer: Self-pay | Admitting: *Deleted

## 2021-08-30 ENCOUNTER — Ambulatory Visit (INDEPENDENT_AMBULATORY_CARE_PROVIDER_SITE_OTHER): Payer: Medicare Other | Admitting: *Deleted

## 2021-08-30 DIAGNOSIS — E538 Deficiency of other specified B group vitamins: Secondary | ICD-10-CM

## 2021-08-30 MED ORDER — CYANOCOBALAMIN 1000 MCG/ML IJ SOLN
1000.0000 ug | Freq: Once | INTRAMUSCULAR | Status: AC
  Administered 2021-08-30: 1000 ug via INTRAMUSCULAR

## 2021-08-30 NOTE — Progress Notes (Signed)
I have reviewed and agree with note, evaluation, plan.   Fajr Fife, MD  

## 2021-08-30 NOTE — Progress Notes (Signed)
Per orders of Dr. Yong Channel, injection of Cycanocobalamin 1000 mcg/ml given IM by Anselmo Pickler, LPN in left deltoid. Patient tolerated injection well. Patient will make appointment for 1 month.

## 2021-09-05 ENCOUNTER — Ambulatory Visit (AMBULATORY_SURGERY_CENTER): Payer: Medicare Other | Admitting: Gastroenterology

## 2021-09-05 ENCOUNTER — Other Ambulatory Visit: Payer: Self-pay

## 2021-09-05 ENCOUNTER — Encounter: Payer: Self-pay | Admitting: Gastroenterology

## 2021-09-05 VITALS — BP 139/67 | HR 69 | Temp 97.8°F | Resp 17 | Ht 69.0 in | Wt 233.0 lb

## 2021-09-05 DIAGNOSIS — Z86010 Personal history of colonic polyps: Secondary | ICD-10-CM

## 2021-09-05 DIAGNOSIS — D123 Benign neoplasm of transverse colon: Secondary | ICD-10-CM | POA: Diagnosis not present

## 2021-09-05 DIAGNOSIS — E785 Hyperlipidemia, unspecified: Secondary | ICD-10-CM | POA: Diagnosis not present

## 2021-09-05 DIAGNOSIS — D122 Benign neoplasm of ascending colon: Secondary | ICD-10-CM | POA: Diagnosis not present

## 2021-09-05 MED ORDER — SODIUM CHLORIDE 0.9 % IV SOLN: 500.0000 mL | Freq: Once | INTRAVENOUS | Status: DC

## 2021-09-05 NOTE — Progress Notes (Signed)
To PACU, VSS. Report to [email or web address] 

## 2021-09-05 NOTE — Progress Notes (Signed)
Called to room to assist during endoscopic procedure.  Patient ID and intended procedure confirmed with present staff. Received instructions for my participation in the procedure from the performing physician.  

## 2021-09-05 NOTE — Progress Notes (Signed)
VS completed by CW.   Pt's states no medical or surgical changes since previsit or office visit.  

## 2021-09-05 NOTE — Patient Instructions (Signed)
YOU HAD AN ENDOSCOPIC PROCEDURE TODAY AT [facility]:   Refer to the procedure report that was given to you for any specific questions about what was found during the examination.  If the procedure report does not answer your questions, please call your gastroenterologist to clarify.  If you requested that your care partner not be given the details of your procedure findings, then the procedure report has been included in a sealed envelope for you to review at your convenience later.  YOU SHOULD EXPECT: Some feelings of bloating in the abdomen. Passage of more gas than usual.  Walking can help get rid of the air that was put into your GI tract during the procedure and reduce the bloating. If you had a lower endoscopy (such as a colonoscopy or flexible sigmoidoscopy) you may notice spotting of blood in your stool or on the toilet paper. If you underwent a bowel prep for your procedure, you may not have a normal bowel movement for a few days.  Please Note:  You might notice some irritation and congestion in your nose or some drainage.  This is from the oxygen used during your procedure.  There is no need for concern and it should clear up in a day or so.  SYMPTOMS TO REPORT IMMEDIATELY:  Following lower endoscopy (colonoscopy or flexible sigmoidoscopy):  Excessive amounts of blood in the stool  Significant tenderness or worsening of abdominal pains  Swelling of the abdomen that is new, acute  Fever of 100F or higher   For urgent or emergent issues, a gastroenterologist can be reached at any hour by calling [phone]. Do not use MyChart messaging for urgent concerns.    DIET:  We do recommend a small meal at first, but then you may proceed to your regular diet.  Drink plenty of fluids but you should avoid alcoholic beverages for 24 hours.  ACTIVITY:  You should plan to take it easy for the rest of today and you should NOT DRIVE or use heavy machinery until tomorrow (because  of the sedation medicines used during the test).    FOLLOW UP: Our staff will call the number listed on your records 48-72 hours following your procedure to check on you and address any questions or concerns that you may have regarding the information given to you following your procedure. If we do not reach you, we will leave a message.  We will attempt to reach you two times.  During this call, we will ask if you have developed any symptoms of COVID 19. If you develop any symptoms (ie: fever, flu-like symptoms, shortness of breath, cough etc.) before then, please call [phone].  If you test positive for Covid 19 in the 2 weeks post procedure, please call and report this information to [place].    If any biopsies were taken you will be contacted by phone or by letter within the next 1-3 weeks.  Please call us at [phone] if you have not heard about the biopsies in 3 weeks.    SIGNATURES/CONFIDENTIALITY: You and/or your care partner have signed paperwork which will be entered into your electronic medical record.  These signatures attest to the fact that that the information above on your After Visit Summary has been reviewed and is understood.  Full responsibility of the confidentiality of this discharge information lies with you and/or your care-partner.   NO ASPIRIN,IBUPROFEN,NAPROXEN,OR OTHER NON-STEROIDAL ANTI-INFLAMMATORY DRUGS FOR 2 WEEKS,RESUME REMAINDER OF MEDICATION. INFORMATION GIVEN ON POLYPS,DIVERTICULOSIS AND  HEMORRHOIDS.

## 2021-09-05 NOTE — Progress Notes (Signed)
History & Physical  Primary Care Physician:  Marin Olp, MD Primary Gastroenterologist: Lucio Edward, MD  CHIEF COMPLAINT:  Personal history of colon polyps   HPI: Larry Williamson is a 70 y.o. male with a personal history of adenomatous colon polyps.  Piecemeal polypectomy of a 25 mm ascending colon adenomatous polyp 6 months ago for repeat colonoscopy.    Past Medical History:  Diagnosis Date   Adenomatous colon polyp 06/2006   ALLERGIC RHINITIS    Allergy    seasonal allergies   Anemia    hx of   ANEMIA DUE TO DIETARY IRON DEFICIENCY 05/28/2007   Due to giving regularly giving blood. Resolved with cutting in half.      COMPRESSION FRACTURE, THORACIC VERTEBRA 03/09/2008   2009, no chronic pain    Diverticulitis of colon    DIVERTICULOSIS, COLON 05/28/2007   Qualifier: Diagnosis of  By: Arnoldo Morale MD, Balinda Quails    GERD (gastroesophageal reflux disease)    on meds   Hyperlipidemia    on meds   Hypertension    on meds   Melanoma ([place]) 02/23/2014   MM in situe, early, low mid back, exc   Squamous cell carcinoma in situ 11/10/2018   Top of right shoulder, CX3, 5FU   Squamous cell carcinoma of skin    SCC IN SITU TOP OF RIGHT SHOULDER TX CX3 5FU   Ulcer 1992   gastric    Past Surgical History:  Procedure Laterality Date   COLONOSCOPY  01/2017   hx polyps/tics/hems   COLONOSCOPY  2019   [facility]-TICS/int hems/3 yr recall-   COLONOSCOPY W/ POLYPECTOMY  01/30/2021   Dr.Yue Glasheen   COLONOSCOPY WITH PROPOFOL N/A 01/31/2021   Procedure: COLONOSCOPY WITH PROPOFOL;  Surgeon: Ladene Artist, MD;  Location: [facility];  Service: Endoscopy;  Laterality: N/A;   FOOT SURGERY Right    pain scraper several inches into foot   HEMOSTASIS CLIP PLACEMENT  01/31/2021   Procedure: HEMOSTASIS CLIP PLACEMENT;  Surgeon: Ladene Artist, MD;  Location: [facility];  Service: Endoscopy;;   POLYPECTOMY  2018   piecemeal polyps TAx 2   WISDOM TOOTH EXTRACTION      Prior to Admission  medications   Medication Sig Start Date End Date Taking? Authorizing Provider  amLODipine (NORVASC) 2.5 MG tablet TAKE 1 TABLET(2.5 MG) BY MOUTH DAILY 10/05/20  Yes Marin Olp, MD  aspirin 81 MG tablet Take 1 tablet (81 mg total) by mouth daily. 02/22/21  Yes Mercy Riding, MD  cetirizine (ZYRTEC) 10 MG tablet Take 10 mg by mouth daily.   Yes [provider]  gabapentin (NEURONTIN) 300 MG capsule TAKE 1 CAPSULE(300 MG) BY MOUTH TWICE DAILY 05/17/21  Yes Magnus Sinning, MD  irbesartan-hydrochlorothiazide (AVALIDE) 300-12.5 MG tablet TAKE 1 TABLET BY MOUTH DAILY 07/06/21  Yes Marin Olp, MD  MULTIPLE VITAMIN PO Take 1 tablet by mouth daily.   Yes [provider]  Omega-3 Fatty Acids (FISH OIL) 1000 MG CAPS Take 2 capsules by mouth daily.   Yes [provider]  omeprazole (PRILOSEC) 40 MG capsule TAKE 1 CAPSULE BY MOUTH EVERY DAY 06/04/21  Yes Marin Olp, MD  rosuvastatin (CRESTOR) 5 MG tablet TAKE 1 TABLET BY MOUTH once a week 07/06/20  Yes Marin Olp, MD  tadalafil (CIALIS) 10 MG tablet Take 5 mg by mouth daily as needed for erectile dysfunction. 06/26/17  Yes [provider]  benzonatate (TESSALON) 200 MG capsule Take 1 capsule (  200 mg total) by mouth 2 (two) times daily as needed for cough. 06/21/21   Lucretia Kern, DO  Cyanocobalamin 2000 MCG/ML SOLN Inject 1,000 mcg as directed every 30 (thirty) days. Patient comes get his b12 injection every 3rd Thursday in the month.    [provider]  tizanidine (ZANAFLEX) 2 MG capsule TAKE 1 CAPSULE(2 MG) BY MOUTH TWICE DAILY AS NEEDED FOR MUSCLE SPASMS 02/25/20   Marin Olp, MD    Current Outpatient Medications  Medication Sig Dispense Refill   amLODipine (NORVASC) 2.5 MG tablet TAKE 1 TABLET(2.5 MG) BY MOUTH DAILY 90 tablet 3   aspirin 81 MG tablet Take 1 tablet (81 mg total) by mouth daily. 30 tablet    cetirizine (ZYRTEC) 10 MG tablet Take 10 mg by mouth daily.     gabapentin  (NEURONTIN) 300 MG capsule TAKE 1 CAPSULE(300 MG) BY MOUTH TWICE DAILY 120 capsule 1   irbesartan-hydrochlorothiazide (AVALIDE) 300-12.5 MG tablet TAKE 1 TABLET BY MOUTH DAILY 90 tablet 3   MULTIPLE VITAMIN PO Take 1 tablet by mouth daily.     Omega-3 Fatty Acids (FISH OIL) 1000 MG CAPS Take 2 capsules by mouth daily.     omeprazole (PRILOSEC) 40 MG capsule TAKE 1 CAPSULE BY MOUTH EVERY DAY 90 capsule 1   rosuvastatin (CRESTOR) 5 MG tablet TAKE 1 TABLET BY MOUTH once a week 13 tablet 3   tadalafil (CIALIS) 10 MG tablet Take 5 mg by mouth daily as needed for erectile dysfunction.     benzonatate (TESSALON) 200 MG capsule Take 1 capsule (200 mg total) by mouth 2 (two) times daily as needed for cough. 20 capsule 0   Cyanocobalamin 2000 MCG/ML SOLN Inject 1,000 mcg as directed every 30 (thirty) days. Patient comes get his b12 injection every 3rd Thursday in the month.     tizanidine (ZANAFLEX) 2 MG capsule TAKE 1 CAPSULE(2 MG) BY MOUTH TWICE DAILY AS NEEDED FOR MUSCLE SPASMS 40 capsule 2   Current Facility-Administered Medications  Medication Dose Route Frequency Provider Last Rate Last Admin   0.9 %  sodium chloride infusion  500 mL Intravenous Once Ladene Artist, MD        Allergies as of 09/05/2021 - Review Complete 09/05/2021  Allergen Reaction Noted   Atorvastatin  07/28/2015    Family History  Problem Relation Age of Onset   Cancer Father        lung, former smoker, brown lung in [place]   Dementia Mother    Hemochromatosis Brother    Colon cancer Neg Hx    Esophageal cancer Neg Hx    Rectal cancer Neg Hx    Stomach cancer Neg Hx    Colon polyps Neg Hx     Social History   Socioeconomic History   Marital status: Married    Spouse name: Not on file   Number of children: Not on file   Years of education: Not on file   Highest education level: Not on file  Occupational History   Not on file  Tobacco Use   Smoking status: Former    Packs/day: 1.00    Years: 15.00     Pack years: 15.00    Types: Cigarettes    Quit date: 10/21/1978    Years since quitting: 42.9   Smokeless tobacco: Never  Vaping Use   Vaping Use: Never used  Substance and Sexual Activity   Alcohol use: Yes    Alcohol/week: 14.0 standard drinks    Types:  14 Standard drinks or equivalent per week    Comment: 2 per day   Drug use: No   Sexual activity: Yes  Other Topics Concern   Not on file  Social History Narrative   Married (wife pt of Dr. Maudie Mercury). 5 children (2 by first wife, 3 stepkids), 11 granddaughters + 2 greatgrandsons.       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  Gen: Denies any fever, chills, sweats, anorexia, fatigue, weakness, malaise, weight loss, and sleep disorder CV: Denies chest pain, angina, palpitations, syncope, orthopnea, PND, peripheral edema, and claudication. Resp: Denies dyspnea at rest, dyspnea with exercise, cough, sputum, wheezing, coughing up blood, and pleurisy. GI: Denies vomiting blood, jaundice, and fecal incontinence.   Denies dysphagia or odynophagia. GU : Denies urinary burning, blood in urine, urinary frequency, urinary hesitancy, nocturnal urination, and urinary incontinence. MS: Denies joint pain, limitation of movement, and swelling, stiffness, low back pain, extremity pain. Denies muscle weakness, cramps, atrophy.  Derm: Denies rash, itching, dry skin, hives, moles, warts, or unhealing ulcers.  Psych: Denies depression, anxiety, memory loss, suicidal ideation, hallucinations, paranoia, and  confusion. Heme: Denies bruising, bleeding, and enlarged lymph nodes. Neuro:  Denies any headaches, dizziness, paresthesias. Endo:  Denies any problems with DM, thyroid, adrenal function.   Physical Exam: General:  Alert, well-developed, in NAD Head:  Normocephalic and atraumatic. Eyes:  Sclera clear, no icterus.   Conjunctiva pink. Ears:  Normal auditory acuity. Mouth:  No deformity or lesions.  Neck:  Supple; no masses . Lungs:  Clear throughout to auscultation.   No wheezes, crackles, or rhonchi. No acute distress. Heart:  Regular rate and rhythm; no murmurs. Abdomen:  Soft, nondistended, nontender. No masses, hepatomegaly. No obvious masses.  Normal bowel .    Rectal:  Deferred   Msk:  Symmetrical without gross deformities.. Pulses:  Normal pulses noted. Extremities:  Without edema. Neurologic:  Alert and  oriented x4;  grossly normal neurologically. Skin:  Intact without significant lesions or rashes. Cervical Nodes:  No significant cervical adenopathy. Psych:  Alert and cooperative. Normal mood and affect.   Impression / Plan:   Personal history of adenomatous colon polyps.  Piecemeal polypectomy of a 25 mm ascending colon adenomatous polyp 6 months ago for repeat colonoscopy.    Pricilla Riffle. Fuller Plan  09/05/2021, 8:06 AM See Shea Evans, [facility], to contact our on call provider

## 2021-09-05 NOTE — Op Note (Signed)
[place] Patient Name: Larry Williamson Procedure Date: 09/05/2021 7:19 AM MRN: [id number] Endoscopist: Ladene Artist , MD Age: 70 Referring MD:  Date of Birth: Oct 21, 1951 Gender: Male Account #: [id number] Procedure:                Colonoscopy Indications:              Surveillance: Personal history of piecemeal removal                            of large sessile adenoma on last colonoscopy 6                            months ago Medicines:                Monitored Anesthesia Care Procedure:                Pre-Anesthesia Assessment:                           - Prior to the procedure, a History and Physical                            was performed, and patient medications and                            allergies were reviewed. The patient's tolerance of                            previous anesthesia was also reviewed. The risks                            and benefits of the procedure and the sedation                            options and risks were discussed with the patient.                            All questions were answered, and informed consent                            was obtained. Prior Anticoagulants: The patient has                            taken no previous anticoagulant or antiplatelet                            agents. ASA Grade Assessment: II - A patient with                            mild systemic disease. After reviewing the risks                            and benefits, the patient was deemed in  satisfactory condition to undergo the procedure.                           After obtaining informed consent, the colonoscope                            was passed under direct vision. Throughout the                            procedure, the patient's blood pressure, pulse, and                            oxygen saturations were monitored continuously. The                            #[id number] Olympus [id number] was introduced through                             the anus and advanced to the the cecum, identified                            by appendiceal orifice and ileocecal valve. The                            ileocecal valve, appendiceal orifice, and rectum                            were photographed. The quality of the bowel                            preparation was good. The colonoscopy was performed                            without difficulty. The patient tolerated the                            procedure well. Scope In: 8:09:05 AM Scope Out: 8:24:55 AM Scope Withdrawal Time: 0 hours 13 minutes 40 seconds  Total Procedure Duration: 0 hours 15 minutes 50 seconds  Findings:                 The perianal and digital rectal examinations were                            normal.                           Four sessile polyps were found in the transverse                            colon (2) and ascending colon (2). The polyps were                            5 to 7 mm in size. These polyps were removed with a  cold snare. Resection and retrieval were complete.                           Multiple medium-mouthed diverticula were found in                            the left colon. There was narrowing of the colon in                            association with the diverticular opening. There                            was evidence of diverticular spasm. There was no                            evidence of diverticular bleeding.                           Internal hemorrhoids, small hypertrophied anal                            papilla were found during retroflexion. The                            hemorrhoids were small and Grade I (internal                            hemorrhoids that do not prolapse).                           The exam was otherwise without abnormality on                            direct and retroflexion views. Complications:            No immediate complications. Estimated blood  loss:                            None. Estimated Blood Loss:     Estimated blood loss: none. Impression:               - Four 5 to 7 mm polyps in the transverse colon and                            in the ascending colon, removed with a cold snare.                            Resected and retrieved.                           - Moderate diverticulosis in the left colon.                           - Internal hemorrhoids and a small hypertrophied  anal papilla.                           - The examination was otherwise normal on direct                            and retroflexion views. Recommendation:           - Repeat colonoscopy, likely 3 years, after studies                            are complete for surveillance based on pathology                            results.                           - Patient has a contact number available for                            emergencies. The signs and symptoms of potential                            delayed complications were discussed with the                            patient. Return to normal activities tomorrow.                            Written discharge instructions were provided to the                            patient.                           - Hihg fiber diet.                           - Continue present medications.                           - Await pathology results.                           - No aspirin, ibuprofen, naproxen, or other                            non-steroidal anti-inflammatory drugs for 2 weeks                            after polyp removal. Ladene Artist, MD 09/05/2021 8:29:18 AM This report has been signed electronically.

## 2021-09-07 ENCOUNTER — Telehealth: Payer: Self-pay

## 2021-09-07 NOTE — Telephone Encounter (Signed)
Left message on follow up call. 

## 2021-09-07 NOTE — Telephone Encounter (Signed)
  Follow up Call-  Call back number 09/05/2021 01/30/2021  Post procedure Call Back phone  # [phone]  Permission to leave phone message Yes Yes  Some recent data might be hidden     Patient questions:  Do you have a fever, pain , or abdominal swelling? No. Pain Score  0 *  Have you tolerated food without any problems? Yes.    Have you been able to return to your normal activities? Yes.    Do you have any questions about your discharge instructions: Diet   No. Medications  No. Follow up visit  No.  Do you have questions or concerns about your Care? No.  Actions: * If pain score is 4 or above: No action needed, pain <4.  Have you developed a fever since your procedure? no  2.   Have you had an respiratory symptoms (SOB or cough) since your procedure? no  3.   Have you tested positive for COVID 19 since your procedure no  4.   Have you had any family members/close contacts diagnosed with the COVID 19 since your procedure?  no   If yes to any of these questions please route to Joylene John, RN and Joella Prince, RN

## 2021-09-18 ENCOUNTER — Encounter: Payer: Self-pay | Admitting: Gastroenterology

## 2021-09-24 NOTE — Progress Notes (Signed)
Chronic Care Management Pharmacy Note  09/25/2021 Name:  Larry Williamson MRN:  [id number] DOB:  [birth year]  Summary: Recommended patient get fasting lipid panel since we have not done sine early 2021.  He is agreeable and willing to increase to twice weekly Crestor if we need to. ________ Subjective: Larry Williamson is an 70 y.o. year old male who is a primary patient of Hunter, Brayton Mars, MD.  The CCM team was consulted for assistance with disease management and care coordination needs.    Engaged with patient by telephone for follow up visit in response to provider referral for pharmacy case management and/or care coordination services.   Consent to Services:  The patient was given information about Chronic Care Management services, agreed to services, and gave verbal consent prior to initiation of services.  Please see initial visit note for detailed documentation.   Patient Care Team: Marin Olp, MD as PCP - General (Family Medicine) Lavonna Monarch, MD as Consulting Physician (Dermatology) Edythe Clarity, [facility] (Pharmacist)  Recent office visits:  06/21/21 Colin Benton, DO [facility] Cold symptoms, Started Benzonatate 250m 1 Cap by mouth 2 times daily as needed for cough. Follow up as needed.    Recent consult visits:  None   Hospital visits:  None   Objective: Lab Results  Component Value Date   CREATININE 1.13 02/15/2021   CREATININE 1.14 01/31/2021   CREATININE 0.94 01/30/2021    Lab Results  Component Value Date   HGBA1C 5.1 10/26/2018   Last diabetic Eye exam: No results found for: HMDIABEYEEXA  Last diabetic Foot exam: No results found for: HMDIABFOOTEX      Component Value Date/Time   CHOL 189 10/28/2019 0838   TRIG 159.0 (H) 10/28/2019 0838   HDL 43.10 10/28/2019 0838   CHOLHDL 4 10/28/2019 0838   VLDL 31.8 10/28/2019 0838   LDLCALC 114 (H) 10/28/2019 0838   LDLDIRECT 116 (H) 07/06/2020 0849    Hepatic Function Latest Ref Rng & Units  02/15/2021 01/31/2021 01/30/2021  Total Protein 6.0 - 8.3 g/dL 7.0 6.4(L) 7.3  Albumin 3.5 - 5.2 g/dL 4.2 3.5 4.1  AST 0 - 37 U/L 34 36 45(H)  ALT 0 - 53 U/L 41 47(H) 56(H)  Alk Phosphatase 39 - 117 U/L 33(L) 29(L) 34(L)  Total Bilirubin 0.2 - 1.2 mg/dL 0.6 1.3(H) 1.3(H)  Bilirubin, Direct 0.0 - 0.3 mg/dL - - -    Lab Results  Component Value Date/Time   TSH 2.56 07/06/2015 08:01 AM   TSH 2.49 07/04/2014 07:58 AM    CBC Latest Ref Rng & Units 02/15/2021 02/08/2021 02/01/2021  WBC 4.0 - 10.5 K/uL 8.1 7.1 -  Hemoglobin 13.0 - 17.0 g/dL 12.4(L) 11.7(L) 10.6(L)  Hematocrit 39.0 - 52.0 % 36.0(L) 33.9(L) 31.0(L)  Platelets 150.0 - 400.0 K/uL 228.0 229.0 -    No results found for: VD25OH  Clinical ASCVD:  The 10-year ASCVD risk score (Arnett DK, et al., 2019) is: 30.6%   Values used to calculate the score:     Age: [age]years     Sex: Male     Is Non-Hispanic African American: No     Diabetic: No     Tobacco smoker: Yes     Systolic Blood Pressure: 1774mmHg     Is BP treated: Yes     HDL Cholesterol: 43.1 mg/dL     Total Cholesterol: 189 mg/dL    Other: (CHADS2VASc if Afib, PHQ9 if depression, MMRC or CAT for  COPD, ACT, DEXA)  Social History   Tobacco Use  Smoking Status Former   Packs/day: 1.00   Years: 15.00   Pack years: 15.00   Types: Cigarettes   Quit date: 10/21/1978   Years since quitting: 42.9  Smokeless Tobacco Never   BP Readings from Last 3 Encounters:  09/05/21 139/67  02/15/21 120/64  02/01/21 121/64   Pulse Readings from Last 3 Encounters:  09/05/21 69  02/15/21 77  02/01/21 67   Wt Readings from Last 3 Encounters:  09/05/21 233 lb (105.7 kg)  08/29/21 233 lb (105.7 kg)  02/15/21 233 lb 3.2 oz (105.8 kg)    Assessment: Review of patient past medical history, allergies, medications, health status, including review of consultants reports, laboratory and other test data, was performed as part of comprehensive evaluation and provision of chronic care  management services.   SDOH:  (Social Determinants of Health) assessments and interventions performed: Yes.  CCM Care Plan  Allergies  Allergen Reactions   Atorvastatin     Other reaction(s): Cramp    Medications Reviewed Today     Reviewed by Davis, Christian L, RPH (Pharmacist) on 09/25/21 at 1558  Med List Status: <None>   Medication Order Taking? Sig Documenting Provider Last Dose Status Informant  amLODipine (NORVASC) 2.5 MG tablet [id number] Yes TAKE 1 TABLET(2.5 MG) BY MOUTH DAILY Hunter, Stephen O, MD Taking Active Self  aspirin 81 MG tablet [id number] Yes Take 1 tablet (81 mg total) by mouth daily. Gonfa, Taye T, MD Taking Active   benzonatate (TESSALON) 200 MG capsule [id number]  Take 1 capsule (200 mg total) by mouth 2 (two) times daily as needed for cough. Kim, Hannah R, DO  Active   cetirizine (ZYRTEC) 10 MG tablet [id number] Yes Take 10 mg by mouth daily. [provider] Taking Active Self  Cyanocobalamin 2000 MCG/ML SOLN [id number] Yes Inject 1,000 mcg as directed every 30 (thirty) days. Patient comes get his b12 injection every 3rd Thursday in the month. [provider] Taking Active   gabapentin (NEURONTIN) 300 MG capsule [id number] Yes TAKE 1 CAPSULE(300 MG) BY MOUTH TWICE DAILY Newton, Frederic, MD Taking Active   irbesartan-hydrochlorothiazide (AVALIDE) 300-12.5 MG tablet [id number] Yes TAKE 1 TABLET BY MOUTH DAILY Hunter, Stephen O, MD Taking Active   MULTIPLE VITAMIN PO [id number] Yes Take 1 tablet by mouth daily. [provider] Taking Active Self  Omega-3 Fatty Acids (FISH OIL) 1000 MG CAPS [id number] Yes Take 2 capsules by mouth daily. [provider] Taking Active Self  omeprazole (PRILOSEC) 40 MG capsule [id number] Yes TAKE 1 CAPSULE BY MOUTH EVERY DAY Hunter, Stephen O, MD Taking Active   rosuvastatin (CRESTOR) 5 MG tablet [id number] Yes TAKE 1 TABLET BY MOUTH once a week Hunter, Stephen O, MD Taking Active Self  tadalafil (CIALIS) 10 MG  tablet [id number] Yes Take 5 mg by mouth daily as needed for erectile dysfunction. [provider] Taking Active Self  tizanidine (ZANAFLEX) 2 MG capsule [id number] Yes TAKE 1 CAPSULE(2 MG) BY MOUTH TWICE DAILY AS NEEDED FOR MUSCLE SPASMS Hunter, Stephen O, MD Taking Active Self           Patient Active Problem List   Diagnosis Date Noted   Lower GI bleed 01/30/2021   Myalgia due to statin 07/06/2020   B12 deficiency 12/03/2019   Dupuytren's contracture of right hand 10/28/2019   Hyperglycemia 10/01/2016   AAA (abdominal aortic aneurysm) 01/18/2016   Meralgia paresthetica 12/28/2014   History of skin cancer 12/28/2014     Former smoker 12/28/2014   Abnormal liver enzymes 12/29/2010   Allergic rhinitis 06/17/2008   ERECTILE DYSFUNCTION 12/07/2007   History of colonic polyps 12/07/2007   Umbilical hernia 05/28/2007   Hyperlipidemia 05/15/2007   Essential hypertension 05/15/2007   GERD 05/15/2007    Immunization History  Administered Date(s) Administered   Fluad Quad(high Dose 65+) 06/22/2019, 07/06/2020, 07/05/2021   Influenza Split 07/30/2011, 07/10/2012   Influenza Whole 08/04/2007, 07/21/2008, 07/18/2009, 07/16/2010   Influenza, High Dose Seasonal PF 07/16/2016, 07/17/2017, 07/09/2018   Influenza,inj,Quad PF,6+ Mos 07/05/2013, 08/03/2014, 07/13/2015   Influenza-Unspecified 06/22/2019, 07/06/2020   PFIZER(Purple Top)SARS-COV-2 Vaccination 11/21/2019, 12/12/2019, 10/03/2020   Pneumococcal Conjugate-13 01/09/2016   Pneumococcal Polysaccharide-23 04/01/2017   Td 10/22/1999, 12/11/2009   Conditions to be addressed/monitored: AAA Hx, HLD, HTN, GI bleed Hx, B12 deficiency, GERD, ED     Care Plan : CCM Pharmacy Care Plan  Updates made by Davis, Christian L, RPH since 09/25/2021 12:00 AM     Problem: Disease Management   Priority: High     Long-Range Goal: Patient-Specific Goal   Start Date: 03/21/2021  Expected End Date: 03/21/2022  Recent Progress: On track   Priority: High  Note:   Current Barriers:  Unable to maintain control of HLD  Pharmacist Clinical Goal(s):  Patient will contact provider office for questions/concerns as evidenced notation of same in electronic health record through collaboration with PharmD and provider.   Interventions: 1:1 collaboration with Hunter, Stephen O, MD regarding development and update of comprehensive plan of care as evidenced by provider attestation and co-signature Inter-disciplinary care team collaboration (see longitudinal plan of care) Comprehensive medication review performed; medication list updated in electronic medical record No Rx Changes  Hypertension (BP goal <140/90) -Controlled -Current treatment: Amlodipine 2.5 mg once daily Irbesartan-HCTZ 300-12.5 mg once daily   -Current home readings: did not provide today -Current dietary habits: 'no changes'  -Current exercise habits: no formal exercise, works every day  -Denies hypotensive/hypertensive symptoms -Counseled to monitor BP at home at least 1-2x per month, document, and provide log at future appointments -Counseled on diet and exercise extensively Recommended to continue current medication  Update 09/25/21 127/78 most recent home blood pressure He reports it is normally 130s systolic.   Denies any dizziness or headaches at home. No changes to meds reports 100% adherence.  He has lost about 20 pounds over the last 5 months or so.  Mainly cutting back on portion sizes.  He also cut back on potatoes, rice, and other starches.  Plans to continue this lifestyle for the long term.  Congratulated on success with weight loss! BP in office controlled the last few visits. Discussed implementing exercise routine as secondary lifestyle modification. No changes needed at this time, continue current meds.  Continue home monitoring. Will FU on BP in about 3 months.   Hyperlipidemia: (LDL goal < 70) -Uncontrolled.  -Current  treatment: Crestor 5 mg once weekly  -Medications previously tried: myalgia, several statin trials - atorvastatin, pravastatin  -Educated on Benefits of statin for ASCVD risk reduction; Reports to tolerate rosuvastatin 5 mg once weekly - reviewed cholesterol goals at length with patient, could consider zetia (no hx on file)  -Did not review alcohol intake today  -Counseled on diet and exercise extensively Recommended to continue current medication   Update 09/25/21 Continues on once weekly Crestor dose.  Last LDL elevated in January of 2021.  He has history of myalgias on atorvastatin and pravastatin.  He is coming in two days for B12 vaccine,   plans to get fasting lipid panel at that time if able to.  He is agreeable to increase to twice weekly if we need to to better control lipids.   No changes at this time, consider increase should we need to pending fasting labs.  GERD (Goal: minimize symptoms) -Controlled  -No progress noted in terms of weight loss, reflux controlled if taking daily. -Previous GI bleed following polyp removal, gastric ulcer noted in 1992 -Current treatment  Omeprazole 40 mg once daily  -Medications previously tried: failed omeprazole reduction to 20 mg - previous goal to get weight below 200 before considering   -Recommended to continue current medication  Patient Goals/Self-Care Activities Patient will:  - take medications as prescribed  Follow Up Plan: FU on fasting lipids, HC FU on BP in 90 days   Medication Assistance: None required.  Patient affirms current coverage meets needs.       Patient's preferred pharmacy is:  [facility] [place], [address] [place] [address] Phone: [phone] Fax: [phone]  Follow Up:  Patient agrees to Care Plan and Follow-up.  Future Appointments  Date Time Provider [place]  09/27/2021  9:00 AM [facility] NURSE  [facility]  07/17/2022  7:30 AM Lavonna Monarch, MD [facility], PharmD Clinical Pharmacist  [facility] [phone]

## 2021-09-25 ENCOUNTER — Ambulatory Visit: Payer: Medicare Other | Admitting: Pharmacist

## 2021-09-25 DIAGNOSIS — E785 Hyperlipidemia, unspecified: Secondary | ICD-10-CM

## 2021-09-25 DIAGNOSIS — I10 Essential (primary) hypertension: Secondary | ICD-10-CM

## 2021-09-25 NOTE — Patient Instructions (Addendum)
Visit Information   Goals Addressed             This Visit's Progress    LDL < 100       Timeframe:  Long-Range Goal Priority:  High Start Date:  09/25/21                           Expected End Date:  03/26/22      Follow Up Date 12/24/21    - Get fasting lipid panel. Goal LDL < 100.   Why is this important?   These steps will help you keep on track with your medicines.   Notes: Willing to increase statin to twice weekly       Patient Care Plan: [place] Plan     Problem Identified: Disease Management   Priority: High     Long-Range Goal: Patient-Specific Goal   Start Date: 03/21/2021  Expected End Date: 03/21/2022  Recent Progress: On track  Priority: High  Note:   Current Barriers:  Unable to maintain control of HLD  Pharmacist Clinical Goal(s):  Patient will contact provider office for questions/concerns as evidenced notation of same in electronic health record through collaboration with PharmD and provider.   Interventions: 1:1 collaboration with Marin Olp, MD regarding development and update of comprehensive plan of care as evidenced by provider attestation and co-signature Inter-disciplinary care team collaboration (see longitudinal plan of care) Comprehensive medication review performed; medication list updated in electronic medical record No Rx Changes  Hypertension (BP goal <140/90) -Controlled -Current treatment: Amlodipine 2.5 mg once daily Irbesartan-HCTZ 300-12.5 mg once daily   -Current home readings: did not provide today -Current dietary habits: 'no changes'  -Current exercise habits: no formal exercise, works every day  -Denies hypotensive/hypertensive symptoms -Counseled to monitor BP at home at least 1-2x per month, document, and provide log at future appointments -Counseled on diet and exercise extensively Recommended to continue current medication  Update 09/25/21 127/78 most recent home blood pressure He reports it  is normally 836O systolic.   Denies any dizziness or headaches at home. No changes to meds reports 100% adherence.  He has lost about 20 pounds over the last 5 months or so.  Mainly cutting back on portion sizes.  He also cut back on potatoes, rice, and other starches.  Plans to continue this lifestyle for the long term.  Congratulated on success with weight loss! BP in office controlled the last few visits. Discussed implementing exercise routine as secondary lifestyle modification. No changes needed at this time, continue current meds.  Continue home monitoring. Will FU on BP in about 3 months.   Hyperlipidemia: (LDL goal < 70) -Uncontrolled.  -Current treatment: Crestor 5 mg once weekly  -Medications previously tried: myalgia, several statin trials - atorvastatin, pravastatin  -Educated on Benefits of statin for ASCVD risk reduction; Reports to tolerate rosuvastatin 5 mg once weekly - reviewed cholesterol goals at length with patient, could consider zetia (no hx on file)  -Did not review alcohol intake today  -Counseled on diet and exercise extensively Recommended to continue current medication   Update 09/25/21 Continues on once weekly Crestor dose.  Last LDL elevated in January of 2021.  He has history of myalgias on atorvastatin and pravastatin.  He is coming in two days for B12 vaccine, plans to get fasting lipid panel at that time if able to.  He is agreeable to increase to twice weekly if we need  to to better control lipids.   No changes at this time, consider increase should we need to pending fasting labs.  GERD (Goal: minimize symptoms) -Controlled  -No progress noted in terms of weight loss, reflux controlled if taking daily. -Previous GI bleed following polyp removal, gastric ulcer noted in 1992 -Current treatment  Omeprazole 40 mg once daily  -Medications previously tried: failed omeprazole reduction to 20 mg - previous goal to get weight below 200 before considering    -Recommended to continue current medication  Patient Goals/Self-Care Activities Patient will:  - take medications as prescribed  Follow Up Plan: FU on fasting lipids, HC FU on BP in 90 days   Medication Assistance: None required.  Patient affirms current coverage meets needs.         Patient verbalizes understanding of instructions provided today and agrees to view in [place].  Telephone follow up appointment with pharmacy team member scheduled for: 6 months  Edythe Clarity, [place]

## 2021-09-27 ENCOUNTER — Ambulatory Visit: Payer: Medicare Other

## 2021-09-28 ENCOUNTER — Ambulatory Visit (INDEPENDENT_AMBULATORY_CARE_PROVIDER_SITE_OTHER): Payer: Medicare Other

## 2021-09-28 ENCOUNTER — Other Ambulatory Visit (INDEPENDENT_AMBULATORY_CARE_PROVIDER_SITE_OTHER): Payer: Medicare Other

## 2021-09-28 ENCOUNTER — Other Ambulatory Visit: Payer: Self-pay

## 2021-09-28 DIAGNOSIS — E538 Deficiency of other specified B group vitamins: Secondary | ICD-10-CM

## 2021-09-28 DIAGNOSIS — E785 Hyperlipidemia, unspecified: Secondary | ICD-10-CM

## 2021-09-28 LAB — LIPID PANEL
Cholesterol: 158 mg/dL (ref 0–200)
HDL: 45.6 mg/dL (ref >39.00)
LDL Cholesterol: 91 mg/dL (ref 0–99)
NonHDL: 112.09
Total CHOL/HDL Ratio: 3
Triglycerides: 104 mg/dL (ref 0.0–149.0)
VLDL: 20.8 mg/dL (ref 0.0–40.0)

## 2021-09-28 MED ORDER — CYANOCOBALAMIN 1000 MCG/ML IJ SOLN
1000.0000 ug | Freq: Once | INTRAMUSCULAR | Status: AC
  Administered 2021-09-28: 1000 ug via INTRAMUSCULAR

## 2021-10-02 ENCOUNTER — Telehealth: Payer: Self-pay

## 2021-10-02 ENCOUNTER — Other Ambulatory Visit: Payer: Self-pay

## 2021-10-02 MED ORDER — ROSUVASTATIN CALCIUM 5 MG PO TABS: 5.0000 mg | ORAL_TABLET | ORAL | 3 refills | Status: DC

## 2021-10-02 NOTE — Telephone Encounter (Signed)
Called and spoke with pharmacy and clarified the Rx.

## 2021-10-02 NOTE — Telephone Encounter (Signed)
Received a call from [facility] they are needing clarification with rosuvastatin (CRESTOR) 5 MG tablet.

## 2021-10-04 ENCOUNTER — Telehealth: Payer: Self-pay | Admitting: Pharmacist

## 2021-10-04 NOTE — Progress Notes (Signed)
Please co-sign this encounter.

## 2021-10-04 NOTE — Chronic Care Management (AMB) (Signed)
° ° °  Chronic Care Management Pharmacy Assistant   Name: Larry Williamson  MRN: [id number] DOB: Oct 31, 1950   Reason for Encounter: General Adherence Call    Recent office visits:  None  Recent consult visits:  None  Hospital visits:  None in previous 6 months  Medications: Outpatient Encounter Medications as of 10/04/2021  Medication Sig   amLODipine (NORVASC) 2.5 MG tablet TAKE 1 TABLET(2.5 MG) BY MOUTH DAILY   aspirin 81 MG tablet Take 1 tablet (81 mg total) by mouth daily.   benzonatate (TESSALON) 200 MG capsule Take 1 capsule (200 mg total) by mouth 2 (two) times daily as needed for cough.   cetirizine (ZYRTEC) 10 MG tablet Take 10 mg by mouth daily.   Cyanocobalamin 2000 MCG/ML SOLN Inject 1,000 mcg as directed every 30 (thirty) days. Patient comes get his b12 injection every 3rd Thursday in the month.   gabapentin (NEURONTIN) 300 MG capsule TAKE 1 CAPSULE(300 MG) BY MOUTH TWICE DAILY   irbesartan-hydrochlorothiazide (AVALIDE) 300-12.5 MG tablet TAKE 1 TABLET BY MOUTH DAILY   MULTIPLE VITAMIN PO Take 1 tablet by mouth daily.   Omega-3 Fatty Acids (FISH OIL) 1000 MG CAPS Take 2 capsules by mouth daily.   omeprazole (PRILOSEC) 40 MG capsule TAKE 1 CAPSULE BY MOUTH EVERY DAY   rosuvastatin (CRESTOR) 5 MG tablet Take 1 tablet (5 mg total) by mouth 2 (two) times a week. TAKE 1 TABLET BY MOUTH once a week   tadalafil (CIALIS) 10 MG tablet Take 5 mg by mouth daily as needed for erectile dysfunction.   tizanidine (ZANAFLEX) 2 MG capsule TAKE 1 CAPSULE(2 MG) BY MOUTH TWICE DAILY AS NEEDED FOR MUSCLE SPASMS   No facility-administered encounter medications on file as of 10/04/2021.     I called and spoke briefly with Larry Williamson. He states he had his lipids done last week and they came out to be "really good." He states he started taking Crestor twice a week two weeks ago and so far he hasn't had any issues.  He denies having any issues or problems with any of his medications or  pharmacy.  He is overdue for a medicare annual wellness exam. He has scheduled this for 11/15/2021. Gap and Adherence Report has been updated.  Future Appointments  Date Time Provider [place]  11/15/2021  2:30 PM [facility] HEALTH COACH [facility]  04/01/2022  3:00 PM [facility] PHARMACIST [facility]  07/17/2022  7:30 AM Lavonna Monarch, MD [facility]    April D Calhoun, [place] Pharmacist Assistant [phone]

## 2021-10-09 ENCOUNTER — Other Ambulatory Visit: Payer: Self-pay | Admitting: Family Medicine

## 2021-10-11 ENCOUNTER — Emergency Department (HOSPITAL_BASED_OUTPATIENT_CLINIC_OR_DEPARTMENT_OTHER): Payer: Medicare Other

## 2021-10-11 ENCOUNTER — Encounter (HOSPITAL_BASED_OUTPATIENT_CLINIC_OR_DEPARTMENT_OTHER): Payer: Self-pay

## 2021-10-11 ENCOUNTER — Emergency Department (HOSPITAL_BASED_OUTPATIENT_CLINIC_OR_DEPARTMENT_OTHER)
Admission: EM | Admit: 2021-10-11 | Discharge: 2021-10-11 | Disposition: A | Discharge status: Home / Self Care | Payer: Medicare Other | Attending: Emergency Medicine | Admitting: Emergency Medicine

## 2021-10-11 ENCOUNTER — Other Ambulatory Visit: Payer: Self-pay

## 2021-10-11 DIAGNOSIS — Z85828 Personal history of other malignant neoplasm of skin: Secondary | ICD-10-CM | POA: Insufficient documentation

## 2021-10-11 DIAGNOSIS — Y92410 Unspecified street and highway as the place of occurrence of the external cause: Secondary | ICD-10-CM | POA: Diagnosis not present

## 2021-10-11 DIAGNOSIS — M542 Cervicalgia: Secondary | ICD-10-CM | POA: Diagnosis not present

## 2021-10-11 DIAGNOSIS — R519 Headache, unspecified: Secondary | ICD-10-CM | POA: Diagnosis not present

## 2021-10-11 DIAGNOSIS — M545 Low back pain, unspecified: Secondary | ICD-10-CM | POA: Diagnosis not present

## 2021-10-11 DIAGNOSIS — Z7982 Long term (current) use of aspirin: Secondary | ICD-10-CM | POA: Diagnosis not present

## 2021-10-11 DIAGNOSIS — I10 Essential (primary) hypertension: Secondary | ICD-10-CM | POA: Insufficient documentation

## 2021-10-11 DIAGNOSIS — Z79899 Other long term (current) drug therapy: Secondary | ICD-10-CM | POA: Diagnosis not present

## 2021-10-11 DIAGNOSIS — Z87891 Personal history of nicotine dependence: Secondary | ICD-10-CM | POA: Insufficient documentation

## 2021-10-11 DIAGNOSIS — M549 Dorsalgia, unspecified: Secondary | ICD-10-CM | POA: Diagnosis not present

## 2021-10-11 MED ORDER — ACETAMINOPHEN 325 MG PO TABS
650.0000 mg | ORAL_TABLET | Freq: Four times a day (QID) | ORAL | Status: DC | PRN
  Administered 2021-10-11: 17:00:00 650 mg via ORAL
  Filled 2021-10-11: qty 2

## 2021-10-11 MED ORDER — METHOCARBAMOL 500 MG PO TABS
500.0000 mg | ORAL_TABLET | Freq: Once | ORAL | Status: AC
  Administered 2021-10-11: 17:00:00 500 mg via ORAL
  Filled 2021-10-11: qty 1
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
## 2021-10-12 ENCOUNTER — Other Ambulatory Visit: Payer: Self-pay | Admitting: Physical Medicine and Rehabilitation

## 2021-11-02 DIAGNOSIS — M546 Pain in thoracic spine: Secondary | ICD-10-CM | POA: Diagnosis not present

## 2021-11-02 DIAGNOSIS — M9901 Segmental and somatic dysfunction of cervical region: Secondary | ICD-10-CM | POA: Diagnosis not present

## 2021-11-02 DIAGNOSIS — M9902 Segmental and somatic dysfunction of thoracic region: Secondary | ICD-10-CM | POA: Diagnosis not present

## 2021-11-02 DIAGNOSIS — M542 Cervicalgia: Secondary | ICD-10-CM | POA: Diagnosis not present

## 2021-11-05 DIAGNOSIS — M542 Cervicalgia: Secondary | ICD-10-CM | POA: Diagnosis not present

## 2021-11-05 DIAGNOSIS — M9901 Segmental and somatic dysfunction of cervical region: Secondary | ICD-10-CM | POA: Diagnosis not present

## 2021-11-05 DIAGNOSIS — M546 Pain in thoracic spine: Secondary | ICD-10-CM | POA: Diagnosis not present

## 2021-11-05 DIAGNOSIS — M9902 Segmental and somatic dysfunction of thoracic region: Secondary | ICD-10-CM | POA: Diagnosis not present

## 2021-11-08 ENCOUNTER — Other Ambulatory Visit: Payer: Self-pay

## 2021-11-08 ENCOUNTER — Ambulatory Visit (INDEPENDENT_AMBULATORY_CARE_PROVIDER_SITE_OTHER): Payer: Medicare Other | Admitting: Family Medicine

## 2021-11-08 DIAGNOSIS — E538 Deficiency of other specified B group vitamins: Secondary | ICD-10-CM | POA: Diagnosis not present

## 2021-11-08 MED ORDER — CYANOCOBALAMIN 1000 MCG/ML IJ SOLN
1000.0000 ug | Freq: Once | INTRAMUSCULAR | Status: AC
  Administered 2021-11-08: 1000 ug via INTRAMUSCULAR

## 2021-11-08 NOTE — Progress Notes (Signed)
Patient came in for B 12 injection per orders of Dr. Yong Channel. Injection given in right deltoid per patient preference. Patient tolerated well.  Larry Williamson, CMA

## 2021-11-09 DIAGNOSIS — M9901 Segmental and somatic dysfunction of cervical region: Secondary | ICD-10-CM | POA: Diagnosis not present

## 2021-11-09 DIAGNOSIS — M546 Pain in thoracic spine: Secondary | ICD-10-CM | POA: Diagnosis not present

## 2021-11-09 DIAGNOSIS — M542 Cervicalgia: Secondary | ICD-10-CM | POA: Diagnosis not present

## 2021-11-09 DIAGNOSIS — M9902 Segmental and somatic dysfunction of thoracic region: Secondary | ICD-10-CM | POA: Diagnosis not present

## 2021-11-15 ENCOUNTER — Other Ambulatory Visit: Payer: Self-pay

## 2021-11-15 ENCOUNTER — Ambulatory Visit (INDEPENDENT_AMBULATORY_CARE_PROVIDER_SITE_OTHER): Payer: Medicare Other

## 2021-11-15 VITALS — BP 147/82 | HR 72 | Temp 98.7°F | Wt 222.0 lb

## 2021-11-15 DIAGNOSIS — Z0000 Encounter for general adult medical examination without abnormal findings: Secondary | ICD-10-CM | POA: Diagnosis not present

## 2021-11-15 NOTE — Progress Notes (Signed)
Subjective:   Larry Williamson is a 71 y.o. male who presents for Medicare Annual/Subsequent preventive examination.  Review of Systems     Cardiac Risk Factors include: advanced age (>4men, >29 women);hypertension;dyslipidemia;male gender;obesity (BMI >30kg/m2)     Objective:    Today's Vitals   11/15/21 1427  BP: (!) 147/82  Pulse: 72  Temp: 98.7 F (37.1 C)  SpO2: 94%  Weight: 222 lb (100.7 kg)   Body mass index is 32.78 kg/m.  Advanced Directives 11/15/2021 10/11/2021 01/30/2021 01/30/2021 01/03/2020 07/09/2018 10/27/2017  Does Patient Have a Medical Advance Directive? Yes No Yes Yes Yes Yes Yes  Type of [employer] [place];Living will [place];Living will Living will;Healthcare Power of [place]  Does patient want to make changes to medical advance directive? - - No - Patient declined - No - Patient declined - -  Copy of [place] in Chart? No - copy requested - No - copy requested - No - copy requested - -    Current Medications (verified) Outpatient Encounter Medications as of 11/15/2021  Medication Sig   amLODipine (NORVASC) 2.5 MG tablet TAKE 1 TABLET(2.5 MG) BY MOUTH DAILY   aspirin 81 MG tablet Take 1 tablet (81 mg total) by mouth daily.   cetirizine (ZYRTEC) 10 MG tablet Take 10 mg by mouth daily.   Cyanocobalamin 2000 MCG/ML SOLN Inject 1,000 mcg as directed every 30 (thirty) days. Patient comes get his b12 injection every 3rd Thursday in the month.   gabapentin (NEURONTIN) 300 MG capsule TAKE 1 CAPSULE(300 MG) BY MOUTH TWICE DAILY   irbesartan-hydrochlorothiazide (AVALIDE) 300-12.5 MG tablet TAKE 1 TABLET BY MOUTH DAILY   MULTIPLE VITAMIN PO Take 1 tablet by mouth daily.   Omega-3 Fatty Acids (FISH OIL) 1000 MG CAPS Take 2 capsules by mouth daily.   omeprazole (PRILOSEC) 40 MG capsule TAKE 1 CAPSULE BY MOUTH EVERY DAY   rosuvastatin  (CRESTOR) 5 MG tablet Take 1 tablet (5 mg total) by mouth 2 (two) times a week. TAKE 1 TABLET BY MOUTH once a week   tadalafil (CIALIS) 10 MG tablet Take 5 mg by mouth daily as needed for erectile dysfunction.   tizanidine (ZANAFLEX) 2 MG capsule TAKE 1 CAPSULE(2 MG) BY MOUTH TWICE DAILY AS NEEDED FOR MUSCLE SPASMS   [DISCONTINUED] benzonatate (TESSALON) 200 MG capsule Take 1 capsule (200 mg total) by mouth 2 (two) times daily as needed for cough.   No facility-administered encounter medications on file as of 11/15/2021.    Allergies (verified) Atorvastatin   History: Past Medical History:  Diagnosis Date   Adenomatous colon polyp 06/2006   ALLERGIC RHINITIS    Allergy    seasonal allergies   Anemia    hx of   ANEMIA DUE TO DIETARY IRON DEFICIENCY 05/28/2007   Due to giving regularly giving blood. Resolved with cutting in half.      COMPRESSION FRACTURE, THORACIC VERTEBRA 03/09/2008   2009, no chronic pain    Diverticulitis of colon    DIVERTICULOSIS, COLON 05/28/2007   Qualifier: Diagnosis of  By: Arnoldo Morale MD, Balinda Quails    GERD (gastroesophageal reflux disease)    on meds   Hyperlipidemia    on meds   Hypertension    on meds   Melanoma ([place]) 02/23/2014   MM in situe, early, low mid back, exc   Squamous cell carcinoma in situ 11/10/2018   Top of  right shoulder, CX3, 5FU   Squamous cell carcinoma of skin    SCC IN SITU TOP OF RIGHT SHOULDER TX CX3 5FU   Ulcer 1992   gastric   Past Surgical History:  Procedure Laterality Date   COLONOSCOPY  01/2017   hx polyps/tics/hems   COLONOSCOPY  2019   MS-MC-TICS/int hems/3 yr recall-   COLONOSCOPY W/ POLYPECTOMY  01/30/2021   Dr.Stark   COLONOSCOPY WITH PROPOFOL N/A 01/31/2021   Procedure: COLONOSCOPY WITH PROPOFOL;  Surgeon: Ladene Artist, MD;  Location: [facility];  Service: Endoscopy;  Laterality: N/A;   FOOT SURGERY Right    pain scraper several inches into foot   HEMOSTASIS CLIP PLACEMENT  01/31/2021   Procedure: HEMOSTASIS  CLIP PLACEMENT;  Surgeon: Ladene Artist, MD;  Location: [facility];  Service: Endoscopy;;   POLYPECTOMY  2018   piecemeal polyps TAx 2   WISDOM TOOTH EXTRACTION     Family History  Problem Relation Age of Onset   Cancer Father        lung, former smoker, [place] lung in [place]   Dementia Mother    Hemochromatosis Brother    Colon cancer Neg Hx    Esophageal cancer Neg Hx    Rectal cancer Neg Hx    Stomach cancer Neg Hx    Colon polyps Neg Hx    Social History   Socioeconomic History   Marital status: Married    Spouse name: Not on file   Number of children: Not on file   Years of education: Not on file   Highest education level: Not on file  Occupational History   Not on file  Tobacco Use   Smoking status: Former    Packs/day: 1.00    Years: 15.00    Pack years: 15.00    Types: Cigarettes    Quit date: 10/21/1978    Years since quitting: 43.0   Smokeless tobacco: Never  Vaping Use   Vaping Use: Never used  Substance and Sexual Activity   Alcohol use: Yes    Alcohol/week: 14.0 standard drinks    Types: 14 Standard drinks or equivalent per week    Comment: 2 per day   Drug use: No   Sexual activity: Yes  Other Topics Concern   Not on file  Social History Narrative   Married (wife pt of Dr. Maudie Mercury). 5 children (2 by first wife, 3 stepkids), 11 granddaughters + 2 greatgrandsons.       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)      Hobbies: race cars, Designer, fashion/clothing signed on 03/15/10   Social Determinants of Health   Financial Resource Strain: Low Risk    Difficulty of Paying Living Expenses: Not hard at all  Food Insecurity: No Food Insecurity   Worried About Charity fundraiser in the Last Year: Never true   Arboriculturist in the Last Year: Never true  Transportation Needs: No Transportation Needs   Lack of Transportation (Medical): No   Lack of Transportation (Non-Medical): No  Physical Activity: Inactive   Days  of Exercise per Week: 0 days   Minutes of Exercise per Session: 0 min  Stress: No Stress Concern Present   Feeling of Stress : Not at all  Social Connections: Moderately Integrated   Frequency of Communication with Friends and Family: More than three times a week   Frequency of Social Gatherings with Friends and Family: More than  three times a week   Attends Religious Services: More than 4 times per year   Active Member of Clubs or Organizations: No   Attends Archivist Meetings: Never   Marital Status: Married    Tobacco Counseling Counseling given: Not Answered   Clinical Intake:  Pre-visit preparation completed: Yes  Pain : No/denies pain     BMI - recorded: 32.78 Nutritional Status: BMI > 30  Obese Nutritional Risks: None Diabetes: No  How often do you need to have someone help you when you read instructions, pamphlets, or other written materials from your doctor or pharmacy?: 1 - Never  Diabetic?No  Interpreter Needed?: No  Information entered by :: Charlott Rakes, LPN   Activities of Daily Living In your present state of health, do you have any difficulty performing the following activities: 11/15/2021 01/30/2021  Hearing? N -  Vision? N -  Difficulty concentrating or making decisions? N -  Walking or climbing stairs? N -  Dressing or bathing? N -  Doing errands, shopping? N N  Preparing Food and eating ? N -  Using the Toilet? N -  In the past six months, have you accidently leaked urine? N -  Do you have problems with loss of bowel control? N -  Managing your Medications? N -  Managing your Finances? N -  Housekeeping or managing your Housekeeping? N -  Some recent data might be hidden    Patient Care Team: Marin Olp, MD as PCP - General (Family Medicine) Lavonna Monarch, MD as Consulting Physician (Dermatology) Edythe Clarity, [facility] (Pharmacist)  Indicate any recent Medical Services you may have received from other than [facility]  providers in the past year (date may be approximate).     Assessment:   This is a routine wellness examination for [place].  Hearing/Vision screen Hearing Screening - Comments:: Pt denies hearing issues  Vision Screening - Comments:: Pt follows up with provider at [facility] for annual eye exams   Dietary issues and exercise activities discussed: Current Exercise Habits: The patient has a physically strenuous job, but has no regular exercise apart from work.   Goals Addressed             This Visit's Progress    Patient Stated       Living to be 89       Depression Screen PHQ 2/9 Scores 11/15/2021 02/15/2021 01/03/2020 10/28/2019 06/22/2019 10/26/2018 07/09/2018  PHQ - 2 Score 0 0 0 0 0 0 0    Fall Risk Fall Risk  11/15/2021 02/15/2021 01/03/2020 12/03/2019 06/22/2019  Falls in the past year? 0 0 0 0 0  Number falls in past yr: 0 0 0 0 0  Injury with Fall? 0 0 0 0 0  Risk for fall due to : Impaired vision - - - -  Follow up Falls prevention discussed - Education provided;Falls prevention discussed;Falls evaluation completed - -    FALL RISK PREVENTION PERTAINING TO THE HOME:  Any stairs in or around the home? Yes  If so, are there any without handrails? No  Home free of loose throw rugs in walkways, pet beds, electrical cords, etc? Yes  Adequate lighting in your home to reduce risk of falls? Yes   ASSISTIVE DEVICES UTILIZED TO PREVENT FALLS:  Life alert? No  Use of a cane, walker or w/c? No  Grab bars in the bathroom? No  Shower chair or bench in shower? Yes  Elevated toilet seat or a handicapped  toilet? No   TIMED UP AND GO:  Was the test performed? Yes .  Length of time to ambulate 10 feet: 10 sec.   Gait steady and fast without use of assistive device  Cognitive Function: MMSE - Mini Mental State Exam 07/09/2018  Not completed: (No Data)     6CIT Screen 11/15/2021 01/03/2020  What Year? 0 points 0 points  What month? 0 points 0 points  What time? 0 points 0 points  Count  back from 20 0 points 0 points  Months in reverse 0 points 0 points  Repeat phrase 0 points 0 points  Total Score 0 0    Immunizations Immunization History  Administered Date(s) Administered   Fluad Quad(high Dose 65+) 06/22/2019, 07/06/2020, 07/05/2021   Influenza Split 07/30/2011, 07/10/2012, 06/21/2016   Influenza Whole 08/04/2007, 07/21/2008, 07/18/2009, 07/16/2010   Influenza, High Dose Seasonal PF 07/14/2015, 07/16/2016, 07/17/2017, 07/09/2018   Influenza,inj,Quad PF,6+ Mos 07/05/2013, 08/03/2014, 07/13/2015   Influenza-Unspecified 06/22/2019, 07/06/2020, 06/21/2021   PFIZER(Purple Top)SARS-COV-2 Vaccination 11/21/2019, 12/12/2019, 10/03/2020   Pneumococcal Conjugate-13 01/09/2016   Pneumococcal Polysaccharide-23 04/01/2017   Td 10/22/1999, 12/11/2009   Tdap 07/28/2015    TDAP status: Up to date  Flu Vaccine status: Up to date  Pneumococcal vaccine status: Up to date  Covid-19 vaccine status: Completed vaccines  Qualifies for Shingles Vaccine? Yes   Zostavax completed No   Shingrix Completed?: No.    Education has been provided regarding the importance of this vaccine. Patient has been advised to call insurance company to determine out of pocket expense if they have not yet received this vaccine. Advised may also receive vaccine at local pharmacy or Health Dept. Verbalized acceptance and understanding.  Screening Tests Health Maintenance  Topic Date Due   Zoster Vaccines- Shingrix (1 of 2) Never done   COVID-19 Vaccine (4 - Booster for Pfizer series) 11/28/2020   Hepatitis C Screening  10/17/2098 (Originally 08/11/1969)   COLONOSCOPY (Pts 45-31yrs Insurance coverage will need to be confirmed)  09/05/2024   TETANUS/TDAP  07/27/2025   Pneumonia Vaccine 65+ Years old  Completed   INFLUENZA VACCINE  Completed   HPV VACCINES  Aged Out    Health Maintenance  Health Maintenance Due  Topic Date Due   Zoster Vaccines- Shingrix (1 of 2) Never done   COVID-19 Vaccine (4  - Booster for Pfizer series) 11/28/2020    Colorectal cancer screening: Type of screening: Colonoscopy. Completed 09/05/21. Repeat every 3 years   Additional Screening:  Hepatitis C Screening: does qualify;  Vision Screening: Recommended annual ophthalmology exams for early detection of glaucoma and other disorders of the eye. Is the patient up to date with their annual eye exam?  Yes  Who is the provider or what is the name of the office in which the patient attends [facility] If pt is not established with a provider, would they like to be referred to a provider to establish care? No .   Dental Screening: Recommended annual dental exams for proper oral hygiene  Community Resource Referral / Chronic Care Management: CRR required this visit?  No   CCM required this visit?  No      Plan:     I have personally reviewed and noted the following in the patients chart:   Medical and social history Use of alcohol, tobacco or illicit drugs  Current medications and supplements including opioid prescriptions. Patient is not currently taking opioid prescriptions. Functional ability and status Nutritional status Physical activity Advanced directives  List of other physicians Hospitalizations, surgeries, and ER visits in previous 12 months Vitals Screenings to include cognitive, depression, and falls Referrals and appointments  In addition, I have reviewed and discussed with patient certain preventive protocols, quality metrics, and best practice recommendations. A written personalized care plan for preventive services as well as general preventive health recommendations were provided to patient.     Willette Brace, LPN   [id number]   Nurse Notes: Pt given a blood pressure log stated his blood pressure has been in the 140's, pt has a scheduled B12 injection . Would you want him to schedule an appt for b/p check please advise

## 2021-11-15 NOTE — Patient Instructions (Addendum)
Larry Williamson , Thank you for taking time to come for your Medicare Wellness Visit. I appreciate your ongoing commitment to your health goals. Please review the following plan we discussed and let me know if I can assist you in the future.   Screening recommendations/referrals: Colonoscopy: Done 09/05/21 repeat every 3 years  Recommended yearly ophthalmology/optometry visit for glaucoma screening and checkup Recommended yearly dental visit for hygiene and checkup  Vaccinations: Influenza vaccine: Done 07/05/21 repeat every year  Pneumococcal vaccine: Up to date Tdap vaccine: Done 07/28/15 repeat every 10 years  Shingles vaccine: Shingrix discussed. Please contact your pharmacy for coverage information.    Covid-19: Completed 1/31, 2/21, & 10/03/20  Advanced directives: Please bring a copy of your health care power of attorney and living will to the office at your convenience.  Conditions/risks identified: live to be 89  Next appointment: Follow up in one year for your annual wellness visit.   Preventive Care 69 Years and Older, Male Preventive care refers to lifestyle choices and visits with your health care provider that can promote health and wellness. What does preventive care include? A yearly physical exam. This is also called an annual well check. Dental exams once or twice a year. Routine eye exams. Ask your health care provider how often you should have your eyes checked. Personal lifestyle choices, including: Daily care of your teeth and gums. Regular physical activity. Eating a healthy diet. Avoiding tobacco and drug use. Limiting alcohol use. Practicing safe sex. Taking low doses of aspirin every day. Taking vitamin and mineral supplements as recommended by your health care provider. What happens during an annual well check? The services and screenings done by your health care provider during your annual well check will depend on your age, overall health, lifestyle risk  factors, and family history of disease. Counseling  Your health care provider may ask you questions about your: Alcohol use. Tobacco use. Drug use. Emotional well-being. Home and relationship well-being. Sexual activity. Eating habits. History of falls. Memory and ability to understand (cognition). Work and work Statistician. Screening  You may have the following tests or measurements: Height, weight, and BMI. Blood pressure. Lipid and cholesterol levels. These may be checked every 5 years, or more frequently if you are over 85 years old. Skin check. Lung cancer screening. You may have this screening every year starting at age 90 if you have a 30-pack-year history of smoking and currently smoke or have quit within the past 15 years. Fecal occult blood test (FOBT) of the stool. You may have this test every year starting at age 56. Flexible sigmoidoscopy or colonoscopy. You may have a sigmoidoscopy every 5 years or a colonoscopy every 10 years starting at age 40. Prostate cancer screening. Recommendations will vary depending on your family history and other risks. Hepatitis C blood test. Hepatitis B blood test. Sexually transmitted disease (STD) testing. Diabetes screening. This is done by checking your blood sugar (glucose) after you have not eaten for a while (fasting). You may have this done every 1-3 years. Abdominal aortic aneurysm (AAA) screening. You may need this if you are a current or former smoker. Osteoporosis. You may be screened starting at age 65 if you are at high risk. Talk with your health care provider about your test results, treatment options, and if necessary, the need for more tests. Vaccines  Your health care provider may recommend certain vaccines, such as: Influenza vaccine. This is recommended every year. Tetanus, diphtheria, and acellular pertussis (Tdap, Td)  vaccine. You may need a Td booster every 10 years. Zoster vaccine. You may need this after age  59. Pneumococcal 13-valent conjugate (PCV13) vaccine. One dose is recommended after age 35. Pneumococcal polysaccharide (PPSV23) vaccine. One dose is recommended after age 18. Talk to your health care provider about which screenings and vaccines you need and how often you need them. This information is not intended to replace advice given to you by your health care provider. Make sure you discuss any questions you have with your health care provider. Document Released: 11/03/2015 Document Revised: 06/26/2016 Document Reviewed: 08/08/2015 Elsevier Interactive Patient Education  2017 [place] Prevention in the Home Falls can cause injuries. They can happen to people of all ages. There are many things you can do to make your home safe and to help prevent falls. What can I do on the outside of my home? Regularly fix the edges of walkways and driveways and fix any cracks. Remove anything that might make you trip as you walk through a door, such as a raised step or threshold. Trim any bushes or trees on the path to your home. Use bright outdoor lighting. Clear any walking paths of anything that might make someone trip, such as rocks or tools. Regularly check to see if handrails are loose or broken. Make sure that both sides of any steps have handrails. Any raised decks and porches should have guardrails on the edges. Have any leaves, snow, or ice cleared regularly. Use sand or salt on walking paths during winter. Clean up any spills in your garage right away. This includes oil or grease spills. What can I do in the bathroom? Use night lights. Install grab bars by the toilet and in the tub and shower. Do not use towel bars as grab bars. Use non-skid mats or decals in the tub or shower. If you need to sit down in the shower, use a plastic, non-slip stool. Keep the floor dry. Clean up any water that spills on the floor as soon as it happens. Remove soap buildup in the tub or shower  regularly. Attach bath mats securely with double-sided non-slip rug tape. Do not have throw rugs and other things on the floor that can make you trip. What can I do in the bedroom? Use night lights. Make sure that you have a light by your bed that is easy to reach. Do not use any sheets or blankets that are too big for your bed. They should not hang down onto the floor. Have a firm chair that has side arms. You can use this for support while you get dressed. Do not have throw rugs and other things on the floor that can make you trip. What can I do in the kitchen? Clean up any spills right away. Avoid walking on wet floors. Keep items that you use a lot in easy-to-reach places. If you need to reach something above you, use a strong step stool that has a grab bar. Keep electrical cords out of the way. Do not use floor polish or wax that makes floors slippery. If you must use wax, use non-skid floor wax. Do not have throw rugs and other things on the floor that can make you trip. What can I do with my stairs? Do not leave any items on the stairs. Make sure that there are handrails on both sides of the stairs and use them. Fix handrails that are broken or loose. Make sure that handrails are as long  as the stairways. Check any carpeting to make sure that it is firmly attached to the stairs. Fix any carpet that is loose or worn. Avoid having throw rugs at the top or bottom of the stairs. If you do have throw rugs, attach them to the floor with carpet tape. Make sure that you have a light switch at the top of the stairs and the bottom of the stairs. If you do not have them, ask someone to add them for you. What else can I do to help prevent falls? Wear shoes that: Do not have high heels. Have rubber bottoms. Are comfortable and fit you well. Are closed at the toe. Do not wear sandals. If you use a stepladder: Make sure that it is fully opened. Do not climb a closed stepladder. Make sure that  both sides of the stepladder are locked into place. Ask someone to hold it for you, if possible. Clearly mark and make sure that you can see: Any grab bars or handrails. First and last steps. Where the edge of each step is. Use tools that help you move around (mobility aids) if they are needed. These include: Canes. Walkers. Scooters. Crutches. Turn on the lights when you go into a dark area. Replace any light bulbs as soon as they burn out. Set up your furniture so you have a clear path. Avoid moving your furniture around. If any of your floors are uneven, fix them. If there are any pets around you, be aware of where they are. Review your medicines with your doctor. Some medicines can make you feel dizzy. This can increase your chance of falling. Ask your doctor what other things that you can do to help prevent falls. This information is not intended to replace advice given to you by your health care provider. Make sure you discuss any questions you have with your health care provider. Document Released: 08/03/2009 Document Revised: 03/14/2016 Document Reviewed: 11/11/2014 Elsevier Interactive Patient Education  2017 [employer].

## 2021-11-16 DIAGNOSIS — M542 Cervicalgia: Secondary | ICD-10-CM | POA: Diagnosis not present

## 2021-11-16 DIAGNOSIS — M546 Pain in thoracic spine: Secondary | ICD-10-CM | POA: Diagnosis not present

## 2021-11-16 DIAGNOSIS — M9901 Segmental and somatic dysfunction of cervical region: Secondary | ICD-10-CM | POA: Diagnosis not present

## 2021-11-16 DIAGNOSIS — M9902 Segmental and somatic dysfunction of thoracic region: Secondary | ICD-10-CM | POA: Diagnosis not present

## 2021-11-16 NOTE — Progress Notes (Signed)
Phone [phone] In person visit   Subjective:   Larry Williamson is a 71 y.o. year old very pleasant male patient who presents for/with See problem oriented charting Chief Complaint  Patient presents with   Follow-up   Hypertension    This visit occurred during the SARS-CoV-2 public health emergency.  Safety protocols were in place, including screening questions prior to the visit, additional usage of staff PPE, and extensive cleaning of exam room while observing appropriate contact time as indicated for disinfecting solutions.   Past Medical History-  Patient Active Problem List   Diagnosis Date Noted   Lower GI bleed 01/30/2021    Priority: High   AAA (abdominal aortic aneurysm) 01/18/2016    Priority: High   Myalgia due to statin 07/06/2020    Priority: Medium    B12 deficiency 12/03/2019    Priority: Medium    Hyperglycemia 10/01/2016    Priority: Medium    Meralgia paresthetica 12/28/2014    Priority: Medium    Hyperlipidemia 05/15/2007    Priority: Medium    Essential hypertension 05/15/2007    Priority: Medium    Dupuytren's contracture of right hand 10/28/2019    Priority: Low   History of skin cancer 12/28/2014    Priority: Low   Former smoker 12/28/2014    Priority: Low   Abnormal liver enzymes 12/29/2010    Priority: Low   Allergic rhinitis 06/17/2008    Priority: Low   ERECTILE DYSFUNCTION 12/07/2007    Priority: Low   History of colonic polyps 12/07/2007    Priority: Low   Umbilical hernia 04/15/9484    Priority: Low   GERD 05/15/2007    Priority: Low    Medications- reviewed and updated Current Outpatient Medications  Medication Sig Dispense Refill   aspirin 81 MG tablet Take 1 tablet (81 mg total) by mouth daily. 30 tablet    cetirizine (ZYRTEC) 10 MG tablet Take 10 mg by mouth daily.     Cyanocobalamin 2000 MCG/ML SOLN Inject 1,000 mcg as directed every 30 (thirty) days. Patient comes get his b12 injection every 3rd Thursday in the  month.     gabapentin (NEURONTIN) 300 MG capsule TAKE 1 CAPSULE(300 MG) BY MOUTH TWICE DAILY 120 capsule 1   irbesartan-hydrochlorothiazide (AVALIDE) 300-12.5 MG tablet TAKE 1 TABLET BY MOUTH DAILY 90 tablet 3   MULTIPLE VITAMIN PO Take 1 tablet by mouth daily.     Omega-3 Fatty Acids (FISH OIL) 1000 MG CAPS Take 2 capsules by mouth daily.     omeprazole (PRILOSEC) 40 MG capsule TAKE 1 CAPSULE BY MOUTH EVERY DAY 90 capsule 1   tadalafil (CIALIS) 10 MG tablet Take 5 mg by mouth daily as needed for erectile dysfunction.     tizanidine (ZANAFLEX) 2 MG capsule TAKE 1 CAPSULE(2 MG) BY MOUTH TWICE DAILY AS NEEDED FOR MUSCLE SPASMS 40 capsule 2   amLODipine (NORVASC) 5 MG tablet TAKE 1 TABLET(2.5 MG) BY MOUTH DAILY 90 tablet 3   rosuvastatin (CRESTOR) 5 MG tablet Take 1 tablet (5 mg total) by mouth 2 (two) times a week. 26 tablet 3   No current facility-administered medications for this visit.     Objective:  BP 136/76 Comment: retake in office   Pulse 71    Temp 98.1 F (36.7 C)    Ht 5\' 9"  (1.753 m)    Wt 222 lb 3.2 oz (100.8 kg)    SpO2 96%    BMI 32.81 kg/m  Gen: NAD, resting comfortably  CV: RRR no murmurs rubs or gallops Lungs: CTAB no crackles, wheeze, rhonchi Ext: no edema Skin: warm, dry    Assessment and Plan    #hypertension S: medication: well-controlled on irbesartan hydrochlorothiazide 300-12.5 mg daily  and amlodipine 2.5 mg daily at last visit - Since that time patient has noted blood pressure trending up-at wellness visit patient reported many blood pressure readings were in the 140s Home readings #s: typically 140s BP Readings from Last 3 Encounters:  11/19/21 136/76  11/15/21 (!) 147/82  10/11/21 (!) 145/78  A/P: poor control last few visits here and on home readings. Reasonable control on repeat today. . Increasing Amlodipine to 5 mg and update me in 3 weeks through MyChart with your at home blood pressure readings. Blood pressure should average <135/85.     #hyperlipidemia S: Medication:rosuvastatin 5 mg twice a week om dece,ner( use to be once a week)  - had statin myalgias in the past and unable to tolerate higher doses most likely Lab Results  Component Value Date   CHOL 158 09/28/2021   HDL 45.60 09/28/2021   LDLCALC 91 09/28/2021   LDLDIRECT 116 (H) 07/06/2020   TRIG 104.0 09/28/2021   CHOLHDL 3 09/28/2021   A/P: hopefully improving- he will schedule CPE and see me back within 6 months for bloodwork- for now continue current meds  #obesity- reports down 12 lbs on home scales- congratulated him on his efforts!   #History of abdominal aortic aneurysm-suprarenal fusiform AAA 2.9 x 3.6 cm-improved and not technically an aneurysm territory July 2018 with 2-year repeat planned-not noted September 2020 either-potentially repeat 2-3 years out.  Would still like to target blood pressure at least less than 140.  -today we opted for 1 final check- if still not noted will discontinue checks   Recommended follow up: Return in about 4 months (around 03/19/2022) for physical or sooner if needed. Future Appointments  Date Time Provider [place]  12/13/2021  2:00 PM [facility] NURSE [facility]  01/10/2022  9:00 AM [facility] NURSE [facility]  04/01/2022  3:00 PM [facility]CM PHARMACIST [facility]  07/17/2022  7:30 AM Lavonna Monarch, MD [facility]  11/22/2022  8:00 AM [facility] HEALTH COACH [facility]    Lab/Order associations:   ICD-10-CM   1. Essential hypertension  I10     2. Hyperlipidemia, unspecified hyperlipidemia type  E78.5     3. Abdominal aortic aneurysm (AAA) without rupture, unspecified part  I71.40 VAS [place] AAA DUPLEX       Meds ordered this encounter  Medications   amLODipine (NORVASC) 5 MG tablet    Sig: TAKE 1 TABLET(2.5 MG) BY MOUTH DAILY    Dispense:  90 tablet    Refill:  3   rosuvastatin (CRESTOR) 5 MG tablet    Sig: Take 1 tablet (5 mg total) by mouth 2 (two) times a week.    Dispense:  26 tablet     Refill:  3    I,Jada Bradford,acting as a scribe for Garret Reddish, MD.,have documented all relevant documentation on the behalf of Garret Reddish, MD,as directed by  Garret Reddish, MD while in the presence of Garret Reddish, MD.  I, Garret Reddish, MD, have reviewed all documentation for this visit. The documentation on 11/19/21 for the exam, diagnosis, procedures, and orders are all accurate and complete.  Return precautions advised.  Garret Reddish, MD

## 2021-11-16 NOTE — Patient Instructions (Addendum)
Team if possible go ahead and schedule patients wife for new patient appointment- she is in the car if you need more info directly from her  We will call you within two weeks about your referral to Aneurysm Follow-Up. If you do not hear within 2 weeks, give [place] a call.   Increasing Amlodipine to 5 mg and update me in 3 weeks through MyChart with your at home blood pressure readings. Blood pressure should average <135/85.   Recommended follow up: Return in about 4 months (around 03/19/2022) for physical or sooner if needed.

## 2021-11-19 ENCOUNTER — Ambulatory Visit (INDEPENDENT_AMBULATORY_CARE_PROVIDER_SITE_OTHER): Payer: Medicare Other | Admitting: Family Medicine

## 2021-11-19 ENCOUNTER — Encounter: Payer: Self-pay | Admitting: Family Medicine

## 2021-11-19 ENCOUNTER — Other Ambulatory Visit: Payer: Self-pay

## 2021-11-19 VITALS — BP 136/76 | HR 71 | Temp 98.1°F | Ht 69.0 in | Wt 222.2 lb

## 2021-11-19 DIAGNOSIS — E785 Hyperlipidemia, unspecified: Secondary | ICD-10-CM | POA: Diagnosis not present

## 2021-11-19 DIAGNOSIS — I714 Abdominal aortic aneurysm, without rupture, unspecified: Secondary | ICD-10-CM | POA: Diagnosis not present

## 2021-11-19 DIAGNOSIS — I10 Essential (primary) hypertension: Secondary | ICD-10-CM | POA: Diagnosis not present

## 2021-11-19 MED ORDER — AMLODIPINE BESYLATE 5 MG PO TABS: ORAL_TABLET | ORAL | 3 refills | Status: DC

## 2021-11-19 MED ORDER — ROSUVASTATIN CALCIUM 5 MG PO TABS: 5.0000 mg | ORAL_TABLET | ORAL | 3 refills | Status: DC

## 2021-11-27 ENCOUNTER — Ambulatory Visit (HOSPITAL_COMMUNITY)
Admission: RE | Admit: 2021-11-27 | Discharge: 2021-11-27 | Disposition: A | Discharge status: Home / Self Care | Payer: Medicare Other | Source: Ambulatory Visit | Attending: Family Medicine | Admitting: Family Medicine

## 2021-11-27 ENCOUNTER — Other Ambulatory Visit: Payer: Self-pay

## 2021-11-27 DIAGNOSIS — I714 Abdominal aortic aneurysm, without rupture, unspecified: Secondary | ICD-10-CM | POA: Insufficient documentation

## 2021-11-30 ENCOUNTER — Other Ambulatory Visit: Payer: Self-pay | Admitting: Family Medicine

## 2021-12-13 ENCOUNTER — Ambulatory Visit: Payer: Medicare Other

## 2021-12-13 ENCOUNTER — Other Ambulatory Visit: Payer: Self-pay

## 2021-12-13 ENCOUNTER — Ambulatory Visit (INDEPENDENT_AMBULATORY_CARE_PROVIDER_SITE_OTHER): Payer: Medicare Other

## 2021-12-13 DIAGNOSIS — E538 Deficiency of other specified B group vitamins: Secondary | ICD-10-CM

## 2021-12-13 MED ORDER — CYANOCOBALAMIN 1000 MCG/ML IJ SOLN
1000.0000 ug | Freq: Once | INTRAMUSCULAR | Status: AC
  Administered 2021-12-13: 1000 ug via INTRAMUSCULAR

## 2021-12-13 NOTE — Progress Notes (Addendum)
Pt here for B12 injection for Dr. Hunter. Injection given in left deltoid. Pt tolerated well.   

## 2021-12-15 ENCOUNTER — Other Ambulatory Visit: Payer: Self-pay | Admitting: Physical Medicine and Rehabilitation

## 2022-01-10 ENCOUNTER — Ambulatory Visit (INDEPENDENT_AMBULATORY_CARE_PROVIDER_SITE_OTHER): Payer: Medicare Other | Admitting: *Deleted

## 2022-01-10 DIAGNOSIS — E538 Deficiency of other specified B group vitamins: Secondary | ICD-10-CM

## 2022-01-10 MED ORDER — CYANOCOBALAMIN 1000 MCG/ML IJ SOLN
1000.0000 ug | Freq: Once | INTRAMUSCULAR | Status: AC
  Administered 2022-01-10: 1000 ug via INTRAMUSCULAR

## 2022-01-10 NOTE — Progress Notes (Signed)
I have reviewed and agree with note, evaluation, plan.   Hailea Eaglin, MD  

## 2022-01-10 NOTE — Progress Notes (Signed)
Per orders of Dr. Yong Channel, injection of monthly B 12 given in right deltoid per patient prefence by Larry Williamson. Patient tolerated injection well.  ?

## 2022-02-07 ENCOUNTER — Ambulatory Visit (INDEPENDENT_AMBULATORY_CARE_PROVIDER_SITE_OTHER): Payer: Medicare Other | Admitting: *Deleted

## 2022-02-07 DIAGNOSIS — E538 Deficiency of other specified B group vitamins: Secondary | ICD-10-CM

## 2022-02-07 MED ORDER — CYANOCOBALAMIN 1000 MCG/ML IJ SOLN
1000.0000 ug | Freq: Once | INTRAMUSCULAR | Status: AC
  Administered 2022-02-07: 1000 ug via INTRAMUSCULAR

## 2022-02-07 NOTE — Progress Notes (Signed)
Per orders of Dr. Yong Channel, injection of B 12 given in left deltoid per patient preference by Zacarias Pontes. Patient tolerated injection well.  ?

## 2022-03-07 ENCOUNTER — Ambulatory Visit: Payer: Medicare Other

## 2022-03-14 ENCOUNTER — Ambulatory Visit (INDEPENDENT_AMBULATORY_CARE_PROVIDER_SITE_OTHER): Payer: Medicare Other

## 2022-03-14 DIAGNOSIS — E538 Deficiency of other specified B group vitamins: Secondary | ICD-10-CM

## 2022-03-14 MED ORDER — CYANOCOBALAMIN 1000 MCG/ML IJ SOLN
1000.0000 ug | Freq: Once | INTRAMUSCULAR | Status: AC
  Administered 2022-03-14: 1000 ug via INTRAMUSCULAR

## 2022-03-14 NOTE — Progress Notes (Cosign Needed Addendum)
Pt came in for B-12 injection and was administered IM injection in Left Deltoid. Pt tolerated injection well.

## 2022-03-21 ENCOUNTER — Telehealth: Payer: Self-pay | Admitting: Family Medicine

## 2022-03-21 NOTE — Telephone Encounter (Signed)
Pt's wife states pt is out of town and is having motion sickness. He is asking for something to be ordered.   If it can be, please send to:  [facility], [place], [place] [address] Phone [phone]

## 2022-03-21 NOTE — Telephone Encounter (Signed)
See below

## 2022-03-21 NOTE — Telephone Encounter (Signed)
Is this related to being on a boat? There are patches that can be used that I can send in. Dramamine is also available over the counter  Is it related to vertigo/room spinning? If so probably best for him to be evaluated.   Could also offer virtual if his insurance still covers that

## 2022-03-22 NOTE — Telephone Encounter (Signed)
Called and spoke with pt and message given. 

## 2022-03-25 DIAGNOSIS — H903 Sensorineural hearing loss, bilateral: Secondary | ICD-10-CM | POA: Diagnosis not present

## 2022-03-25 DIAGNOSIS — H6123 Impacted cerumen, bilateral: Secondary | ICD-10-CM | POA: Diagnosis not present

## 2022-04-01 ENCOUNTER — Telehealth: Payer: Medicare Other

## 2022-04-11 ENCOUNTER — Ambulatory Visit: Payer: Medicare Other

## 2022-04-14 ENCOUNTER — Other Ambulatory Visit: Payer: Self-pay | Admitting: Physical Medicine and Rehabilitation

## 2022-04-15 ENCOUNTER — Telehealth: Payer: Self-pay | Admitting: Family Medicine

## 2022-04-15 NOTE — Telephone Encounter (Signed)
FYI

## 2022-04-17 NOTE — Telephone Encounter (Signed)
Mychart message sent.

## 2022-05-02 ENCOUNTER — Ambulatory Visit (INDEPENDENT_AMBULATORY_CARE_PROVIDER_SITE_OTHER): Payer: Medicare Other

## 2022-05-02 DIAGNOSIS — E538 Deficiency of other specified B group vitamins: Secondary | ICD-10-CM | POA: Diagnosis not present

## 2022-05-02 MED ORDER — CYANOCOBALAMIN 1000 MCG/ML IJ SOLN
1000.0000 ug | Freq: Once | INTRAMUSCULAR | Status: AC
  Administered 2022-05-02: 1000 ug via INTRAMUSCULAR

## 2022-05-02 NOTE — Progress Notes (Signed)
Pt tolerated b12 well. 

## 2022-05-09 ENCOUNTER — Ambulatory Visit: Payer: Medicare Other

## 2022-05-22 ENCOUNTER — Encounter: Payer: Self-pay | Admitting: Family Medicine

## 2022-05-22 ENCOUNTER — Ambulatory Visit (INDEPENDENT_AMBULATORY_CARE_PROVIDER_SITE_OTHER): Payer: Medicare Other | Admitting: Family Medicine

## 2022-05-22 VITALS — BP 130/66 | HR 65 | Temp 98.4°F | Ht 69.0 in | Wt 222.6 lb

## 2022-05-22 DIAGNOSIS — R0609 Other forms of dyspnea: Secondary | ICD-10-CM | POA: Diagnosis not present

## 2022-05-22 DIAGNOSIS — Z0000 Encounter for general adult medical examination without abnormal findings: Secondary | ICD-10-CM

## 2022-05-22 DIAGNOSIS — Z87891 Personal history of nicotine dependence: Secondary | ICD-10-CM | POA: Diagnosis not present

## 2022-05-22 DIAGNOSIS — E785 Hyperlipidemia, unspecified: Secondary | ICD-10-CM

## 2022-05-22 DIAGNOSIS — R739 Hyperglycemia, unspecified: Secondary | ICD-10-CM

## 2022-05-22 DIAGNOSIS — E538 Deficiency of other specified B group vitamins: Secondary | ICD-10-CM | POA: Diagnosis not present

## 2022-05-22 DIAGNOSIS — Z1283 Encounter for screening for malignant neoplasm of skin: Secondary | ICD-10-CM

## 2022-05-22 LAB — COMPREHENSIVE METABOLIC PANEL
ALT: 25 U/L (ref 0–53)
AST: 22 U/L (ref 0–37)
Albumin: 4.4 g/dL (ref 3.5–5.2)
Alkaline Phosphatase: 40 U/L (ref 39–117)
BUN: 14 mg/dL (ref 6–23)
CO2: 24 mEq/L (ref 19–32)
Calcium: 9.2 mg/dL (ref 8.4–10.5)
Chloride: 101 mEq/L (ref 96–112)
Creatinine, Ser: 1.14 mg/dL (ref 0.40–1.50)
GFR: 64.58 mL/min (ref >60.00)
Glucose, Bld: 87 mg/dL (ref 70–99)
Potassium: 4.2 mEq/L (ref 3.5–5.1)
Sodium: 138 mEq/L (ref 135–145)
Total Bilirubin: 0.8 mg/dL (ref 0.2–1.2)
Total Protein: 7.2 g/dL (ref 6.0–8.3)

## 2022-05-22 LAB — HEMOGLOBIN A1C: Hgb A1c MFr Bld: 5.4 % (ref 4.6–6.5)

## 2022-05-22 LAB — CBC WITH DIFFERENTIAL/PLATELET
Basophils Absolute: 0.1 10*3/uL (ref 0.0–0.1)
Basophils Relative: 1 % (ref 0.0–3.0)
Eosinophils Absolute: 0.4 10*3/uL (ref 0.0–0.7)
Eosinophils Relative: 5.5 % — ABNORMAL HIGH (ref 0.0–5.0)
HCT: 41.4 % (ref 39.0–52.0)
Hemoglobin: 14.1 g/dL (ref 13.0–17.0)
Lymphocytes Relative: 35.5 % (ref 12.0–46.0)
Lymphs Abs: 2.5 10*3/uL (ref 0.7–4.0)
MCHC: 34.1 g/dL (ref 30.0–36.0)
MCV: 88.7 fl (ref 78.0–100.0)
Monocytes Absolute: 0.6 10*3/uL (ref 0.1–1.0)
Monocytes Relative: 8.8 % (ref 3.0–12.0)
Neutro Abs: 3.5 10*3/uL (ref 1.4–7.7)
Neutrophils Relative %: 49.2 % (ref 43.0–77.0)
Platelets: 239 10*3/uL (ref 150.0–400.0)
RBC: 4.67 Mil/uL (ref 4.22–5.81)
RDW: 13.9 % (ref 11.5–15.5)
WBC: 7.1 10*3/uL (ref 4.0–10.5)

## 2022-05-22 LAB — VITAMIN B12: Vitamin B-12: 358 pg/mL (ref 211–911)

## 2022-05-22 LAB — LIPID PANEL
Cholesterol: 184 mg/dL (ref 0–200)
HDL: 48.6 mg/dL (ref >39.00)
NonHDL: 135.1
Total CHOL/HDL Ratio: 4
Triglycerides: 214 mg/dL — ABNORMAL HIGH (ref 0.0–149.0)
VLDL: 42.8 mg/dL — ABNORMAL HIGH (ref 0.0–40.0)

## 2022-05-22 LAB — LDL CHOLESTEROL, DIRECT: Direct LDL: 109 mg/dL

## 2022-05-22 NOTE — Progress Notes (Signed)
Phone [phone] In person visit   Subjective:   Larry Williamson is a 71 y.o. year old very pleasant male patient who presents for/with See problem oriented charting  Past Medical History-  Patient Active Problem List   Diagnosis Date Noted   Lower GI bleed 01/30/2021    Priority: High   AAA (abdominal aortic aneurysm) ([place]) 01/18/2016    Priority: High   Myalgia due to statin 07/06/2020    Priority: Medium    B12 deficiency 12/03/2019    Priority: Medium    Hyperglycemia 10/01/2016    Priority: Medium    Meralgia paresthetica 12/28/2014    Priority: Medium    Hyperlipidemia 05/15/2007    Priority: Medium    Essential hypertension 05/15/2007    Priority: Medium    Dupuytren's contracture of right hand 10/28/2019    Priority: Low   History of skin cancer 12/28/2014    Priority: Low   Former smoker 12/28/2014    Priority: Low   Abnormal liver enzymes 12/29/2010    Priority: Low   Allergic rhinitis 06/17/2008    Priority: Low   ERECTILE DYSFUNCTION 12/07/2007    Priority: Low   History of colonic polyps 12/07/2007    Priority: Low   Umbilical hernia 61/60/7371    Priority: Low   GERD 05/15/2007    Priority: Low   Dyspnea on exertion 05/22/2022    Medications- reviewed and updated Current Outpatient Medications  Medication Sig Dispense Refill   amLODipine (NORVASC) 5 MG tablet TAKE 1 TABLET(2.5 MG) BY MOUTH DAILY 90 tablet 3   cetirizine (ZYRTEC) 10 MG tablet Take 10 mg by mouth daily.     Cyanocobalamin 2000 MCG/ML SOLN Inject 1,000 mcg as directed every 30 (thirty) days. Patient comes get his b12 injection every 3rd Thursday in the month.     gabapentin (NEURONTIN) 300 MG capsule TAKE 1 CAPSULE(300 MG) BY MOUTH TWICE DAILY 120 capsule 1   irbesartan-hydrochlorothiazide (AVALIDE) 300-12.5 MG tablet TAKE 1 TABLET BY MOUTH DAILY 90 tablet 3   MULTIPLE VITAMIN PO Take 1 tablet by mouth daily.     Omega-3 Fatty Acids (FISH OIL) 1000 MG CAPS Take 2 capsules by  mouth daily.     omeprazole (PRILOSEC) 40 MG capsule TAKE 1 CAPSULE BY MOUTH EVERY DAY 90 capsule 1   rosuvastatin (CRESTOR) 5 MG tablet Take 1 tablet (5 mg total) by mouth 2 (two) times a week. 26 tablet 3   tadalafil (CIALIS) 10 MG tablet Take 5 mg by mouth daily as needed for erectile dysfunction.     tizanidine (ZANAFLEX) 2 MG capsule TAKE 1 CAPSULE(2 MG) BY MOUTH TWICE DAILY AS NEEDED FOR MUSCLE SPASMS 40 capsule 2   No current facility-administered medications for this visit.     Objective:  BP 130/66   Pulse 65   Temp 98.4 F (36.9 C)   Ht '5\' 9"'$  (1.753 m)   Wt 222 lb 9.6 oz (101 kg)   SpO2 98%   BMI 32.87 kg/m  Gen: NAD, resting comfortably  EKG: sinus rhythm with rate 6464, normal axis, normal intervals, no hypertrophy, no st or t wave changes. Stable from 10/28/19 EKG with exception AvL flatter than 2021 but no contiguous lead changed     Assessment and Plan    # dyspnea on exertion S:feels like he gives out of breath relatively quickly over last year. No chest pain with this. Mild cough 6-8 months. No chest pain or palpitations or edema. No history of blood  clots. No calf pain or edema. No wheeze A/P: New onset DOE not previously reported and associated with mild cough -his main concern was to be evaluated for potential COPD- he quit smoking in the 1980s though and I think this is less likely.   -with chronic cough get CXR and to evaluate for  mass or walking pneumonia -updated EKG largely reassuring - will get echocardiogram -if above workup reassuring- consider pulmonology consult as that is his chief concern for potential PFTs though may later need cardiac consult if pulmonology thinks workup reassuring  Orders: -     DG Chest 2 View; Future -     ECHOCARDIOGRAM COMPLETE; Future -     EKG 12-Lead  Recommended follow up: as needed for dyspnea concerns- gave ED precautions as well Future Appointments  Date Time Provider [place]  06/06/2022  9:00 AM  [facility] NURSE [facility]  07/11/2022  9:00 AM [facility] NURSE [facility]  07/17/2022  7:30 AM Lavonna Monarch, MD [facility]  08/08/2022  9:00 AM [facility] NURSE [facility]  09/05/2022  9:00 AM [facility] NURSE [facility]  10/03/2022  9:00 AM [facility] NURSE [facility]  10/08/2022  2:00 PM [facility] CCM PHARMACIST [facility]  11/22/2022  8:00 AM [facility] HEALTH COACH [facility]    Lab/Order associations:   ICD-10-CM   1. Dyspnea on exertion  R06.09 DG Chest 2 View    ECHOCARDIOGRAM COMPLETE    EKG 12-Lead   Return precautions advised.  Garret Reddish, MD

## 2022-05-22 NOTE — Assessment & Plan Note (Signed)
#   dyspnea on exertion S:feels like he gives out of breath relatively quickly over last year. No chest pain with this. Mild cough 6-8 months. No chest pain or palpitations or edema. No history of blood clots. No calf pain or edema. No wheeze A/P: New onset DOE not previously reported and associated with mild cough -his main concern was to be evaluated for potential COPD- he quit smoking in the 1980s though and I think this is less likely.   -with chronic cough get CXR and to evaluate for  mass or walking pneumonia -updated EKG largely reassuring - will get echocardiogram -if above workup reassuring- consider pulmonology consult as that is his chief concern for potential PFTs though may later need cardiac consult if pulmonology thinks workup reassuring

## 2022-05-22 NOTE — Patient Instructions (Addendum)
Health Maintenance Due  Topic Date Due   Zoster Vaccines- Shingrix (1 of 2)  Team please call [facility] on [address] for dates of his shingrix Never done   INFLUENZA VACCINE  Flu shot- we should have these available within a month or two but please let us know if you get at outside pharmacy  05/21/2022   EKG today- then he is free to go- will mychart results  We will call you within two weeks about your referral to echocardiogram. If you do not hear within 2 weeks, give [place] a call.   Please go to [facility] (updated 12/16/2019) - located [address] across the street from [place] - in the basement - Hours: 8:30-5:00 PM M-F (with lunch from 12:30- 1 PM). You do NOT need an appointment.    Recommended follow up: Return in about 6 months (around 11/22/2022) for followup or sooner if needed.Schedule b4 you leave.
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
## 2022-06-06 ENCOUNTER — Ambulatory Visit: Payer: Medicare Other

## 2022-06-06 ENCOUNTER — Ambulatory Visit (INDEPENDENT_AMBULATORY_CARE_PROVIDER_SITE_OTHER): Payer: Medicare Other | Admitting: *Deleted

## 2022-06-06 DIAGNOSIS — E538 Deficiency of other specified B group vitamins: Secondary | ICD-10-CM | POA: Diagnosis not present

## 2022-06-06 MED ORDER — CYANOCOBALAMIN 1000 MCG/ML IJ SOLN
1000.0000 ug | Freq: Once | INTRAMUSCULAR | Status: AC
  Administered 2022-06-06: 1000 ug via INTRAMUSCULAR

## 2022-06-06 NOTE — Progress Notes (Signed)
Patient presents for B12 injection today. Patient received her B12 injection in Left Deltoid. Patient tolerated injection well.  Documentation entered in [facility] in [place].

## 2022-06-09 ENCOUNTER — Other Ambulatory Visit: Payer: Self-pay | Admitting: Family Medicine

## 2022-06-10 ENCOUNTER — Ambulatory Visit (INDEPENDENT_AMBULATORY_CARE_PROVIDER_SITE_OTHER)
Admission: RE | Admit: 2022-06-10 | Discharge: 2022-06-10 | Disposition: A | Discharge status: Home / Self Care | Payer: Medicare Other | Source: Ambulatory Visit | Attending: Family Medicine | Admitting: Family Medicine

## 2022-06-10 ENCOUNTER — Ambulatory Visit (HOSPITAL_COMMUNITY): Payer: Medicare Other | Attending: Cardiovascular Disease

## 2022-06-10 DIAGNOSIS — R0609 Other forms of dyspnea: Secondary | ICD-10-CM | POA: Insufficient documentation

## 2022-06-10 DIAGNOSIS — R0602 Shortness of breath: Secondary | ICD-10-CM | POA: Diagnosis not present

## 2022-06-10 LAB — ECHOCARDIOGRAM COMPLETE
Area-P 1/2: 3.23 cm2
S' Lateral: 2.3 cm

## 2022-06-12 ENCOUNTER — Other Ambulatory Visit: Payer: Self-pay | Admitting: Family Medicine

## 2022-06-12 DIAGNOSIS — R0609 Other forms of dyspnea: Secondary | ICD-10-CM

## 2022-06-12 DIAGNOSIS — R918 Other nonspecific abnormal finding of lung field: Secondary | ICD-10-CM

## 2022-06-12 MED ORDER — AZITHROMYCIN 250 MG PO TABS: ORAL_TABLET | ORAL | 0 refills | Status: AC

## 2022-06-26 DIAGNOSIS — L570 Actinic keratosis: Secondary | ICD-10-CM | POA: Diagnosis not present

## 2022-06-26 DIAGNOSIS — D1801 Hemangioma of skin and subcutaneous tissue: Secondary | ICD-10-CM | POA: Diagnosis not present

## 2022-06-26 DIAGNOSIS — L814 Other melanin hyperpigmentation: Secondary | ICD-10-CM | POA: Diagnosis not present

## 2022-06-26 DIAGNOSIS — L578 Other skin changes due to chronic exposure to nonionizing radiation: Secondary | ICD-10-CM | POA: Diagnosis not present

## 2022-06-26 DIAGNOSIS — Z85828 Personal history of other malignant neoplasm of skin: Secondary | ICD-10-CM | POA: Diagnosis not present

## 2022-06-26 DIAGNOSIS — L821 Other seborrheic keratosis: Secondary | ICD-10-CM | POA: Diagnosis not present

## 2022-07-08 ENCOUNTER — Telehealth: Payer: Self-pay | Admitting: Pharmacist
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
## 2022-07-09 ENCOUNTER — Ambulatory Visit (INDEPENDENT_AMBULATORY_CARE_PROVIDER_SITE_OTHER): Payer: Medicare Other

## 2022-07-09 DIAGNOSIS — E538 Deficiency of other specified B group vitamins: Secondary | ICD-10-CM | POA: Diagnosis not present

## 2022-07-09 MED ORDER — CYANOCOBALAMIN 1000 MCG/ML IJ SOLN
1000.0000 ug | Freq: Once | INTRAMUSCULAR | Status: AC
  Administered 2022-07-09: 1000 ug via INTRAMUSCULAR

## 2022-07-09 NOTE — Progress Notes (Signed)
Patient was given the b12 injection today per Dr. Yong Channel. Patient tolerated injection well. Joanette Gula, CMA

## 2022-07-10 ENCOUNTER — Ambulatory Visit: Payer: Medicare Other | Admitting: Pulmonary Disease

## 2022-07-10 ENCOUNTER — Encounter: Payer: Self-pay | Admitting: Pulmonary Disease

## 2022-07-10 VITALS — BP 144/64 | HR 75 | Temp 98.2°F | Ht 69.0 in | Wt 224.2 lb

## 2022-07-10 DIAGNOSIS — R9389 Abnormal findings on diagnostic imaging of other specified body structures: Secondary | ICD-10-CM

## 2022-07-10 DIAGNOSIS — K2100 Gastro-esophageal reflux disease with esophagitis, without bleeding: Secondary | ICD-10-CM | POA: Diagnosis not present

## 2022-07-10 DIAGNOSIS — R0609 Other forms of dyspnea: Secondary | ICD-10-CM

## 2022-07-10 NOTE — Progress Notes (Signed)
$'@Patient'P$  ID: Larry Williamson, male    DOB: Dec 01, 1950, 71 y.o.   MRN: [id number]  Chief complaint: Dyspnea on exertion  Referring provider: Marin Olp, MD  HPI:   71 y.o. man whom are seen in consultation for evaluation of dyspnea on exertion.  Most recent PCP note reviewed.  Patient reports onset of shortness of breath over the last year or so.  Okay at rest but with ongoing exertion gets short winded, fatigued.  Stable over the last year or so.  Not really worsening.  But certainly affects his ability to do things he enjoys day-to-day.  No time of day when things are better or worse.  No position make things better or worse.  No seasonal environmental factors she can identify that make things better or worse.  No other relieving or exacerbating factors.  He reports a history of of constant allergy-like symptoms.  Watery eyes, runny nose, nasal/sinus congestion.  Takes daily antihistamine.  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  Most recent chest imaging chest x-ray/2023 personally reviewed and interpreted as scattered prominent interstitial marking/thickening with nodularity, otherwise clear.  Most recent TTE 05/2022 personally reviewed which demonstrates no significant abnormalities.  PMH: Tobacco abuse in remission, hypertension, GERD, hyperlipidemia Surgical history: Presents with extraction, multiple colonoscopy with polyp removal Family history: Father lung cancer, mother with dementia Social history: Former smoker, quit 1982, 15-pack-year, lives in [facility], worked in Architect all his life, some asbestos exposure   Licensed conveyancer / Pulmonary Flowsheets:   ACT:      No data to display          MMRC:     No data to display          Epworth:      No data to display          Tests:   FENO:  No results found for: "NITRICOXIDE"  PFT:     No data to  display          WALK:      No data to display          Imaging: Personally reviewed and as per EMR discussion this note DG Chest 2 View  Result Date: 06/11/2022 CLINICAL DATA:  Shortness of breath for 1 year. EXAM: CHEST - 2 VIEW COMPARISON:  Chest radiograph December 20, 2010 FINDINGS: Cardiomegaly. Aortic atherosclerosis. Coarse bilateral interstitial pulmonary opacities bilaterally. No pleural effusion or pneumothorax. Thoracic spine degenerative changes. IMPRESSION: Coarse bilateral interstitial pulmonary opacities may represent underlying edema or atypical infection in the more acute setting. Alternatively, these may represent chronic process in the setting of chronic interstitial lung disease. Electronically Signed   By: Lovey Newcomer M.D.   On: 06/11/2022 11:55   ECHOCARDIOGRAM COMPLETE  Result Date: 06/10/2022    ECHOCARDIOGRAM REPORT   Patient Name:   Larry Williamson Date of Exam: 06/10/2022 Medical Rec #:  [id number]            Height:       69.0 in Accession #:    [id number]           Weight:       222.6 lb Date of Birth:  1951/06/23           BSA:          2.162 m Patient Age:    71 years  BP:           130/66 mmHg Patient Gender: M                    HR:           72 bpm. Exam Location:  [address] Procedure: 2D Echo, Cardiac Doppler and Color Doppler Indications:    R06.00 Dyspnea  History:        Patient has no prior history of Echocardiogram examinations.                 Risk Factors:Hypertension, Dyslipidemia and Former Smoker.                 Dyspnea on exertion. Hyperglycemia. AAA.  Sonographer:    Diamond Nickel RCS Referring Phys: [place]  1. Left ventricular ejection fraction, by estimation, is 65 to 70%. The left ventricle has normal function. The left ventricle has no regional wall motion abnormalities. There is mild concentric left ventricular hypertrophy. Left ventricular diastolic parameters were normal.  2. Right ventricular systolic  function is normal. The right ventricular size is normal. Tricuspid regurgitation signal is inadequate for assessing PA pressure.  3. The mitral valve is grossly normal. No evidence of mitral valve regurgitation. No evidence of mitral stenosis.  4. The aortic valve is grossly normal. Aortic valve regurgitation is not visualized. No aortic stenosis is present.  5. The inferior vena cava is normal in size with greater than 50% respiratory variability, suggesting right atrial pressure of 3 mmHg. FINDINGS  Left Ventricle: Left ventricular ejection fraction, by estimation, is 65 to 70%. The left ventricle has normal function. The left ventricle has no regional wall motion abnormalities. The left ventricular internal cavity size was normal in size. There is  mild concentric left ventricular hypertrophy. Left ventricular diastolic parameters were normal. Right Ventricle: The right ventricular size is normal. No increase in right ventricular wall thickness. Right ventricular systolic function is normal. Tricuspid regurgitation signal is inadequate for assessing PA pressure. Left Atrium: Left atrial size was normal in size. Right Atrium: Right atrial size was normal in size. Pericardium: Trivial pericardial effusion is present. Presence of epicardial fat layer. Mitral Valve: The mitral valve is grossly normal. No evidence of mitral valve regurgitation. No evidence of mitral valve stenosis. Tricuspid Valve: The tricuspid valve is grossly normal. Tricuspid valve regurgitation is not demonstrated. No evidence of tricuspid stenosis. Aortic Valve: The aortic valve is grossly normal. Aortic valve regurgitation is not visualized. No aortic stenosis is present. Pulmonic Valve: The pulmonic valve was grossly normal. Pulmonic valve regurgitation is not visualized. No evidence of pulmonic stenosis. Aorta: The aortic root and ascending aorta are structurally normal, with no evidence of dilitation. Venous: The inferior vena cava is  normal in size with greater than 50% respiratory variability, suggesting right atrial pressure of 3 mmHg. IAS/Shunts: The atrial septum is grossly normal.  LEFT VENTRICLE PLAX 2D LVIDd:         4.10 cm   Diastology LVIDs:         2.30 cm   LV e' medial:    7.07 cm/s LV PW:         1.20 cm   LV E/e' medial:  17.1 LV IVS:        1.20 cm   LV e' lateral:   8.27 cm/s LVOT diam:     2.00 cm   LV E/e' lateral: 14.6 LV SV:  75 LV SV Index:   35 LVOT Area:     3.14 cm  RIGHT VENTRICLE RV Basal diam:  3.60 cm RV S prime:     17.20 cm/s TAPSE (M-mode): 3.2 cm LEFT ATRIUM             Index        RIGHT ATRIUM           Index LA diam:        4.60 cm 2.13 cm/m   RA Area:     12.10 cm LA Vol (A2C):   33.5 ml 15.49 ml/m  RA Volume:   24.70 ml  11.42 ml/m LA Vol (A4C):   56.8 ml 26.27 ml/m LA Biplane Vol: 44.6 ml 20.63 ml/m  AORTIC VALVE LVOT Vmax:   130.00 cm/s LVOT Vmean:  76.300 cm/s LVOT VTI:    0.238 m  AORTA Ao Root diam: 3.20 cm Ao Asc diam:  3.30 cm MITRAL VALVE MV Area (PHT): 3.23 cm     SHUNTS MV Decel Time: 235 msec     Systemic VTI:  0.24 m MV E velocity: 121.00 cm/s  Systemic Diam: 2.00 cm MV A velocity: 115.00 cm/s MV E/A ratio:  1.05 Eleonore Chiquito MD Electronically signed by Eleonore Chiquito MD Signature Date/Time: 06/10/2022/4:13:28 PM    Final     Lab Results: Personally reviewed, eosinophils 400 CBC    Component Value Date/Time   WBC 7.1 05/22/2022 1209   RBC 4.67 05/22/2022 1209   HGB 14.1 05/22/2022 1209   HCT 41.4 05/22/2022 1209   PLT 239.0 05/22/2022 1209   MCV 88.7 05/22/2022 1209   MCH 31.1 01/31/2021 1734   MCHC 34.1 05/22/2022 1209   RDW 13.9 05/22/2022 1209   LYMPHSABS 2.5 05/22/2022 1209   MONOABS 0.6 05/22/2022 1209   EOSABS 0.4 05/22/2022 1209   BASOSABS 0.1 05/22/2022 1209    BMET    Component Value Date/Time   NA 138 05/22/2022 1209   K 4.2 05/22/2022 1209   CL 101 05/22/2022 1209   CO2 24 05/22/2022 1209   GLUCOSE 87 05/22/2022 1209   BUN 14 05/22/2022 1209    CREATININE 1.14 05/22/2022 1209   CREATININE 1.02 07/06/2020 0849   CALCIUM 9.2 05/22/2022 1209   CALCIUM 9.3 03/09/2008 2243   GFRNONAA >60 01/31/2021 0050   GFRNONAA 75 07/06/2020 0849   GFRAA 87 07/06/2020 0849    BNP No results found for: "BNP"  ProBNP No results found for: "PROBNP"  Specialty Problems       Pulmonary Problems   Allergic rhinitis    zyrtec       Dyspnea on exertion    Allergies  Allergen Reactions   Atorvastatin     Other reaction(s): Cramp    Immunization History  Administered Date(s) Administered   Fluad Quad(high Dose 65+) 06/22/2019, 07/06/2020, 07/05/2021   Influenza Split 07/30/2011, 07/10/2012, 06/21/2016   Influenza Whole 08/04/2007, 07/21/2008, 07/18/2009, 07/16/2010   Influenza, High Dose Seasonal PF 07/14/2015, 07/16/2016, 07/17/2017, 07/21/2017, 07/09/2018   Influenza,inj,Quad PF,6+ Mos 07/05/2013, 08/03/2014, 07/13/2015   Influenza-Unspecified 06/22/2019, 07/06/2020, 06/21/2021   PFIZER(Purple Top)SARS-COV-2 Vaccination 11/21/2019, 12/12/2019, 10/03/2020   Pneumococcal Conjugate-13 01/09/2016   Pneumococcal Polysaccharide-23 04/01/2017   Td 10/22/1999, 12/11/2009   Tdap 07/28/2015   Zoster Recombinat (Shingrix) 02/26/2020, 04/26/2020    Past Medical History:  Diagnosis Date   Adenomatous colon polyp 06/2006   ALLERGIC RHINITIS    Allergy    seasonal allergies   Anemia    hx  of   ANEMIA DUE TO DIETARY IRON DEFICIENCY 05/28/2007   Due to giving regularly giving blood. Resolved with cutting in half.      COMPRESSION FRACTURE, THORACIC VERTEBRA 03/09/2008   2009, no chronic pain    Diverticulitis of colon    DIVERTICULOSIS, COLON 05/28/2007   Qualifier: Diagnosis of  By: Arnoldo Morale MD, Balinda Quails    GERD (gastroesophageal reflux disease)    on meds   Hyperlipidemia    on meds   Hypertension    on meds   Melanoma ([place]) 02/23/2014   MM in situe, early, low mid back, exc   Squamous cell carcinoma in situ 11/10/2018   Top of  right shoulder, CX3, 5FU   Squamous cell carcinoma of skin    SCC IN SITU TOP OF RIGHT SHOULDER TX CX3 5FU   Ulcer 1992   gastric    Tobacco History: Social History   Tobacco Use  Smoking Status Former   Packs/day: 1.00   Years: 15.00   Total pack years: 15.00   Types: Cigarettes   Quit date: 10/21/1978   Years since quitting: 43.7  Smokeless Tobacco Never   Counseling given: Not Answered   Continue to not smoke  Outpatient Encounter Medications as of 07/10/2022  Medication Sig   amLODipine (NORVASC) 5 MG tablet TAKE 1 TABLET(2.5 MG) BY MOUTH DAILY   cetirizine (ZYRTEC) 10 MG tablet Take 10 mg by mouth daily.   Cyanocobalamin 2000 MCG/ML SOLN Inject 1,000 mcg as directed every 30 (thirty) days. Patient comes get his b12 injection every 3rd Thursday in the month.   gabapentin (NEURONTIN) 300 MG capsule TAKE 1 CAPSULE(300 MG) BY MOUTH TWICE DAILY   irbesartan-hydrochlorothiazide (AVALIDE) 300-12.5 MG tablet TAKE 1 TABLET BY MOUTH DAILY   MULTIPLE VITAMIN PO Take 1 tablet by mouth daily.   Omega-3 Fatty Acids (FISH OIL) 1000 MG CAPS Take 2 capsules by mouth daily.   omeprazole (PRILOSEC) 40 MG capsule TAKE 1 CAPSULE BY MOUTH EVERY DAY   rosuvastatin (CRESTOR) 5 MG tablet Take 1 tablet (5 mg total) by mouth 2 (two) times a week.   tadalafil (CIALIS) 10 MG tablet Take 5 mg by mouth daily as needed for erectile dysfunction.   tizanidine (ZANAFLEX) 2 MG capsule TAKE 1 CAPSULE(2 MG) BY MOUTH TWICE DAILY AS NEEDED FOR MUSCLE SPASMS   No facility-administered encounter medications on file as of 07/10/2022.     Review of Systems  Review of Systems  No chest pain with exertion.  No orthopnea or PND.  Comprehensive review of systems otherwise negative. Physical Exam  BP (!) 144/64 (BP Location: Left Arm, Patient Position: Sitting, Cuff Size: Normal)   Pulse 75   Temp 98.2 F (36.8 C) (Oral)   Ht '5\' 9"'$  (1.753 m)   Wt 224 lb 3.2 oz (101.7 kg)   SpO2 97%   BMI 33.11 kg/m   Wt  Readings from Last 5 Encounters:  07/10/22 224 lb 3.2 oz (101.7 kg)  05/22/22 222 lb 9.6 oz (101 kg)  11/19/21 222 lb 3.2 oz (100.8 kg)  11/15/21 222 lb (100.7 kg)  10/11/21 222 lb (100.7 kg)    BMI Readings from Last 5 Encounters:  07/10/22 33.11 kg/m  05/22/22 32.87 kg/m  11/19/21 32.81 kg/m  11/15/21 32.78 kg/m  10/11/21 32.78 kg/m     Physical Exam General: Well-appearing, sitting in chair Eyes: EOMI, icterus Neck: Supple, no JVP Pulmonary: Clear, normal work of breathing, no crackles Abdomen: Nondistended, bowel sounds present Cardiovascular: Warm,  no edema, regular rate and rhythm, MSK: No synovitis, no joint effusion Neuro: Normal gait, no weakness Psych: Normal mood, full affect   Assessment & Plan:   Dyspnea on exertion: Recent TTE reassuring without significant abnormality.  Chest imaging concerning for possible ILD.  PFTs for further evaluation.  Lung exam is clear which is encouraging.  Consider empiric inhaler therapy in the future if work-up is normal.  Interstitial prominence with nodularity on chest x-ray: We will obtain high-res CT chest for further evaluation.  PFTs as above.  Burning sensation in chest: Sounds like esophagitis or ongoing reflux despite PPI therapy.  Referral to GI clinic.  Chest imaging as above.   Return in about 4 weeks (around 08/07/2022).   Lanier Clam, MD 07/10/2022

## 2022-07-10 NOTE — Patient Instructions (Signed)
Nice to meet you  We will get pulmonary function tests when you return for follow up and we can go over results at that time  I ordered a special high resolution CT scan of the chest to better evaluate causes of your symptoms and the evaluate some of the changes sen chest xray in August 2023.  Return in 4 weeks with PFT same day prior to visit with Dr. Silas Flood

## 2022-07-11 ENCOUNTER — Ambulatory Visit: Payer: Medicare Other

## 2022-07-15 ENCOUNTER — Encounter: Payer: Self-pay | Admitting: *Deleted

## 2022-07-17 ENCOUNTER — Ambulatory Visit: Payer: Medicare Other | Admitting: Dermatology

## 2022-07-18 ENCOUNTER — Ambulatory Visit (HOSPITAL_BASED_OUTPATIENT_CLINIC_OR_DEPARTMENT_OTHER): Payer: Medicare Other

## 2022-07-21 ENCOUNTER — Other Ambulatory Visit: Payer: Self-pay | Admitting: Family Medicine

## 2022-07-24 ENCOUNTER — Ambulatory Visit (HOSPITAL_BASED_OUTPATIENT_CLINIC_OR_DEPARTMENT_OTHER)
Admission: RE | Admit: 2022-07-24 | Discharge: 2022-07-24 | Disposition: A | Discharge status: Home / Self Care | Payer: Medicare Other | Source: Ambulatory Visit | Attending: Pulmonary Disease | Admitting: Pulmonary Disease

## 2022-07-24 DIAGNOSIS — R9389 Abnormal findings on diagnostic imaging of other specified body structures: Secondary | ICD-10-CM | POA: Insufficient documentation

## 2022-07-24 DIAGNOSIS — J479 Bronchiectasis, uncomplicated: Secondary | ICD-10-CM | POA: Diagnosis not present

## 2022-08-08 ENCOUNTER — Ambulatory Visit (INDEPENDENT_AMBULATORY_CARE_PROVIDER_SITE_OTHER): Payer: Medicare Other

## 2022-08-08 DIAGNOSIS — E538 Deficiency of other specified B group vitamins: Secondary | ICD-10-CM

## 2022-08-08 DIAGNOSIS — Z23 Encounter for immunization: Secondary | ICD-10-CM | POA: Diagnosis not present

## 2022-08-08 MED ORDER — CYANOCOBALAMIN 1000 MCG/ML IJ SOLN
1000.0000 ug | Freq: Once | INTRAMUSCULAR | Status: AC
  Administered 2022-08-08: 1000 ug via INTRAMUSCULAR

## 2022-08-08 NOTE — Progress Notes (Signed)
Pt received b12 injection in right deltoid, pt tolerated well.

## 2022-08-20 ENCOUNTER — Encounter: Payer: Self-pay | Admitting: Pulmonary Disease

## 2022-08-20 ENCOUNTER — Ambulatory Visit: Payer: Medicare Other | Admitting: Pulmonary Disease

## 2022-08-20 ENCOUNTER — Ambulatory Visit (INDEPENDENT_AMBULATORY_CARE_PROVIDER_SITE_OTHER): Payer: Medicare Other | Admitting: Pulmonary Disease

## 2022-08-20 VITALS — BP 138/66 | HR 74 | Ht 69.0 in | Wt 226.0 lb

## 2022-08-20 DIAGNOSIS — R0609 Other forms of dyspnea: Secondary | ICD-10-CM | POA: Diagnosis not present

## 2022-08-20 DIAGNOSIS — J849 Interstitial pulmonary disease, unspecified: Secondary | ICD-10-CM | POA: Diagnosis not present

## 2022-08-20 LAB — PULMONARY FUNCTION TEST
DL/VA % pred: 122 %
DL/VA: 4.95 ml/min/mmHg/L
DLCO cor % pred: 74 %
DLCO cor: 18.61 ml/min/mmHg
DLCO unc % pred: 74 %
DLCO unc: 18.61 ml/min/mmHg
FEF 25-75 Post: 2.7 L/sec
FEF 25-75 Pre: 4.08 L/sec
FEF2575-%Change-Post: -33 %
FEF2575-%Pred-Post: 118 %
FEF2575-%Pred-Pre: 178 %
FEV1-%Change-Post: -3 %
FEV1-%Pred-Post: 73 %
FEV1-%Pred-Pre: 76 %
FEV1-Post: 2.25 L
FEV1-Pre: 2.34 L
FEV1FVC-%Change-Post: -4 %
FEV1FVC-%Pred-Pre: 116 %
FEV6-%Change-Post: 0 %
FEV6-%Pred-Post: 70 %
FEV6-%Pred-Pre: 69 %
FEV6-Post: 2.76 L
FEV6-Pre: 2.74 L
FEV6FVC-%Change-Post: 0 %
FEV6FVC-%Pred-Post: 106 %
FEV6FVC-%Pred-Pre: 106 %
FVC-%Change-Post: 0 %
FVC-%Pred-Post: 65 %
FVC-%Pred-Pre: 65 %
FVC-Post: 2.76 L
FVC-Pre: 2.74 L
Post FEV1/FVC ratio: 82 %
Post FEV6/FVC ratio: 100 %
Pre FEV1/FVC ratio: 85 %
Pre FEV6/FVC Ratio: 100 %
RV % pred: 45 %
RV: 1.12 L
TLC % pred: 57 %
TLC: 3.9 L

## 2022-08-20 NOTE — Patient Instructions (Signed)
Full PFT Performed Today  

## 2022-08-20 NOTE — Progress Notes (Signed)
$'@Patient'R$  ID: Larry Williamson, male    DOB: 1950/12/14, 71 y.o.   MRN: [id number]  Chief complaint: Dyspnea on exertion  Referring provider: Marin Olp, MD  HPI:   71 y.o. man whom are seen in follow up for evaluation of dyspnea on exertion.  Most recent PCP note reviewed.  Patient returns for routine follow-up.  In the interim since last visit CT high-resolution scan was obtained which reviewed goals nodular and interlobular septal thickening primarily in the upper lobes are present throughout, predilection for the periphery concerning for HSP versus NSIP.  Pulmonary function test performed today revealed moderate restriction on lung volumes with preserved DLCO.  Long discussion discussed possible culprits for fibrotic HSP including HCTZ and statin.  He is only taking a statin twice a week and low-dose.  HCTZ longer standing.  Review of CT scan in 2014 reveals faint hazy groundglass opacities in the upper lobes but not nearly as severe prevalent and they are now.  He denies any exposure to birds.  HPI at initial visit: Patient reports onset of shortness of breath over the last year or so.  Okay at rest but with ongoing exertion gets short winded, fatigued.  Stable over the last year or so.  Not really worsening.  But certainly affects his ability to do things he enjoys day-to-day.  No time of day when things are better or worse.  No position make things better or worse.  No seasonal environmental factors she can identify that make things better or worse.  No other relieving or exacerbating factors.  He reports a history of of constant allergy-like symptoms.  Watery eyes, runny nose, nasal/sinus congestion.  Takes daily antihistamine.  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  Most recent chest imaging chest x-ray/2023 personally reviewed and interpreted as scattered prominent interstitial marking/thickening  with nodularity, otherwise clear.  Most recent TTE 05/2022 personally reviewed which demonstrates no significant abnormalities.  PMH: Tobacco abuse in remission, hypertension, GERD, hyperlipidemia Surgical history: Presents with extraction, multiple colonoscopy with polyp removal Family history: Father lung cancer, mother with dementia Social history: Former smoker, quit 1982, 15-pack-year, lives in [facility], worked in Architect all his life, some asbestos exposure   Licensed conveyancer / Pulmonary Flowsheets:   ACT:      No data to display           MMRC:     No data to display           Epworth:      No data to display           Tests:   FENO:  No results found for: "NITRICOXIDE"  PFT:    Latest Ref Rng & Units 08/20/2022   11:42 AM  PFT Results  FVC-Pre L 2.74  P  FVC-Predicted Pre % 65  P  FVC-Post L 2.76  P  FVC-Predicted Post % 65  P  Pre FEV1/FVC % % 85  P  Post FEV1/FCV % % 82  P  FEV1-Pre L 2.34  P  FEV1-Predicted Pre % 76  P  FEV1-Post L 2.25  P  DLCO uncorrected ml/min/mmHg 18.61  P  DLCO UNC% % 74  P  DLCO corrected ml/min/mmHg 18.61  P  DLCO COR %Predicted % 74  P  DLVA Predicted % 122  P  TLC L 3.90  P  TLC % Predicted % 57  P  RV % Predicted % 45  P    P Preliminary result   Personally reviewed interpreted as spirometry suggestive of moderate restriction versus air trapping.  Lung volumes confirm moderate restriction.  DLCO within normal limits.  WALK:      No data to display           Imaging: Personally reviewed and as per EMR discussion this note CT Chest High Resolution  Result Date: 07/26/2022 CLINICAL DATA:  Abnormal chest radiograph EXAM: CT CHEST WITHOUT CONTRAST TECHNIQUE: Multidetector CT imaging of the chest was performed following the standard protocol without intravenous contrast. High resolution imaging of the lungs, as well as inspiratory and expiratory imaging, was performed. RADIATION DOSE  REDUCTION: This exam was performed according to the departmental dose-optimization program which includes automated exposure control, adjustment of the mA and/or kV according to patient size and/or use of iterative reconstruction technique. COMPARISON:  Chest x-ray dated February 08, 2022; chest CT dated January 15, 2012 FINDINGS: Cardiovascular: Normal heart size. No pericardial effusion. Severe left main and three-vessel coronary artery calcifications. Normal caliber thoracic aorta with moderate calcified plaque. Mediastinum/Nodes: Small hiatal hernia. Thyroid is unremarkable. Mildly enlarged mediastinal lymph nodes, likely reactive. Reference right lower paratracheal lymph node measuring 1.3 cm on series 2, image 58. Lungs/Pleura: Peribronchovascular and subpleural reticular and ground-glass opacities with a slight upper lobe predominance. Traction bronchiectasis with no evidence of honeycomb change. Mild areas of mosaic attenuation. No definite air trapping, although expiratory views are technically adequate. Findings are new when compared with 2013 prior chest CT. Upper Abdomen: No acute abnormality. Musculoskeletal: Mild compression deformity of T7, unchanged when compared with the prior no chest wall mass or suspicious bone lesions identified. IMPRESSION: 1. Peribronchovascular and subpleural reticular and ground-glass opacities with a slight upper lobe predominance. Findings are new when compared with 2013 prior chest CT. Fibrotic HP is a consideration, although can not evaluate for air trapping due to inadequate expiratory views. Findings are suggestive of an alternative diagnosis (not UIP) per consensus guidelines: Diagnosis of Idiopathic Pulmonary Fibrosis: An Official ATS/ERS/JRS/ALAT Clinical Practice Guideline. [place], Iss 5, [id number], Jun 21 2017. 2. Severe left main and three-vessel coronary artery calcifications. 3. Mildly enlarged mediastinal lymph nodes, likely reactive. 4.  Small hiatal hernia. 5. Aortic Atherosclerosis (ICD10-I70.0). Electronically Signed   By: Yetta Glassman M.D.   On: 07/26/2022 09:31    Lab Results: Personally reviewed, eosinophils 400 CBC    Component Value Date/Time   WBC 7.1 05/22/2022 1209   RBC 4.67 05/22/2022 1209   HGB 14.1 05/22/2022 1209   HCT 41.4 05/22/2022 1209   PLT 239.0 05/22/2022 1209   MCV 88.7 05/22/2022 1209   MCH 31.1 01/31/2021 1734   MCHC 34.1 05/22/2022 1209   RDW 13.9 05/22/2022 1209   LYMPHSABS 2.5 05/22/2022 1209   MONOABS 0.6 05/22/2022 1209   EOSABS 0.4 05/22/2022 1209   BASOSABS 0.1 05/22/2022 1209    BMET    Component Value Date/Time   NA 138 05/22/2022 1209   K 4.2 05/22/2022 1209   CL 101 05/22/2022 1209   CO2 24 05/22/2022 1209   GLUCOSE 87 05/22/2022 1209   BUN 14 05/22/2022 1209   CREATININE 1.14 05/22/2022 1209   CREATININE 1.02 07/06/2020 0849   CALCIUM 9.2 05/22/2022 1209   CALCIUM 9.3 03/09/2008 2243   GFRNONAA >60 01/31/2021 0050   GFRNONAA 75 07/06/2020 0849   GFRAA 87 07/06/2020 0849    BNP No results  found for: "BNP"  ProBNP No results found for: "PROBNP"  Specialty Problems       Pulmonary Problems   Allergic rhinitis    zyrtec       Dyspnea on exertion    Allergies  Allergen Reactions   Atorvastatin     Other reaction(s): Cramp    Immunization History  Administered Date(s) Administered   Fluad Quad(high Dose 65+) 06/22/2019, 07/06/2020, 07/05/2021, 08/08/2022   Influenza Split 07/30/2011, 07/10/2012, 06/21/2016   Influenza Whole 08/04/2007, 07/21/2008, 07/18/2009, 07/16/2010   Influenza, High Dose Seasonal PF 07/14/2015, 07/16/2016, 07/17/2017, 07/21/2017, 07/09/2018   Influenza,inj,Quad PF,6+ Mos 07/05/2013, 08/03/2014, 07/13/2015   Influenza-Unspecified 06/22/2019, 07/06/2020, 06/21/2021   PFIZER(Purple Top)SARS-COV-2 Vaccination 11/21/2019, 12/12/2019, 10/03/2020   Pneumococcal Conjugate-13 01/09/2016   Pneumococcal Polysaccharide-23  04/01/2017   Td 10/22/1999, 12/11/2009   Tdap 07/28/2015   Zoster Recombinat (Shingrix) 02/26/2020, 04/26/2020    Past Medical History:  Diagnosis Date   Adenomatous colon polyp 06/2006   ALLERGIC RHINITIS    Allergy    seasonal allergies   Anemia    hx of   ANEMIA DUE TO DIETARY IRON DEFICIENCY 05/28/2007   Due to giving regularly giving blood. Resolved with cutting in half.      COMPRESSION FRACTURE, THORACIC VERTEBRA 03/09/2008   2009, no chronic pain    Diverticulitis of colon    DIVERTICULOSIS, COLON 05/28/2007   Qualifier: Diagnosis of  By: Arnoldo Morale MD, Balinda Quails    GERD (gastroesophageal reflux disease)    on meds   Hyperlipidemia    on meds   Hypertension    on meds   Melanoma ([place]) 02/23/2014   MM in situe, early, low mid back, exc   Squamous cell carcinoma in situ 11/10/2018   Top of right shoulder, CX3, 5FU   Squamous cell carcinoma of skin    SCC IN SITU TOP OF RIGHT SHOULDER TX CX3 5FU   Ulcer 1992   gastric    Tobacco History: Social History   Tobacco Use  Smoking Status Former   Packs/day: 1.00   Years: 15.00   Total pack years: 15.00   Types: Cigarettes   Quit date: 10/21/1978   Years since quitting: 43.8  Smokeless Tobacco Never   Counseling given: Not Answered   Continue to not smoke  Outpatient Encounter Medications as of 08/20/2022  Medication Sig   amLODipine (NORVASC) 5 MG tablet TAKE 1 TABLET(2.5 MG) BY MOUTH DAILY   cetirizine (ZYRTEC) 10 MG tablet Take 10 mg by mouth daily.   Cyanocobalamin 2000 MCG/ML SOLN Inject 1,000 mcg as directed every 30 (thirty) days. Patient comes get his b12 injection every 3rd Thursday in the month.   gabapentin (NEURONTIN) 300 MG capsule TAKE 1 CAPSULE(300 MG) BY MOUTH TWICE DAILY   irbesartan-hydrochlorothiazide (AVALIDE) 300-12.5 MG tablet TAKE 1 TABLET BY MOUTH DAILY   MULTIPLE VITAMIN PO Take 1 tablet by mouth daily.   Omega-3 Fatty Acids (FISH OIL) 1000 MG CAPS Take 2 capsules by mouth daily.   omeprazole  (PRILOSEC) 40 MG capsule TAKE 1 CAPSULE BY MOUTH EVERY DAY   rosuvastatin (CRESTOR) 5 MG tablet Take 1 tablet (5 mg total) by mouth 2 (two) times a week.   tadalafil (CIALIS) 10 MG tablet Take 5 mg by mouth daily as needed for erectile dysfunction.   tizanidine (ZANAFLEX) 2 MG capsule TAKE 1 CAPSULE(2 MG) BY MOUTH TWICE DAILY AS NEEDED FOR MUSCLE SPASMS   No facility-administered encounter medications on file as of 08/20/2022.  Review of Systems  Review of Systems  N/a Physical Exam  BP 138/66 (BP Location: Left Arm, Patient Position: Sitting, Cuff Size: Normal)   Pulse 74   Ht '5\' 9"'$  (1.753 m)   Wt 226 lb (102.5 kg)   SpO2 96%   BMI 33.37 kg/m   Wt Readings from Last 5 Encounters:  08/20/22 226 lb (102.5 kg)  07/10/22 224 lb 3.2 oz (101.7 kg)  05/22/22 222 lb 9.6 oz (101 kg)  11/19/21 222 lb 3.2 oz (100.8 kg)  11/15/21 222 lb (100.7 kg)    BMI Readings from Last 5 Encounters:  08/20/22 33.37 kg/m  07/10/22 33.11 kg/m  05/22/22 32.87 kg/m  11/19/21 32.81 kg/m  11/15/21 32.78 kg/m     Physical Exam General: Well-appearing, sitting in chair Eyes: EOMI, icterus Neck: Supple, no JVP Pulmonary: Clear, normal work of breathing, no crackles Abdomen: Nondistended, bowel sounds present Cardiovascular: Warm, no edema, regular rate and rhythm, MSK: No synovitis, no joint effusion Neuro: Normal gait, no weakness Psych: Normal mood, full affect   Assessment & Plan:   Dyspnea on exertion: Recent TTE reassuring without significant abnormality.  Chest imaging consistent with ILD, fibrotic HSP versus fibrotic NSIP.  Likely largest contributor to symptoms.  PFTs confirm moderate restriction with preserved DLCO.  Interstitial lung disease: Favor fibrotic HSP versus NSIP.  A lot of evidence of active inflammation on imaging.  Discussed role and rationale for antifibrotic therapy to help slow progression of this.  I recommended we initiate this medication.  We discussed  risks and benefits.  He declines at this time stating he feels fine.  We will repeat PFTs in 6 months.  If worsening plan to repeat CT scan and then reevaluate role of antifibrotic's.   Return in about 6 months (around 02/18/2023).   Lanier Clam, MD 08/20/2022  I spent 45 minutes in the care of the patient including face-to-face visit, review of records, coordination of care.

## 2022-08-20 NOTE — Progress Notes (Signed)
Full PFT Performed Today  

## 2022-08-20 NOTE — Patient Instructions (Signed)
To see you again  No changes to medication  The CT scan and breathing tests are consistent with scarring or fibrosis on the lung.  Or restrictive lung disease.  We will repeat pulmonary function test in 6 months.  If these look worse then we will repeat a CT scan and have another conversation about medications to slow fibrosis.  Return to clinic in 6 months with PFTs prior to visit with Dr. Silas Flood

## 2022-09-03 ENCOUNTER — Ambulatory Visit: Payer: Medicare Other | Admitting: Gastroenterology

## 2022-09-05 ENCOUNTER — Ambulatory Visit (INDEPENDENT_AMBULATORY_CARE_PROVIDER_SITE_OTHER): Payer: Medicare Other

## 2022-09-05 DIAGNOSIS — E538 Deficiency of other specified B group vitamins: Secondary | ICD-10-CM | POA: Diagnosis not present

## 2022-09-05 MED ORDER — CYANOCOBALAMIN 1000 MCG/ML IJ SOLN
1000.0000 ug | Freq: Once | INTRAMUSCULAR | Status: AC
  Administered 2022-09-05: 1000 ug via INTRAMUSCULAR

## 2022-09-05 NOTE — Progress Notes (Signed)
Pt received b12 in left deltoid, pt tolerated well. 

## 2022-09-16 ENCOUNTER — Encounter: Payer: Self-pay | Admitting: Family Medicine

## 2022-09-17 LAB — COMPREHENSIVE METABOLIC PANEL
Albumin: 3.9 (ref 3.5–5.0)
Calcium: 8.7 (ref 8.7–10.7)

## 2022-09-17 LAB — BASIC METABOLIC PANEL
BUN: 18 (ref 4–21)
CO2: 24 — AB (ref 13–22)
Chloride: 105 (ref 99–108)
Creatinine: 1.1 (ref 0.6–1.3)
Glucose: 102
Potassium: 4 mEq/L (ref 3.5–5.1)
Sodium: 136 — AB (ref 137–147)

## 2022-09-17 LAB — HEPATIC FUNCTION PANEL
ALT: 48 U/L — AB (ref 10–40)
AST: 29 (ref 14–40)
Alkaline Phosphatase: 50 (ref 25–125)
Bilirubin, Direct: 0.2
Bilirubin, Total: 0.9

## 2022-09-17 LAB — CBC AND DIFFERENTIAL
HCT: 41 (ref 41–53)
Hemoglobin: 15.2 (ref 13.5–17.5)
Platelets: 200 10*3/uL (ref 150–400)
WBC: 7.3

## 2022-09-17 LAB — CBC: RBC: 4.95 (ref 3.87–5.11)

## 2022-09-18 NOTE — Telephone Encounter (Signed)
Please see the patient message regarding new prescription to start blood thinner and advise.

## 2022-10-01 NOTE — Progress Notes (Deleted)
Chronic Care Management Pharmacy Note  10/01/2022 Name:  Larry Williamson MRN:  [id number] DOB:  1951-07-18  Summary: Recommended patient get fasting lipid panel since we have not done sine early 2021.  He is agreeable and willing to increase to twice weekly Crestor if we need to. ________ Subjective: Larry Williamson is an 71 y.o. year old male who is a primary patient of Hunter, Brayton Mars, MD.  The CCM team was consulted for assistance with disease management and care coordination needs.    Engaged with patient by telephone for follow up visit in response to provider referral for pharmacy case management and/or care coordination services.   Consent to Services:  The patient was given information about Chronic Care Management services, agreed to services, and gave verbal consent prior to initiation of services.  Please see initial visit note for detailed documentation.   Patient Care Team: Marin Olp, MD as PCP - General (Family Medicine) Lavonna Monarch, MD (Inactive) as Consulting Physician (Dermatology) Edythe Clarity, [facility] (Pharmacist)  Recent office visits:  06/21/21 Colin Benton, DO [facility] Cold symptoms, Started Benzonatate 257m 1 Cap by mouth 2 times daily as needed for cough. Follow up as needed.    Recent consult visits:  None   Hospital visits:  None   Objective: Lab Results  Component Value Date   CREATININE 1.14 05/22/2022   CREATININE 1.13 02/15/2021   CREATININE 1.14 01/31/2021    Lab Results  Component Value Date   HGBA1C 5.4 05/22/2022   Last diabetic Eye exam: No results found for: "HMDIABEYEEXA"  Last diabetic Foot exam: No results found for: "HMDIABFOOTEX"      Component Value Date/Time   CHOL 184 05/22/2022 1209   TRIG 214.0 (H) 05/22/2022 1209   HDL 48.60 05/22/2022 1209   CHOLHDL 4 05/22/2022 1209   VLDL 42.8 (H) 05/22/2022 1209   LDLCALC 91 09/28/2021 0825   LDLDIRECT 109.0 05/22/2022 1209       Latest Ref Rng & Units  05/22/2022   12:09 PM 02/15/2021    4:32 PM 01/31/2021   12:50 AM  Hepatic Function  Total Protein 6.0 - 8.3 g/dL 7.2  7.0  6.4   Albumin 3.5 - 5.2 g/dL 4.4  4.2  3.5   AST 0 - 37 U/L 22  34  36   ALT 0 - 53 U/L 25  41  47   Alk Phosphatase 39 - 117 U/L 40  33  29   Total Bilirubin 0.2 - 1.2 mg/dL 0.8  0.6  1.3     Lab Results  Component Value Date/Time   TSH 2.56 07/06/2015 08:01 AM   TSH 2.49 07/04/2014 07:58 AM       Latest Ref Rng & Units 05/22/2022   12:09 PM 02/15/2021    4:32 PM 02/08/2021    8:52 AM  CBC  WBC 4.0 - 10.5 K/uL 7.1  8.1  7.1   Hemoglobin 13.0 - 17.0 g/dL 14.1  12.4  11.7   Hematocrit 39.0 - 52.0 % 41.4  36.0  33.9   Platelets 150.0 - 400.0 K/uL 239.0  228.0  229.0     No results found for: "VD25OH"  Clinical ASCVD:  The 10-year ASCVD risk score (Arnett DK, et al., 2019) is: 26%   Values used to calculate the score:     Age: [age]years     Sex: Male     Is Non-Hispanic African American: No     Diabetic:  No     Tobacco smoker: No     Systolic Blood Pressure: 778 mmHg     Is BP treated: Yes     HDL Cholesterol: 48.6 mg/dL     Total Cholesterol: 184 mg/dL    Other: (CHADS2VASc if Afib, PHQ9 if depression, MMRC or CAT for COPD, ACT, DEXA)  Social History   Tobacco Use  Smoking Status Former   Packs/day: 1.00   Years: 15.00   Total pack years: 15.00   Types: Cigarettes   Quit date: 10/21/1978   Years since quitting: 43.9  Smokeless Tobacco Never   BP Readings from Last 3 Encounters:  08/20/22 138/66  07/10/22 (!) 144/64  05/22/22 130/66   Pulse Readings from Last 3 Encounters:  08/20/22 74  07/10/22 75  05/22/22 65   Wt Readings from Last 3 Encounters:  08/20/22 226 lb (102.5 kg)  07/10/22 224 lb 3.2 oz (101.7 kg)  05/22/22 222 lb 9.6 oz (101 kg)    Assessment: Review of patient past medical history, allergies, medications, health status, including review of consultants reports, laboratory and other test data, was performed as part of  comprehensive evaluation and provision of chronic care management services.   SDOH:  (Social Determinants of Health) assessments and interventions performed: Yes. SDOH Interventions    Flowsheet Row Chronic Care Management from 08/28/2020 in [place]  SDOH Interventions   Transportation Interventions Other (Comment)      [place]  Allergies  Allergen Reactions   Atorvastatin     Other reaction(s): Cramp    Medications Reviewed Today     Reviewed by Lanier Clam, MD (Physician) on 08/20/22 at 1321  Med List Status: <None>   Medication Order Taking? Sig Documenting Provider Last Dose Status Informant  amLODipine (NORVASC) 5 MG tablet [id number] Yes TAKE 1 TABLET(2.5 MG) BY MOUTH DAILY Marin Olp, MD Taking Active   cetirizine (ZYRTEC) 10 MG tablet [id number] Yes Take 10 mg by mouth daily. [provider] Taking Active Self  Cyanocobalamin 2000 MCG/ML SOLN [id number] Yes Inject 1,000 mcg as directed every 30 (thirty) days. Patient comes get his b12 injection every 3rd Thursday in the month. [provider] Taking Active   gabapentin (NEURONTIN) 300 MG capsule [id number] Yes TAKE 1 CAPSULE(300 MG) BY MOUTH TWICE DAILY Magnus Sinning, MD Taking Active   irbesartan-hydrochlorothiazide (AVALIDE) 300-12.5 MG tablet [id number] Yes TAKE 1 TABLET BY MOUTH DAILY Marin Olp, MD Taking Active   MULTIPLE VITAMIN PO [id number] Yes Take 1 tablet by mouth daily. [provider] Taking Active Self  Omega-3 Fatty Acids (FISH OIL) 1000 MG CAPS [id number] Yes Take 2 capsules by mouth daily. [provider] Taking Active Self  omeprazole (PRILOSEC) 40 MG capsule [id number] Yes TAKE 1 CAPSULE BY MOUTH EVERY DAY Marin Olp, MD Taking Active   rosuvastatin (CRESTOR) 5 MG tablet [id number] Yes Take 1 tablet (5 mg total) by mouth 2 (two) times a week. Marin Olp, MD Taking Active   tadalafil (CIALIS) 10 MG tablet [id number]  Yes Take 5 mg by mouth daily as needed for erectile dysfunction. [provider] Taking Active Self  tizanidine (ZANAFLEX) 2 MG capsule [id number] Yes TAKE 1 CAPSULE(2 MG) BY MOUTH TWICE DAILY AS NEEDED FOR MUSCLE SPASMS Marin Olp, MD Taking Active Self           Patient Active Problem List   Diagnosis Date Noted   Dyspnea on exertion 05/22/2022   Lower GI bleed  01/30/2021   Myalgia due to statin 07/06/2020   B12 deficiency 12/03/2019   Dupuytren's contracture of right hand 10/28/2019   Hyperglycemia 10/01/2016   AAA (abdominal aortic aneurysm) ([place]) 01/18/2016   Meralgia paresthetica 12/28/2014   History of skin cancer 12/28/2014   Former smoker 12/28/2014   Abnormal liver enzymes 12/29/2010   Allergic rhinitis 06/17/2008   ERECTILE DYSFUNCTION 12/07/2007   History of colonic polyps 16/10/930   Umbilical hernia 35/57/3220   Hyperlipidemia 05/15/2007   Essential hypertension 05/15/2007   GERD 05/15/2007    Immunization History  Administered Date(s) Administered   Fluad Quad(high Dose 65+) 06/22/2019, 07/06/2020, 07/05/2021, 08/08/2022   Influenza Split 07/30/2011, 07/10/2012, 06/21/2016   Influenza Whole 08/04/2007, 07/21/2008, 07/18/2009, 07/16/2010   Influenza, High Dose Seasonal PF 07/14/2015, 07/16/2016, 07/17/2017, 07/21/2017, 07/09/2018   Influenza,inj,Quad PF,6+ Mos 07/05/2013, 08/03/2014, 07/13/2015   Influenza-Unspecified 06/22/2019, 07/06/2020, 06/21/2021   PFIZER(Purple Top)SARS-COV-2 Vaccination 11/21/2019, 12/12/2019, 10/03/2020   Pneumococcal Conjugate-13 01/09/2016   Pneumococcal Polysaccharide-23 04/01/2017   Td 10/22/1999, 12/11/2009   Tdap 07/28/2015   Zoster Recombinat (Shingrix) 02/26/2020, 04/26/2020   Conditions to be addressed/monitored: AAA Hx, HLD, HTN, GI bleed Hx, B12 deficiency, GERD, ED     There are no care plans that you recently modified to display for this patient.   Patient's preferred pharmacy is:  [id number] [place], [place] AT [address] [place] [address] Phone: [phone] Fax: [phone]  [id number] [id number]: [phone]: [phone]  Follow Up:  Patient agrees to Care Plan and Follow-up.  Future Appointments  Date Time Provider [place] 10/03/2022  9:00 AM [facility] NURSE [facility]  10/08/2022  2:00 PM [facility] CCM PHARMACIST [facility]  11/22/2022  8:00 AM [facility] HEALTH COACH [facility] [place] PharmD Clinical Pharmacist  [place]([phone]    Current Barriers:  Unable to maintain control of HLD  Pharmacist Clinical Goal(s):  Patient will contact provider office for questions/concerns as evidenced notation of same in electronic health record through collaboration with PharmD and provider.   Interventions: 1:1 collaboration with HMarin Olp MD regarding development and update of comprehensive plan of care as evidenced by provider attestation and co-signature Inter-disciplinary care team collaboration (see longitudinal plan of care) Comprehensive medication review performed; medication list updated in electronic medical record No Rx Changes  Hypertension (BP goal <140/90) -Controlled -Current treatment: Amlodipine 2.5 mg once daily Irbesartan-HCTZ 300-12.5 mg once daily   -Current home readings: did not provide today -Current dietary habits: 'no changes'  -Current exercise habits: no formal exercise, works every day  -Denies hypotensive/hypertensive symptoms -Counseled to monitor BP at home at least 1-2x per month, document, and provide log at future appointments -Counseled on diet and exercise extensively Recommended to continue current medication  Update 09/25/21 127/78 most recent home blood pressure He reports it is normally 1277O systolic.   Denies any dizziness or headaches at home. No changes to meds reports 100% adherence.  He has lost about 20 pounds over the last 5 months or so.  Mainly cutting back on portion sizes.  He also cut back on potatoes, rice, and other starches.  Plans to continue this lifestyle for the long term.  Congratulated on success with weight loss! BP in office controlled the last few visits. Discussed implementing exercise routine as secondary  lifestyle modification. No changes needed at this time, continue current meds.  Continue home monitoring. Will FU on BP in about 3 months.   Hyperlipidemia: (LDL goal < 70) -Uncontrolled.  -Current treatment: Crestor 5 mg once weekly  -Medications previously tried: myalgia, several statin trials - atorvastatin, pravastatin  -Educated on Benefits of statin for ASCVD risk reduction; Reports to tolerate rosuvastatin 5 mg once weekly - reviewed cholesterol goals at length with patient, could consider zetia (no hx on file)  -Did not review alcohol intake today  -Counseled on diet and exercise extensively Recommended to continue current medication   Update 09/25/21 Continues on once weekly Crestor dose.  Last LDL elevated in January of 2021.  He has history of myalgias on atorvastatin and pravastatin.  He is coming in two days for B12 vaccine, plans to get fasting lipid panel at that time if able to.  He is agreeable to increase to twice weekly if we need to to better control lipids.   No changes at this time, consider increase should we need to pending fasting labs.  GERD (Goal: minimize symptoms) -Controlled  -No progress noted in terms of weight loss, reflux controlled if taking daily. -Previous GI bleed following polyp removal, gastric ulcer noted in 1992 -Current treatment  Omeprazole 40 mg once daily  -Medications previously tried: failed omeprazole reduction to 20 mg - previous goal to get weight below 200 before considering   -Recommended to  continue current medication  Patient Goals/Self-Care Activities Patient will:  - take medications as prescribed  Follow Up Plan: FU on fasting lipids, HC FU on BP in 90 days   Medication Assistance: None required.  Patient affirms current coverage meets needs.

## 2022-10-03 ENCOUNTER — Ambulatory Visit (INDEPENDENT_AMBULATORY_CARE_PROVIDER_SITE_OTHER): Payer: Medicare Other

## 2022-10-03 DIAGNOSIS — E538 Deficiency of other specified B group vitamins: Secondary | ICD-10-CM

## 2022-10-03 MED ORDER — CYANOCOBALAMIN 1000 MCG/ML IJ SOLN
1000.0000 ug | Freq: Once | INTRAMUSCULAR | Status: AC
  Administered 2022-10-03: 1000 ug via INTRAMUSCULAR

## 2022-10-03 NOTE — Progress Notes (Signed)
Pt here for B12 injection for Dr.Hunter. Injection given in left deltoid. Pt tolerated well.

## 2022-10-08 ENCOUNTER — Telehealth: Payer: Medicare Other

## 2022-10-15 ENCOUNTER — Ambulatory Visit: Payer: Self-pay | Admitting: *Deleted

## 2022-10-15 ENCOUNTER — Encounter: Payer: Self-pay | Admitting: *Deleted

## 2022-10-15 NOTE — Progress Notes (Signed)
Mr. Larry Williamson has been un-enrolled from the Chronic Care Management program as advised by his primary care provider, Dr. Marin Olp. Mr. [name] was notified via [place]. The Chronic Care Management team is happy to assist with Chronic Care Management needs for Mr. [name] should this be advised in the future.   [facility]  Chronic Care Management  Managed [facility]  Care Coordination

## 2022-10-18 ENCOUNTER — Other Ambulatory Visit: Payer: Self-pay | Admitting: Physical Medicine and Rehabilitation

## 2022-10-23 ENCOUNTER — Other Ambulatory Visit: Payer: Self-pay | Admitting: Physical Medicine and Rehabilitation

## 2022-10-28 ENCOUNTER — Telehealth: Payer: Medicare Other

## 2022-11-05 ENCOUNTER — Ambulatory Visit (INDEPENDENT_AMBULATORY_CARE_PROVIDER_SITE_OTHER): Payer: Medicare Other

## 2022-11-05 DIAGNOSIS — E538 Deficiency of other specified B group vitamins: Secondary | ICD-10-CM

## 2022-11-05 MED ORDER — CYANOCOBALAMIN 1000 MCG/ML IJ SOLN
1000.0000 ug | Freq: Once | INTRAMUSCULAR | Status: AC
  Administered 2022-11-05: 1000 ug via INTRAMUSCULAR

## 2022-11-05 NOTE — Progress Notes (Signed)
Larry Williamson 72 yr old male presents to office today for monthly B12 injection per Garret Reddish, MD. Administered CYANOCOBALAMIN 1,000 mcg IM left arm. Patient tolerated well. Aware to return next month for next injection.

## 2022-11-26 ENCOUNTER — Ambulatory Visit (INDEPENDENT_AMBULATORY_CARE_PROVIDER_SITE_OTHER): Payer: Medicare Other

## 2022-11-26 VITALS — BP 130/78 | HR 75 | Temp 97.8°F | Wt 229.0 lb

## 2022-11-26 DIAGNOSIS — Z0000 Encounter for general adult medical examination without abnormal findings: Secondary | ICD-10-CM | POA: Diagnosis not present

## 2022-11-26 NOTE — Patient Instructions (Signed)
Larry Williamson , Thank you for taking time to come for your Medicare Wellness Visit. I appreciate your ongoing commitment to your health goals. Please review the following plan we discussed and let me know if I can assist you in the future.   These are the goals we discussed:  Goals      LDL < 100     Timeframe:  Long-Range Goal Priority:  High Start Date:  09/25/21                           Expected End Date:  03/26/22      Follow Up Date 12/24/21    - Get fasting lipid panel. Goal LDL < 100.   Why is this important?   These steps will help you keep on track with your medicines.   Notes: Willing to increase statin to twice weekly     Patient Stated     Living to be 89     Patient Stated     Stay healthy and active      [place] (see longitudinal plan of care for additional care plan information)  Current Barriers:  Chronic Disease Management support, education, and care coordination needs related to Hypertension, Hyperlipidemia, and GERD   Hypertension BP Readings from Last 3 Encounters:  07/06/20 130/78  01/03/20 132/78  01/03/20 132/78  Pharmacist Clinical Goal(s): Over the next 180 days, patient will work with PharmD and providers to achieve BP goal <130/80 Current regimen:  Amlodipine 2.5 mg once daily Irbesartan-hctz 300-12.5 mg once daily Interventions: We discussed diet and exercise extensively. We discussed home BP monitoring recommendations. Patient self care activities - Over the next 180 days, patient will: Check BP at home at least once every 1-2 weeks, document, and provide at future appointments Use silver sneakers benefit, focus on fruit/vegetable intake, white meats over red meats, having nuts in place of sugary snacks. Ensure daily salt intake < 2300 mg/day  Hyperlipidemia Lab Results  Component Value Date/Time   LDLCALC 114 (H) 10/28/2019 08:38 AM   LDLDIRECT 116 (H) 07/06/2020 08:49 AM  Pharmacist Clinical  Goal(s): Over the next 180 days, patient will work with PharmD and providers to achieve LDL goal < 100 Current regimen:  Crestor 5 mg once daily Interventions: We discussed diet and exercise extensively  Patient self care activities - Over the next 180 days, patient will: Use silver sneakers benefit, focus on fruit/vegetable intake, white meats over red meats, having nuts in place of sugary snacks. GERD Pharmacist Clinical Goal(s) Over the next 180 days, patient will work with PharmD and providers to minimize acid reflux symptoms Current regimen:  Omeprazole 40 mg once daily  Interventions: Reviewed dietary triggers of acid reflux Medication related side effects - if able to get off of omeprazole may then be able to stop / reduce frequency of b12 injections.  Patient self care activities - Over the next 180 days, patient will: Be mindful of potential triggers such as alcohol, fatty foods, lying down after eating, and tomato sauce. Weight loss to potentially help with reflux symptoms  Medication management Pharmacist Clinical Goal(s): Over the next 180 days, patient will work with PharmD and providers to maintain optimal medication adherence Current pharmacy: [facility] Interventions Comprehensive medication review performed. Continue current medication management strategy Patient self care activities - Over the next 180 days, patient will: Take medications as prescribed Report any questions or concerns  to PharmD and/or provider(s)  Initial goal documentation     Weight (lb) < 200 lb (90.7 kg)     Did watch what he eat  Check out  online nutrition programs as GumSearch.nl and http://vang.com/; fit32m; Look for foods with "whole" wheat; bran; oatmeal etc Shot at the farmer's markets in season for fresher choices  Watch for "hydrogenated" on the label of oils which are trans-fats.  Watch for "high fructose corn syrup" in snacks, yogurt or ketchup  Meats have less marbling;  bright colored fruits and vegetables;  Canned; dump out liquid and wash vegetables. Be mindful of what we are eating  Portion control is essential to a health weight! Sit down; take a break and enjoy your meal; take smaller bites; put the fork down between bites;  It takes 20 minutes to get full; so check in with your fullness cues and stop eating when you start to fill full               This is a list of the screening recommended for you and due dates:  Health Maintenance  Topic Date Due   COVID-19 Vaccine (4 - 2023-24 season) 06/21/2022   Hepatitis C Screening: USPSTF Recommendation to screen - Ages 18-79 yo.  10/17/2098*   Medicare Annual Wellness Visit  11/27/2023   Colon Cancer Screening  09/05/2024   DTaP/Tdap/Td vaccine (4 - Td or Tdap) 07/27/2025   Pneumonia Vaccine  Completed   Flu Shot  Completed   Zoster (Shingles) Vaccine  Completed   HPV Vaccine  Aged Out  *Topic was postponed. The date shown is not the original due date.    Advanced directives: Please bring a copy of your health care power of attorney and living will to the office at your convenience.  Conditions/risks identified: stay healthy and active   Next appointment: Follow up in one year for your annual wellness visit.   Preventive Care 631Years and Older, Male  Preventive care refers to lifestyle choices and visits with your health care provider that can promote health and wellness. What does preventive care include? A yearly physical exam. This is also called an annual well check. Dental exams once or twice a year. Routine eye exams. Ask your health care provider how often you should have your eyes checked. Personal lifestyle choices, including: Daily care of your teeth and gums. Regular physical activity. Eating a healthy diet. Avoiding tobacco and drug use. Limiting alcohol use. Practicing safe sex. Taking low doses of aspirin every day. Taking vitamin and mineral supplements as  recommended by your health care provider. What happens during an annual well check? The services and screenings done by your health care provider during your annual well check will depend on your age, overall health, lifestyle risk factors, and family history of disease. Counseling  Your health care provider may ask you questions about your: Alcohol use. Tobacco use. Drug use. Emotional well-being. Home and relationship well-being. Sexual activity. Eating habits. History of falls. Memory and ability to understand (cognition). Work and work eStatistician Screening  You may have the following tests or measurements: Height, weight, and BMI. Blood pressure. Lipid and cholesterol levels. These may be checked every 5 years, or more frequently if you are over 580years old. Skin check. Lung cancer screening. You may have this screening every year starting at age 3524if you have a 30-pack-year history of smoking and currently smoke or have quit within the past 15 years. Fecal occult blood test (  FOBT) of the stool. You may have this test every year starting at age 69. Flexible sigmoidoscopy or colonoscopy. You may have a sigmoidoscopy every 5 years or a colonoscopy every 10 years starting at age 88. Prostate cancer screening. Recommendations will vary depending on your family history and other risks. Hepatitis C blood test. Hepatitis B blood test. Sexually transmitted disease (STD) testing. Diabetes screening. This is done by checking your blood sugar (glucose) after you have not eaten for a while (fasting). You may have this done every 1-3 years. Abdominal aortic aneurysm (AAA) screening. You may need this if you are a current or former smoker. Osteoporosis. You may be screened starting at age 52 if you are at high risk. Talk with your health care provider about your test results, treatment options, and if necessary, the need for more tests. Vaccines  Your health care provider may recommend  certain vaccines, such as: Influenza vaccine. This is recommended every year. Tetanus, diphtheria, and acellular pertussis (Tdap, Td) vaccine. You may need a Td booster every 10 years. Zoster vaccine. You may need this after age 61. Pneumococcal 13-valent conjugate (PCV13) vaccine. One dose is recommended after age 105. Pneumococcal polysaccharide (PPSV23) vaccine. One dose is recommended after age 42. Talk to your health care provider about which screenings and vaccines you need and how often you need them. This information is not intended to replace advice given to you by your health care provider. Make sure you discuss any questions you have with your health care provider. Document Released: 11/03/2015 Document Revised: 06/26/2016 Document Reviewed: 08/08/2015 Elsevier Interactive Patient Education  2017 [place] Prevention in the Home Falls can cause injuries. They can happen to people of all ages. There are many things you can do to make your home safe and to help prevent falls. What can I do on the outside of my home? Regularly fix the edges of walkways and driveways and fix any cracks. Remove anything that might make you trip as you walk through a door, such as a raised step or threshold. Trim any bushes or trees on the path to your home. Use bright outdoor lighting. Clear any walking paths of anything that might make someone trip, such as rocks or tools. Regularly check to see if handrails are loose or broken. Make sure that both sides of any steps have handrails. Any raised decks and porches should have guardrails on the edges. Have any leaves, snow, or ice cleared regularly. Use sand or salt on walking paths during winter. Clean up any spills in your garage right away. This includes oil or grease spills. What can I do in the bathroom? Use night lights. Install grab bars by the toilet and in the tub and shower. Do not use towel bars as grab bars. Use non-skid mats or  decals in the tub or shower. If you need to sit down in the shower, use a plastic, non-slip stool. Keep the floor dry. Clean up any water that spills on the floor as soon as it happens. Remove soap buildup in the tub or shower regularly. Attach bath mats securely with double-sided non-slip rug tape. Do not have throw rugs and other things on the floor that can make you trip. What can I do in the bedroom? Use night lights. Make sure that you have a light by your bed that is easy to reach. Do not use any sheets or blankets that are too big for your bed. They should not hang down  onto the floor. Have a firm chair that has side arms. You can use this for support while you get dressed. Do not have throw rugs and other things on the floor that can make you trip. What can I do in the kitchen? Clean up any spills right away. Avoid walking on wet floors. Keep items that you use a lot in easy-to-reach places. If you need to reach something above you, use a strong step stool that has a grab bar. Keep electrical cords out of the way. Do not use floor polish or wax that makes floors slippery. If you must use wax, use non-skid floor wax. Do not have throw rugs and other things on the floor that can make you trip. What can I do with my stairs? Do not leave any items on the stairs. Make sure that there are handrails on both sides of the stairs and use them. Fix handrails that are broken or loose. Make sure that handrails are as long as the stairways. Check any carpeting to make sure that it is firmly attached to the stairs. Fix any carpet that is loose or worn. Avoid having throw rugs at the top or bottom of the stairs. If you do have throw rugs, attach them to the floor with carpet tape. Make sure that you have a light switch at the top of the stairs and the bottom of the stairs. If you do not have them, ask someone to add them for you. What else can I do to help prevent falls? Wear shoes that: Do not  have high heels. Have rubber bottoms. Are comfortable and fit you well. Are closed at the toe. Do not wear sandals. If you use a stepladder: Make sure that it is fully opened. Do not climb a closed stepladder. Make sure that both sides of the stepladder are locked into place. Ask someone to hold it for you, if possible. Clearly mark and make sure that you can see: Any grab bars or handrails. First and last steps. Where the edge of each step is. Use tools that help you move around (mobility aids) if they are needed. These include: Canes. Walkers. Scooters. Crutches. Turn on the lights when you go into a dark area. Replace any light bulbs as soon as they burn out. Set up your furniture so you have a clear path. Avoid moving your furniture around. If any of your floors are uneven, fix them. If there are any pets around you, be aware of where they are. Review your medicines with your doctor. Some medicines can make you feel dizzy. This can increase your chance of falling. Ask your doctor what other things that you can do to help prevent falls. This information is not intended to replace advice given to you by your health care provider. Make sure you discuss any questions you have with your health care provider. Document Released: 08/03/2009 Document Revised: 03/14/2016 Document Reviewed: 11/11/2014 Elsevier Interactive Patient Education  2017 [employer].

## 2022-11-26 NOTE — Progress Notes (Signed)
Subjective:   Larry Williamson is a 72 y.o. male who presents for Medicare Annual/Subsequent preventive examination.  Review of Systems     Cardiac Risk Factors include: advanced age (>77mn, >>62women);male gender;dyslipidemia;hypertension;obesity (BMI >30kg/m2)     Objective:    Today's Vitals   11/26/22 0819  BP: 130/78  Pulse: 75  Temp: 97.8 F (36.6 C)  SpO2: 95%  Weight: 229 lb (103.9 kg)   Body mass index is 33.82 kg/m.     11/26/2022    8:28 AM 11/15/2021    2:33 PM 10/11/2021    2:44 PM 01/30/2021    9:21 PM 01/30/2021    6:09 PM 01/03/2020   12:11 PM 07/09/2018    8:31 AM  Advanced Directives  Does Patient Have a Medical Advance Directive? Yes Yes No Yes Yes Yes Yes  Type of AParamedicof AFrench LickLiving will Healthcare Power of [place] will [place] will Living will;Healthcare Power of Attorney   Does patient want to make changes to medical advance directive?    No - Patient declined  No - Patient declined   Copy of [place] Chart? No - copy requested No - copy requested  No - copy requested  No - copy requested     Current Medications (verified) Outpatient Encounter Medications as of 11/26/2022  Medication Sig   amLODipine (NORVASC) 5 MG tablet TAKE 1 TABLET(2.5 MG) BY MOUTH DAILY   cetirizine (ZYRTEC) 10 MG tablet Take 10 mg by mouth daily.   Cyanocobalamin 2000 MCG/ML SOLN Inject 1,000 mcg as directed every 30 (thirty) days. Patient comes get his b12 injection every 3rd Thursday in the month.   gabapentin (NEURONTIN) 300 MG capsule TAKE 1 CAPSULE BY MOUTH TWICE A DAY   irbesartan-hydrochlorothiazide (AVALIDE) 300-12.5 MG tablet TAKE 1 TABLET BY MOUTH DAILY   MULTIPLE VITAMIN PO Take 1 tablet by mouth daily.   Omega-3 Fatty Acids (FISH OIL) 1000 MG CAPS Take 2 capsules by mouth daily.   omeprazole (PRILOSEC) 40 MG capsule TAKE 1 CAPSULE BY MOUTH  EVERY DAY   rosuvastatin (CRESTOR) 5 MG tablet Take 1 tablet (5 mg total) by mouth 2 (two) times a week.   tadalafil (CIALIS) 10 MG tablet Take 5 mg by mouth daily as needed for erectile dysfunction.   tizanidine (ZANAFLEX) 2 MG capsule TAKE 1 CAPSULE(2 MG) BY MOUTH TWICE DAILY AS NEEDED FOR MUSCLE SPASMS   No facility-administered encounter medications on file as of 11/26/2022.    Allergies (verified) Atorvastatin   History: Past Medical History:  Diagnosis Date   Adenomatous colon polyp 06/2006   ALLERGIC RHINITIS    Allergy    seasonal allergies   Anemia    hx of   ANEMIA DUE TO DIETARY IRON DEFICIENCY 05/28/2007   Due to giving regularly giving blood. Resolved with cutting in half.      COMPRESSION FRACTURE, THORACIC VERTEBRA 03/09/2008   2009, no chronic pain    Diverticulitis of colon    DIVERTICULOSIS, COLON 05/28/2007   Qualifier: Diagnosis of  By: JArnoldo MoraleMD, JBalinda Quails   GERD (gastroesophageal reflux disease)    on meds   Hyperlipidemia    on meds   Hypertension    on meds   Melanoma ([place] 02/23/2014   MM in situe, early, low mid back, exc   Squamous cell carcinoma in situ 11/10/2018   Top of right shoulder, CX3, 5FU  Squamous cell carcinoma of skin    SCC IN SITU TOP OF RIGHT SHOULDER TX CX3 5FU   Ulcer 1992   gastric   Past Surgical History:  Procedure Laterality Date   COLONOSCOPY  01/2017   hx polyps/tics/hems   COLONOSCOPY  2019   MS-[facility]-TICS/int hems/3 yr recall-   COLONOSCOPY W/ POLYPECTOMY  01/30/2021   Dr.Stark   COLONOSCOPY WITH PROPOFOL N/A 01/31/2021   Procedure: COLONOSCOPY WITH PROPOFOL;  Surgeon: Ladene Artist, MD;  Location: [facility];  Service: Endoscopy;  Laterality: N/A;   FOOT SURGERY Right    pain scraper several inches into foot   HEMOSTASIS CLIP PLACEMENT  01/31/2021   Procedure: HEMOSTASIS CLIP PLACEMENT;  Surgeon: Ladene Artist, MD;  Location: [facility];  Service: Endoscopy;;   POLYPECTOMY  2018   piecemeal polyps TAx 2    WISDOM TOOTH EXTRACTION     Family History  Problem Relation Age of Onset   Cancer Father        lung, former smoker, [place] lung in [place]   Dementia Mother    Hemochromatosis Brother    Colon cancer Neg Hx    Esophageal cancer Neg Hx    Rectal cancer Neg Hx    Stomach cancer Neg Hx    Colon polyps Neg Hx    Social History   Socioeconomic History   Marital status: Married    Spouse name: Not on file   Number of children: Not on file   Years of education: Not on file   Highest education level: Not on file  Occupational History   Not on file  Tobacco Use   Smoking status: Former    Packs/day: 1.00    Years: 15.00    Total pack years: 15.00    Types: Cigarettes    Quit date: 10/21/1978    Years since quitting: 44.1   Smokeless tobacco: Never  Vaping Use   Vaping Use: Never used  Substance and Sexual Activity   Alcohol use: Yes    Alcohol/week: 14.0 standard drinks of alcohol    Types: 14 Standard drinks or equivalent per week    Comment: 2 per day   Drug use: No   Sexual activity: Yes  Other Topics Concern   Not on file  Social History Narrative   Married (wife pt of Dr. Maudie Mercury). 5 children (2 by first wife, 3 stepkids), 11 granddaughters + 2 greatgrandsons.       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)      Hobbies: race cars, Designer, fashion/clothing signed on 03/15/10   Social Determinants of Health   Financial Resource Strain: [place]  (11/26/2022)   Overall Financial Resource Strain (CARDIA)    Difficulty of Paying Living Expenses: Not hard at all  Food Insecurity: No Food Insecurity (11/26/2022)   Hunger Vital Sign    Worried About Running Out of Food in the Last Year: Never true    [place] in the Last Year: Never true  Transportation Needs: No Transportation Needs (11/26/2022)   PRAPARE - Hydrologist (Medical): No    Lack of Transportation (Non-Medical): No  Physical Activity: Inactive  (11/26/2022)   Exercise Vital Sign    Days of Exercise per Week: 0 days    Minutes of Exercise per Session: 0 min  Stress: No Stress Concern Present (11/26/2022)   [place]  Questionnaire    Feeling of Stress : Not at all  Social Connections: Moderately Integrated (11/26/2022)   Social Connection and Isolation Panel [NHANES]    Frequency of Communication with Friends and Family: More than three times a week    Frequency of Social Gatherings with Friends and Family: More than three times a week    Attends Religious Services: More than 4 times per year    Active Member of [employer] or Organizations: No    Attends Music therapist: Never    Marital Status: Married    Tobacco Counseling Counseling given: Not Answered   Clinical Intake:  Pre-visit preparation completed: Yes  Pain : No/denies pain     BMI - recorded: 33.82 Nutritional Status: BMI > 30  Obese Nutritional Risks: None Diabetes: No  How often do you need to have someone help you when you read instructions, pamphlets, or other written materials from your doctor or pharmacy?: 1 - Never  Diabetic?no  Interpreter Needed?: No  Information entered by :: Charlott Rakes, [place]   Activities of Daily Living    11/26/2022    8:30 AM  In your present state of health, do you have any difficulty performing the following activities:  Hearing? 0  Vision? 0  Difficulty concentrating or making decisions? 0  Walking or climbing stairs? 0  Dressing or bathing? 0  Doing errands, shopping? 0  Preparing Food and eating ? N  Using the Toilet? N  In the past six months, have you accidently leaked urine? N  Do you have problems with loss of bowel control? N  Managing your Medications? N  Managing your Finances? N  Housekeeping or managing your Housekeeping? N    Patient Care Team: Marin Olp, MD as PCP - General (Family Medicine) Lavonna Monarch, MD (Inactive) as  Consulting Physician (Dermatology)  Indicate any recent Medical Services you may have received from other than [facility] providers in the past year (date may be approximate).     Assessment:   This is a routine wellness examination for Niraj.  Hearing/Vision screen Hearing Screening - Comments:: Pt denies any hearing issues  [facility] - Comments:: [facility]   Dietary issues and exercise activities discussed: Current Exercise Habits: The patient has a physically strenuous job, but has no regular exercise apart from work.   Goals Addressed             This Visit's Progress    Patient Stated       Stay healthy and active        Depression Screen    11/26/2022    8:26 AM 05/22/2022   11:02 AM 11/15/2021    2:31 PM 02/15/2021    3:39 PM 01/03/2020   12:12 PM 10/28/2019    8:06 AM 06/22/2019    4:15 PM  PHQ 2/9 Scores  PHQ - 2 Score 0 0 0 0 0 0 0  PHQ- 9 Score  0         Fall Risk    11/26/2022    8:29 AM 05/22/2022   11:02 AM 11/15/2021    2:34 PM 02/15/2021    3:39 PM 01/03/2020   12:12 PM  [place] in the past year? 0 0 0 0 0  Number falls in past yr: 0 0 0 0 0  Injury with Fall? 0 0 0 0 0  Risk for fall due to : Impaired vision  No Fall Risks Impaired vision    Follow up Falls prevention discussed Falls evaluation completed Falls prevention discussed  Education provided;Falls prevention discussed;Falls evaluation completed    FALL RISK PREVENTION PERTAINING TO THE HOME:  Any stairs in or around the home? Yes  If so, are there any without handrails? No  Home free of loose throw rugs in walkways, pet beds, electrical cords, etc? Yes  Adequate lighting in your home to reduce risk of falls? Yes   ASSISTIVE DEVICES UTILIZED TO PREVENT FALLS:  Life alert? No  Use of a cane, walker or w/c? No  Grab bars in the bathroom? No  Shower chair or bench in shower? No  Elevated toilet seat or a handicapped toilet? No   TIMED UP AND  GO:  Was the test performed? No .   Cognitive Function:        11/26/2022    8:30 AM 11/15/2021    2:40 PM 01/03/2020   12:12 PM  6CIT Screen  What Year? 0 points 0 points 0 points  What month? 0 points 0 points 0 points  What time? 0 points 0 points 0 points  Count back from 20 0 points 0 points 0 points  Months in reverse 0 points 0 points 0 points  Repeat phrase 2 points 0 points 0 points  Total Score 2 points 0 points 0 points    Immunizations Immunization History  Administered Date(s) Administered   Fluad Quad(high Dose 65+) 06/22/2019, 07/06/2020, 07/05/2021, 08/08/2022   Influenza Split 07/30/2011, 07/10/2012, 06/21/2016   Influenza Whole 08/04/2007, 07/21/2008, 07/18/2009, 07/16/2010   Influenza, High Dose Seasonal PF 07/14/2015, 07/16/2016, 07/17/2017, 07/21/2017, 07/09/2018   Influenza,inj,Quad PF,6+ Mos 07/05/2013, 08/03/2014, 07/13/2015   Influenza-Unspecified 06/22/2019, 07/06/2020, 06/21/2021, 08/08/2022   PFIZER(Purple Top)SARS-COV-2 Vaccination 11/21/2019, 12/12/2019, 10/03/2020   PNEUMOCOCCAL CONJUGATE-20 09/16/2022   Pneumococcal Conjugate-13 01/09/2016   Pneumococcal Polysaccharide-23 04/01/2017   Td 10/22/1999, 12/11/2009   Tdap 07/28/2015   Zoster Recombinat (Shingrix) 02/26/2020, 04/26/2020    TDAP status: Up to date  Flu Vaccine status: Up to date  Pneumococcal vaccine status: Up to date  Covid-19 vaccine status: Completed vaccines  Qualifies for Shingles Vaccine? Yes   Zostavax completed Yes   Shingrix Completed?: Yes  Screening Tests Health Maintenance  Topic Date Due   COVID-19 Vaccine (4 - 2023-24 season) 06/21/2022   Hepatitis C Screening  10/17/2098 (Originally 08/11/1969)   Medicare Annual Wellness (AWV)  11/27/2023   COLONOSCOPY (Pts 45-76yr Insurance coverage will need to be confirmed)  09/05/2024   DTaP/Tdap/Td (4 - Td or Tdap) 07/27/2025   Pneumonia Vaccine 72 Years old  Completed   INFLUENZA VACCINE  Completed   Zoster  Vaccines- Shingrix  Completed   HPV VACCINES  Aged Out    Health Maintenance  Health Maintenance Due  Topic Date Due   COVID-19 Vaccine (4 - 2023-24 season) 06/21/2022    Colorectal cancer screening: Type of screening: Colonoscopy. Completed 09/05/21. Repeat every 3 years   Additional Screening:  Hepatitis C Screening: does qualify;  Vision Screening: Recommended annual ophthalmology exams for early detection of glaucoma and other disorders of the eye. Is the patient up to date with their annual eye exam?  Yes  Who is the provider or what is the name of the office in which the patient attends [facility] exams[facility] If pt is not established with a provider, would they like to be referred to a provider to establish care? No .   Dental Screening: Recommended annual dental  exams for proper oral hygiene  Community Resource Referral / Chronic Care Management: CRR required this visit?  No   CCM required this visit?  No      Plan:     I have personally reviewed and noted the following in the patient's chart:   Medical and social history Use of alcohol, tobacco or illicit drugs  Current medications and supplements including opioid prescriptions. Patient is not currently taking opioid prescriptions. Functional ability and status Nutritional status Physical activity Advanced directives List of other physicians Hospitalizations, surgeries, and ER visits in previous 12 months Vitals Screenings to include cognitive, depression, and falls Referrals and appointments  In addition, I have reviewed and discussed with patient certain preventive protocols, quality metrics, and best practice recommendations. A written personalized care plan for preventive services as well as general preventive health recommendations were provided to patient.     Willette Brace, LPN   10/26/9676   Nurse Notes: none

## 2022-11-29 ENCOUNTER — Other Ambulatory Visit: Payer: Self-pay | Admitting: Family Medicine

## 2022-12-05 ENCOUNTER — Ambulatory Visit (INDEPENDENT_AMBULATORY_CARE_PROVIDER_SITE_OTHER): Payer: Medicare Other

## 2022-12-05 DIAGNOSIS — E538 Deficiency of other specified B group vitamins: Secondary | ICD-10-CM

## 2022-12-05 MED ORDER — CYANOCOBALAMIN 1000 MCG/ML IJ SOLN
1000.0000 ug | Freq: Once | INTRAMUSCULAR | Status: AC
  Administered 2022-12-05: 1000 ug via INTRAMUSCULAR

## 2022-12-05 NOTE — Progress Notes (Addendum)
Pt here for B12 injection for Dr. Yong Channel. Injection given in right deltoid. Pt tolerated well.

## 2022-12-25 ENCOUNTER — Telehealth: Payer: Self-pay | Admitting: Physical Medicine and Rehabilitation

## 2022-12-25 NOTE — Telephone Encounter (Signed)
Received call from patient's wife Phineas Semen advised patient need an appointment with Dr. Ernestina Patches for an injection in his back. The number to contact Phineas Semen is [phone]

## 2022-12-26 NOTE — Telephone Encounter (Signed)
Spoke with patient and informed him that because it's been over a year, he would need to be seen. Scheduled OV for 12/31/22

## 2022-12-30 ENCOUNTER — Telehealth: Payer: Self-pay | Admitting: Physical Medicine and Rehabilitation

## 2022-12-30 DIAGNOSIS — L821 Other seborrheic keratosis: Secondary | ICD-10-CM | POA: Diagnosis not present

## 2022-12-30 DIAGNOSIS — L814 Other melanin hyperpigmentation: Secondary | ICD-10-CM | POA: Diagnosis not present

## 2022-12-30 DIAGNOSIS — D1801 Hemangioma of skin and subcutaneous tissue: Secondary | ICD-10-CM | POA: Diagnosis not present

## 2022-12-30 DIAGNOSIS — L570 Actinic keratosis: Secondary | ICD-10-CM | POA: Diagnosis not present

## 2022-12-30 DIAGNOSIS — L578 Other skin changes due to chronic exposure to nonionizing radiation: Secondary | ICD-10-CM | POA: Diagnosis not present

## 2022-12-30 NOTE — Telephone Encounter (Signed)
Left detailed voicemail informing patient that appointment was cancelled and if he wants to reschedule he can call clinic

## 2022-12-30 NOTE — Telephone Encounter (Signed)
Patient states he wants to cancel his appointment for tomorrow.

## 2022-12-31 ENCOUNTER — Ambulatory Visit: Payer: Medicare Other | Admitting: Physical Medicine and Rehabilitation

## 2023-01-02 ENCOUNTER — Ambulatory Visit (INDEPENDENT_AMBULATORY_CARE_PROVIDER_SITE_OTHER): Payer: Medicare Other

## 2023-01-02 DIAGNOSIS — E538 Deficiency of other specified B group vitamins: Secondary | ICD-10-CM | POA: Diagnosis not present

## 2023-01-02 MED ORDER — CYANOCOBALAMIN 1000 MCG/ML IJ SOLN
1000.0000 ug | Freq: Once | INTRAMUSCULAR | Status: AC
  Administered 2023-01-02: 1000 ug via INTRAMUSCULAR

## 2023-01-02 NOTE — Progress Notes (Signed)
Patient was given a b12 injection in his left arm today. Patient tolerated injection well. Joanette Gula, CMA

## 2023-01-23 ENCOUNTER — Other Ambulatory Visit: Payer: Self-pay | Admitting: Family Medicine

## 2023-01-27 ENCOUNTER — Other Ambulatory Visit: Payer: Self-pay | Admitting: Family Medicine

## 2023-01-27 ENCOUNTER — Other Ambulatory Visit: Payer: Self-pay

## 2023-01-27 MED ORDER — ROSUVASTATIN CALCIUM 5 MG PO TABS: ORAL_TABLET | ORAL | 3 refills | Status: DC

## 2023-01-28 ENCOUNTER — Other Ambulatory Visit: Payer: Self-pay

## 2023-01-28 MED ORDER — ROSUVASTATIN CALCIUM 5 MG PO TABS: ORAL_TABLET | ORAL | 3 refills | Status: DC

## 2023-01-28 NOTE — Telephone Encounter (Signed)
Caller states patient's medication needs to be sent to new pharmacy, [facility] @ [address]

## 2023-02-06 ENCOUNTER — Ambulatory Visit (INDEPENDENT_AMBULATORY_CARE_PROVIDER_SITE_OTHER): Payer: Medicare Other | Admitting: *Deleted

## 2023-02-06 DIAGNOSIS — E538 Deficiency of other specified B group vitamins: Secondary | ICD-10-CM

## 2023-02-06 MED ORDER — CYANOCOBALAMIN 1000 MCG/ML IJ SOLN
1000.0000 ug | Freq: Once | INTRAMUSCULAR | Status: AC
Start: 2023-02-06 — End: 2023-02-06
  Administered 2023-02-06: 1000 ug via INTRAMUSCULAR

## 2023-02-06 NOTE — Progress Notes (Signed)
Per orders of Dr. Hunter, injection of B 12 given in left deltoid per patient preference by Aston Lieske S Christipher Rieger, CMA. Patient tolerated injection well. Patient reminded to schedule next injection. 

## 2023-02-22 ENCOUNTER — Other Ambulatory Visit: Payer: Self-pay | Admitting: Physical Medicine and Rehabilitation

## 2023-03-06 ENCOUNTER — Ambulatory Visit: Payer: Medicare Other

## 2023-03-11 ENCOUNTER — Ambulatory Visit (INDEPENDENT_AMBULATORY_CARE_PROVIDER_SITE_OTHER): Payer: Medicare Other

## 2023-03-11 DIAGNOSIS — E538 Deficiency of other specified B group vitamins: Secondary | ICD-10-CM

## 2023-03-11 MED ORDER — CYANOCOBALAMIN 1000 MCG/ML IJ SOLN
1000.0000 ug | Freq: Once | INTRAMUSCULAR | Status: AC
Start: 2023-03-11 — End: 2023-03-11
  Administered 2023-03-11: 1000 ug via INTRAMUSCULAR

## 2023-03-11 NOTE — Progress Notes (Addendum)
Pt here for B12 injection for Dr. Durene Cal, MD. Injection given in right deltoid.  Pt tolerated well.

## 2023-04-10 ENCOUNTER — Ambulatory Visit (INDEPENDENT_AMBULATORY_CARE_PROVIDER_SITE_OTHER): Payer: Medicare Other | Admitting: *Deleted

## 2023-04-10 DIAGNOSIS — E538 Deficiency of other specified B group vitamins: Secondary | ICD-10-CM | POA: Diagnosis not present

## 2023-04-10 MED ORDER — CYANOCOBALAMIN 1000 MCG/ML IJ SOLN
1000.0000 ug | Freq: Once | INTRAMUSCULAR | Status: AC
Start: 2023-04-10 — End: 2023-04-10
  Administered 2023-04-10: 1000 ug via INTRAMUSCULAR

## 2023-04-10 NOTE — Progress Notes (Signed)
Per orders of Dr. Hunter, injection of B 12 given in left deltoid per patient preference by Shaquilla Kehres S Billie Trager, CMA. Patient tolerated injection well. Patient reminded to schedule next injection. 

## 2023-04-22 ENCOUNTER — Other Ambulatory Visit: Payer: Self-pay | Admitting: Family Medicine

## 2023-05-08 ENCOUNTER — Ambulatory Visit: Payer: Medicare Other

## 2023-05-08 DIAGNOSIS — E538 Deficiency of other specified B group vitamins: Secondary | ICD-10-CM

## 2023-05-08 MED ORDER — CYANOCOBALAMIN 1000 MCG/ML IJ SOLN
1000.0000 ug | Freq: Once | INTRAMUSCULAR | Status: AC
Start: 2023-05-08 — End: 2023-05-08
  Administered 2023-05-08: 1000 ug via INTRAMUSCULAR

## 2023-05-08 NOTE — Progress Notes (Signed)
Pt tolerated b12 well. 

## 2023-05-21 ENCOUNTER — Encounter (INDEPENDENT_AMBULATORY_CARE_PROVIDER_SITE_OTHER): Payer: Self-pay

## 2023-06-05 ENCOUNTER — Encounter (INDEPENDENT_AMBULATORY_CARE_PROVIDER_SITE_OTHER): Payer: Self-pay

## 2023-06-05 ENCOUNTER — Ambulatory Visit (INDEPENDENT_AMBULATORY_CARE_PROVIDER_SITE_OTHER): Payer: Medicare Other

## 2023-06-05 DIAGNOSIS — E538 Deficiency of other specified B group vitamins: Secondary | ICD-10-CM | POA: Diagnosis not present

## 2023-06-05 MED ORDER — CYANOCOBALAMIN 1000 MCG/ML IJ SOLN
1000.0000 ug | Freq: Once | INTRAMUSCULAR | Status: AC
Start: 2023-06-05 — End: 2023-06-05
  Administered 2023-06-05: 1000 ug via INTRAMUSCULAR

## 2023-06-05 NOTE — Progress Notes (Signed)
Pt tolerated b12 injection well. 

## 2023-06-23 ENCOUNTER — Other Ambulatory Visit: Payer: Self-pay | Admitting: Physical Medicine and Rehabilitation

## 2023-07-03 ENCOUNTER — Ambulatory Visit (INDEPENDENT_AMBULATORY_CARE_PROVIDER_SITE_OTHER): Payer: Medicare Other

## 2023-07-03 DIAGNOSIS — E538 Deficiency of other specified B group vitamins: Secondary | ICD-10-CM | POA: Diagnosis not present

## 2023-07-03 DIAGNOSIS — Z23 Encounter for immunization: Secondary | ICD-10-CM
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
## 2023-07-03 MED ORDER — CYANOCOBALAMIN 1000 MCG/ML IJ SOLN
1000.0000 ug | Freq: Once | INTRAMUSCULAR | Status: AC
Start: 2023-07-03 — End: 2023-07-03
  Administered 2023-07-03: 1000 ug via INTRAMUSCULAR

## 2023-07-03 NOTE — Progress Notes (Signed)
Pt tolerated b12 injection well. 

## 2023-07-15 DIAGNOSIS — D229 Melanocytic nevi, unspecified: Secondary | ICD-10-CM | POA: Diagnosis not present

## 2023-07-15 DIAGNOSIS — L821 Other seborrheic keratosis: Secondary | ICD-10-CM | POA: Diagnosis not present

## 2023-07-15 DIAGNOSIS — Z85828 Personal history of other malignant neoplasm of skin: Secondary | ICD-10-CM | POA: Diagnosis not present

## 2023-07-15 DIAGNOSIS — L814 Other melanin hyperpigmentation: Secondary | ICD-10-CM | POA: Diagnosis not present

## 2023-07-15 DIAGNOSIS — L578 Other skin changes due to chronic exposure to nonionizing radiation: Secondary | ICD-10-CM | POA: Diagnosis not present

## 2023-07-15 DIAGNOSIS — L570 Actinic keratosis: Secondary | ICD-10-CM | POA: Diagnosis not present

## 2023-07-15 DIAGNOSIS — D1801 Hemangioma of skin and subcutaneous tissue: Secondary | ICD-10-CM | POA: Diagnosis not present

## 2023-07-18 ENCOUNTER — Other Ambulatory Visit: Payer: Self-pay | Admitting: Family Medicine

## 2023-08-07 ENCOUNTER — Ambulatory Visit: Payer: Medicare Other

## 2023-08-07 DIAGNOSIS — E538 Deficiency of other specified B group vitamins: Secondary | ICD-10-CM

## 2023-08-07 MED ORDER — CYANOCOBALAMIN 1000 MCG/ML IJ SOLN
1000.0000 ug | Freq: Once | INTRAMUSCULAR | Status: AC
Start: 2023-08-07 — End: 2023-08-07
  Administered 2023-08-07: 1000 ug via INTRAMUSCULAR

## 2023-08-07 NOTE — Progress Notes (Signed)
Patient is in office today for a nurse visit for B12 Injection, per PCP's order. Patient Injection was given in the  Right deltoid. Patient tolerated injection well.

## 2023-09-04 ENCOUNTER — Ambulatory Visit: Payer: Medicare Other

## 2023-09-04 DIAGNOSIS — E538 Deficiency of other specified B group vitamins: Secondary | ICD-10-CM | POA: Diagnosis not present

## 2023-09-04 MED ORDER — CYANOCOBALAMIN 1000 MCG/ML IJ SOLN
1000.0000 ug | Freq: Once | INTRAMUSCULAR | Status: AC
Start: 2023-09-04 — End: 2023-09-04
  Administered 2023-09-04: 1000 ug via INTRAMUSCULAR

## 2023-09-04 NOTE — Progress Notes (Signed)
Patient is in office today for a nurse visit for B12 Injection. Patient Injection was given in the  Left deltoid. Patient tolerated injection well.

## 2023-10-02 ENCOUNTER — Ambulatory Visit: Payer: Medicare Other

## 2023-10-02 DIAGNOSIS — E538 Deficiency of other specified B group vitamins: Secondary | ICD-10-CM

## 2023-10-02 MED ORDER — CYANOCOBALAMIN 1000 MCG/ML IJ SOLN
1000.0000 ug | Freq: Once | INTRAMUSCULAR | Status: AC
Start: 2023-10-02 — End: 2023-10-02
  Administered 2023-10-02: 1000 ug via INTRAMUSCULAR

## 2023-10-02 NOTE — Progress Notes (Signed)
Patient is in office today for a nurse visit for B12 Injection, per PCP's order. Patient Injection was given in the  Left deltoid. Patient tolerated injection well.

## 2023-10-21 ENCOUNTER — Other Ambulatory Visit: Payer: Self-pay | Admitting: Physical Medicine and Rehabilitation

## 2023-11-06 ENCOUNTER — Ambulatory Visit: Payer: Medicare Other

## 2023-11-06 DIAGNOSIS — E538 Deficiency of other specified B group vitamins: Secondary | ICD-10-CM

## 2023-11-06 MED ORDER — CYANOCOBALAMIN 1000 MCG/ML IJ SOLN
1000.0000 ug | Freq: Once | INTRAMUSCULAR | Status: AC
Start: 2023-11-06 — End: 2023-11-06
  Administered 2023-11-06: 1000 ug via INTRAMUSCULAR

## 2023-11-06 NOTE — Progress Notes (Signed)
Patient is in office today for a nurse visit for B12 Injection.

## 2023-11-30 ENCOUNTER — Other Ambulatory Visit: Payer: Self-pay | Admitting: Family Medicine

## 2023-12-09 ENCOUNTER — Ambulatory Visit (INDEPENDENT_AMBULATORY_CARE_PROVIDER_SITE_OTHER): Payer: Medicare Other

## 2023-12-09 VITALS — BP 130/68 | HR 70 | Temp 97.9°F | Ht 69.5 in | Wt 221.0 lb

## 2023-12-09 DIAGNOSIS — Z0000 Encounter for general adult medical examination without abnormal findings: Secondary | ICD-10-CM | POA: Diagnosis not present

## 2023-12-09 DIAGNOSIS — E538 Deficiency of other specified B group vitamins: Secondary | ICD-10-CM | POA: Diagnosis not present

## 2023-12-09 MED ORDER — CYANOCOBALAMIN 1000 MCG/ML IJ SOLN
1000.0000 ug | Freq: Once | INTRAMUSCULAR | Status: AC
  Administered 2023-12-09: 1000 ug via INTRAMUSCULAR

## 2023-12-09 NOTE — Patient Instructions (Signed)
Mr. Sheeler , Thank you for taking time to come for your Medicare Wellness Visit. I appreciate your ongoing commitment to your health goals. Please review the following plan we discussed and let me know if I can assist you in the future.   Referrals/Orders/Follow-Ups/Clinician Recommendations: maintain health and activity   This is a list of the screening recommended for you and due dates:  Health Maintenance  Topic Date Due   COVID-19 Vaccine (4 - 2024-25 season) 06/22/2023   Hepatitis C Screening  10/17/2098*   Colon Cancer Screening  09/05/2024   Medicare Annual Wellness Visit  12/08/2024   DTaP/Tdap/Td vaccine (4 - Td or Tdap) 07/27/2025   Pneumonia Vaccine  Completed   Flu Shot  Completed   Zoster (Shingles) Vaccine  Completed   HPV Vaccine  Aged Out  *Topic was postponed. The date shown is not the original due date.    Advanced directives: (Copy Requested) Please bring a copy of your health care power of attorney and living will to the office to be added to your chart at your convenience.  Next Medicare Annual Wellness Visit scheduled for next year: Yes

## 2023-12-09 NOTE — Progress Notes (Signed)
 Patient is in office today for a nurse visit for B12 Injection. Patient Injection was given in the  Left deltoid. Patient tolerated injection well.

## 2023-12-09 NOTE — Progress Notes (Addendum)
Subjective:   Larry Williamson is a 73 y.o. male who presents for Medicare Annual/Subsequent preventive examination.  Visit Complete: In person   Cardiac Risk Factors include: advanced age (>21men, >19 women);dyslipidemia;hypertension;male gender;obesity (BMI >30kg/m2)     Objective:    Today's Vitals   12/09/23 0834 12/09/23 0851  BP: (!) 150/70 130/68  Pulse: 70   Temp: 97.9 F (36.6 C)   SpO2: 95%   Weight: 221 lb (100.2 kg)   Height: 5' 9.5" (1.765 m)    Body mass index is 32.17 kg/m.     12/09/2023    8:44 AM 11/26/2022    8:28 AM 11/15/2021    2:33 PM 10/11/2021    2:44 PM 01/30/2021    9:21 PM 01/30/2021    6:09 PM 01/03/2020   12:11 PM  Advanced Directives  Does Patient Have a Medical Advance Directive? Yes Yes Yes No Yes Yes Yes  Type of Estate agent of [place];Living will Healthcare Power of [place];Living will Healthcare Power of [employer] of [place];Living will Healthcare Power of [place];Living will Living will;Healthcare Power of Attorney  Does patient want to make changes to medical advance directive?     No - Patient declined  No - Patient declined  Copy of Healthcare Power of Attorney in Chart? No - copy requested No - copy requested No - copy requested  No - copy requested  No - copy requested    Current Medications (verified) Outpatient Encounter Medications as of 12/09/2023  Medication Sig   amLODipine (NORVASC) 5 MG tablet TAKE 1 TABLET BY MOUTH EVERY DAY   aspirin 81 MG chewable tablet Chew by mouth.   cetirizine (ZYRTEC) 10 MG tablet Take 10 mg by mouth daily.   Cyanocobalamin 2000 MCG/ML SOLN Inject 1,000 mcg as directed every 30 (thirty) days. Patient comes get his b12 injection every 3rd Thursday in the month.   furosemide (LASIX) 20 MG tablet Take by mouth.   gabapentin (NEURONTIN) 300 MG capsule TAKE 1 CAPSULE BY MOUTH TWICE A DAY   irbesartan-hydrochlorothiazide (AVALIDE) 300-12.5 MG tablet TAKE 1  TABLET BY MOUTH EVERY DAY   MULTIPLE VITAMIN PO Take 1 tablet by mouth daily.   Omega-3 Fatty Acids (FISH OIL) 1000 MG CAPS Take 2 capsules by mouth daily.   omeprazole (PRILOSEC) 40 MG capsule TAKE 1 CAPSULE BY MOUTH EVERY DAY   rosuvastatin (CRESTOR) 5 MG tablet TAKE 1 TABLET BY MOUTH TWICE WEEKLY   tadalafil (CIALIS) 10 MG tablet Take 5 mg by mouth daily as needed for erectile dysfunction.   tizanidine (ZANAFLEX) 2 MG capsule TAKE 1 CAPSULE(2 MG) BY MOUTH TWICE DAILY AS NEEDED FOR MUSCLE SPASMS   No facility-administered encounter medications on file as of 12/09/2023.    Allergies (verified) Atorvastatin   History: Past Medical History:  Diagnosis Date   Adenomatous colon polyp 06/2006   ALLERGIC RHINITIS    Allergy    seasonal allergies   Anemia    hx of   ANEMIA DUE TO DIETARY IRON DEFICIENCY 05/28/2007   Due to giving regularly giving blood. Resolved with cutting in half.      COMPRESSION FRACTURE, THORACIC VERTEBRA 03/09/2008   2009, no chronic pain    Diverticulitis of colon    DIVERTICULOSIS, COLON 05/28/2007   Qualifier: Diagnosis of  By: Lovell Sheehan MD, Balinda Quails    GERD (gastroesophageal reflux disease)    on meds   Hyperlipidemia    on meds   Hypertension  on meds   Melanoma (HCC) 02/23/2014   MM in situe, early, low mid back, exc   Squamous cell carcinoma in situ 11/10/2018   Top of right shoulder, CX3, 5FU   Squamous cell carcinoma of skin    SCC IN SITU TOP OF RIGHT SHOULDER TX CX3 5FU   Ulcer 1992   gastric   Past Surgical History:  Procedure Laterality Date   COLONOSCOPY  01/2017   hx polyps/tics/hems   COLONOSCOPY  2019   [facility]-TICS/int hems/3 yr recall-   COLONOSCOPY W/ POLYPECTOMY  01/30/2021   Dr.Stark   COLONOSCOPY WITH PROPOFOL N/A 01/31/2021   Procedure: COLONOSCOPY WITH PROPOFOL;  Surgeon: Meryl Dare, MD;  Location: [facility];  Service: Endoscopy;  Laterality: N/A;   FOOT SURGERY Right    pain scraper several inches into foot    HEMOSTASIS CLIP PLACEMENT  01/31/2021   Procedure: HEMOSTASIS CLIP PLACEMENT;  Surgeon: Meryl Dare, MD;  Location: [facility];  Service: Endoscopy;;   POLYPECTOMY  2018   piecemeal polyps TAx 2   WISDOM TOOTH EXTRACTION     Family History  Problem Relation Age of Onset   Cancer Father        lung, former smoker, [place] lung [place]   Dementia Mother    Hemochromatosis Brother    Colon cancer Neg Hx    Esophageal cancer Neg Hx    Rectal cancer Neg Hx    Stomach cancer Neg Hx    Colon polyps Neg Hx    Social History   Socioeconomic History   Marital status: Married    Spouse name: Not on file   Number of children: Not on file   Years of education: Not on file   Highest education level: Not on file  Occupational History   Not on file  Tobacco Use   Smoking status: Former    Current packs/day: 0.00    Average packs/day: 1 pack/day for 15.0 years (15.0 ttl pk-yrs)    Types: Cigarettes    Start date: 10/22/1963    Quit date: 10/21/1978    Years since quitting: 45.1   Smokeless tobacco: Never  Vaping Use   Vaping status: Never Used  Substance and Sexual Activity   Alcohol use: Yes    Alcohol/week: 14.0 standard drinks of alcohol    Types: 14 Standard drinks or equivalent per week    Comment: 2 per day   Drug use: No   Sexual activity: Yes  Other Topics Concern   Not on file  Social History Narrative   Married (wife pt of Dr. Selena Batten). 5 children (2 by first wife, 3 stepkids), 11 granddaughters + 2 greatgrandsons.       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)      Hobbies: race cars, Risk manager signed on 03/15/10   Social Drivers of [employer]: Low Risk  (12/09/2023)   Overall Financial Resource Strain (CARDIA)    Difficulty of Paying Living Expenses: Not hard at all  Food Insecurity: No Food Insecurity (12/09/2023)   Hunger Vital Sign    Worried About Running Out of Food in the Last Year:  Never true    Ran Out of Food in the Last Year: Never true  Transportation Needs: No Transportation Needs (12/09/2023)   PRAPARE - Administrator, Civil Service (Medical): No    Lack of Transportation (Non-Medical): No  Physical Activity: Sufficiently  Active (12/09/2023)   Exercise Vital Sign    Days of Exercise per Week: 6 days    Minutes of Exercise per Session: 90 min  Stress: No Stress Concern Present (12/09/2023)   [employer] of Occupational Health - Occupational Stress Questionnaire    Feeling of Stress : Not at all  Social Connections: Socially Integrated (12/09/2023)   Social Connection and Isolation Panel [NHANES]    Frequency of Communication with Friends and Family: More than three times a week    Frequency of Social Gatherings with Friends and Family: More than three times a week    Attends Religious Services: More than 4 times per year    Active Member of [employer] or Organizations: Yes    Attends Banker Meetings: 1 to 4 times per year    Marital Status: Married    Tobacco Counseling Counseling given: Not Answered   Clinical Intake:  Pre-visit preparation completed: Yes  Pain : No/denies pain     BMI - recorded: 32.17 Nutritional Status: BMI > 30  Obese Nutritional Risks: None Diabetes: No  How often do you need to have someone help you when you read instructions, pamphlets, or other written materials from your doctor or pharmacy?: 1 - Never  Interpreter Needed?: No  Information entered by :: Lanier Ensign, LPN   Activities of Daily Living    12/09/2023    8:40 AM  In your present state of health, do you have any difficulty performing the following activities:  Hearing? 0  Vision? 0  Difficulty concentrating or making decisions? 0  Walking or climbing stairs? 0  Dressing or bathing? 0  Doing errands, shopping? 0  Preparing Food and eating ? N  Using the Toilet? N  In the past six months, have you accidently leaked urine?  N  Do you have problems with loss of bowel control? N  Managing your Medications? N  Managing your Finances? N  Housekeeping or managing your Housekeeping? N    Patient Care Team: Shelva Majestic, MD as PCP - General (Family Medicine) Janalyn Harder, MD (Inactive) as Consulting Physician (Dermatology)  Indicate any recent Medical Services you may have received from other than [facility] providers in the past year (date may be approximate).     Assessment:   This is a routine wellness examination for Larry Williamson.  Hearing/Vision screen Hearing Screening - Comments:: Pt denies any hearing issues  [facility]ening - Comments:: [facility]    Goals Addressed             This Visit's Progress    Patient Stated       Continue to exercise        Depression Screen    12/09/2023    8:44 AM 11/26/2022    8:26 AM 05/22/2022   11:02 AM 11/15/2021    2:31 PM 02/15/2021    3:39 PM 01/03/2020   12:12 PM 10/28/2019    8:06 AM  PHQ 2/9 Scores  PHQ - 2 Score 0 0 0 0 0 0 0  PHQ- 9 Score   0        Fall Risk    12/09/2023    8:46 AM 11/26/2022    8:29 AM 05/22/2022   11:02 AM 11/15/2021    2:34 PM 02/15/2021    3:39 PM  Fall Risk   Falls in the past year? 0 0 0 0 0  Number falls in past yr: 0  0 0 0 0  Injury with Fall? 0 0 0 0 0  Risk for fall due to : No Fall Risks Impaired vision No Fall Risks Impaired vision   Follow up Falls prevention discussed;Falls evaluation completed Falls prevention discussed Falls evaluation completed Falls prevention discussed     MEDICARE RISK AT HOME: Medicare Risk at Home Any stairs in or around the home?: Yes If so, are there any without handrails?: No Home free of loose throw rugs in walkways, pet beds, electrical cords, etc?: Yes Adequate lighting in your home to reduce risk of falls?: Yes Life alert?: No Use of a cane, walker or w/c?: No Grab bars in the bathroom?: Yes Shower chair or bench in shower?: No Elevated toilet  seat or a handicapped toilet?: No  TIMED UP AND GO:  Was the test performed?  No    Cognitive Function:    07/09/2018    8:35 AM  MMSE - Mini Mental State Exam  Not completed: --        12/09/2023    8:46 AM 11/26/2022    8:30 AM 11/15/2021    2:40 PM 01/03/2020   12:12 PM  6CIT Screen  What Year? 0 points 0 points 0 points 0 points  What month? 0 points 0 points 0 points 0 points  What time? 0 points 0 points 0 points 0 points  Count back from 20 0 points 0 points 0 points 0 points  Months in reverse 0 points 0 points 0 points 0 points  Repeat phrase 0 points 2 points 0 points 0 points  Total Score 0 points 2 points 0 points 0 points    Immunizations Immunization History  Administered Date(s) Administered   Fluad Quad(high Dose 65+) 06/22/2019, 07/06/2020, 07/05/2021, 08/08/2022   Fluad Trivalent(High Dose 65+) 07/03/2023   Influenza Split 07/30/2011, 07/10/2012, 06/21/2016   Influenza Whole 08/04/2007, 07/21/2008, 07/18/2009, 07/16/2010   Influenza, High Dose Seasonal PF 07/14/2015, 07/16/2016, 07/17/2017, 07/21/2017, 07/09/2018   Influenza,inj,Quad PF,6+ Mos 07/05/2013, 08/03/2014, 07/13/2015   Influenza-Unspecified 06/22/2019, 07/06/2020, 06/21/2021, 08/08/2022   PFIZER(Purple Top)SARS-COV-2 Vaccination 11/21/2019, 12/12/2019, 10/03/2020   PNEUMOCOCCAL CONJUGATE-20 09/16/2022   Pneumococcal Conjugate-13 01/09/2016   Pneumococcal Polysaccharide-23 04/01/2017   Rsv, Bivalent, Protein Subunit Rsvpref,pf Verdis Frederickson) 12/19/2022   Td 10/22/1999, 12/11/2009   Tdap 07/28/2015   Zoster Recombinant(Shingrix) 02/26/2020, 04/26/2020    TDAP status: Up to date  Flu Vaccine status: Up to date  Pneumococcal vaccine status: Up to date  Covid-19 vaccine status: Information provided on how to obtain vaccines.   Qualifies for Shingles Vaccine? Yes   Zostavax completed Yes   Shingrix Completed?: Yes  Screening Tests Health Maintenance  Topic Date Due   COVID-19 Vaccine (4 -  2024-25 season) 06/22/2023   Hepatitis C Screening  10/17/2098 (Originally 08/11/1969)   Colonoscopy  09/05/2024   Medicare Annual Wellness (AWV)  12/08/2024   DTaP/Tdap/Td (4 - Td or Tdap) 07/27/2025   Pneumonia Vaccine 67+ Years old  Completed   INFLUENZA VACCINE  Completed   Zoster Vaccines- Shingrix  Completed   HPV VACCINES  Aged Out    Health Maintenance  Health Maintenance Due  Topic Date Due   COVID-19 Vaccine (4 - 2024-25 season) 06/22/2023    Colorectal cancer screening: Type of screening: Colonoscopy. Completed 09/05/21. Repeat every 3 years   Additional Screening:  Hepatitis C Screening: does qualify;  Vision Screening: Recommended annual ophthalmology exams for early detection of glaucoma and other disorders of the eye. Is the patient up to  date with their annual eye exam?  Yes  Who is the provider or what is the [facility] of the office in which the patient [facility]  If pt is not established with a provider, would they like to be referred to a provider to establish care? No .   Dental Screening: Recommended annual dental exams for proper oral hygiene   Community Resource Referral / Chronic Care Management: CRR required this visit?  No   CCM required this visit?  No     Plan:     I have personally reviewed and noted the following in the patient's chart:   Medical and social history Use of alcohol, tobacco or illicit drugs  Current medications and supplements including opioid prescriptions. Patient is not currently taking opioid prescriptions. Functional ability and status Nutritional status Physical activity Advanced directives List of other physicians Hospitalizations, surgeries, and ER visits in previous 12 months Vitals Screenings to include cognitive, depression, and falls Referrals and appointments  In addition, I have reviewed and discussed with patient certain preventive protocols, quality metrics, and best practice  recommendations. A written personalized care plan for preventive services as well as general preventive health recommendations were provided to patient.     Marzella Schlein, LPN   1/61/0960   After Visit Summary: (MyChart) Due to this being a telephonic visit, the after visit summary with patients personalized plan was offered to patient via MyChart   Nurse Notes: none

## 2023-12-11 ENCOUNTER — Ambulatory Visit: Payer: Medicare Other

## 2023-12-16 ENCOUNTER — Telehealth: Payer: Self-pay | Admitting: Physical Medicine and Rehabilitation

## 2023-12-16 NOTE — Telephone Encounter (Signed)
 Patient's wife Larry Williamson called advised patient need to schedule an appointment with Dr. Alvester Morin for his left side lower back and leg. The number to contact Larry Williamson is [phone]

## 2023-12-24 ENCOUNTER — Ambulatory Visit: Payer: Medicare Other | Admitting: Physical Medicine and Rehabilitation

## 2023-12-24 ENCOUNTER — Encounter: Payer: Self-pay | Admitting: Physical Medicine and Rehabilitation

## 2023-12-24 VITALS — BP 153/79 | HR 66

## 2023-12-24 DIAGNOSIS — M48061 Spinal stenosis, lumbar region without neurogenic claudication: Secondary | ICD-10-CM

## 2023-12-24 DIAGNOSIS — M5442 Lumbago with sciatica, left side: Secondary | ICD-10-CM | POA: Diagnosis not present

## 2023-12-24 DIAGNOSIS — G8929 Other chronic pain: Secondary | ICD-10-CM

## 2023-12-24 DIAGNOSIS — M5416 Radiculopathy, lumbar region: Secondary | ICD-10-CM | POA: Diagnosis not present

## 2023-12-24 DIAGNOSIS — M5441 Lumbago with sciatica, right side: Secondary | ICD-10-CM | POA: Diagnosis not present

## 2023-12-24 NOTE — Progress Notes (Signed)
 Pain Scale   Average Pain 7

## 2023-12-24 NOTE — Progress Notes (Signed)
 Larry Williamson - 73 y.o. male MRN [id number]  Date of birth: 08-Apr-1951  Office Visit Note: Visit Date: 12/24/2023 PCP: Shelva Majestic, MD Referred by: Shelva Majestic, MD  Subjective: Chief Complaint  Patient presents with   Lower Back - Pain   HPI: Larry Williamson is a 73 y.o. male who comes in today for evaluation of chronic, worsening and severe bilateral lower back pain radiating to buttocks and posterior thighs. Paresthesias noted to both upper legs. Pain ongoing for several years, worsened over the last 2 months. His pain worsens when walking on concrete floors and sitting on his motorcycle. He describes pain as burning sensation, currently rates as 7 out of 10. Some relief of pain with home exercise regimen, rest and use of medications. Some relief of pain with Voltaren gel and Tylenol. No history of formal physical therapy. He is fairly active, does exercise at [facility] every morning. Lumbar MRI imaging from 2022 does show multi level degenerative changes. There is lateral recess and foraminal stenosis at L4-L5. No high grade spinal canal stenosis noted. He underwent right L4 transforaminal epidural steroid injection in our office in 2022, he reports greater than 50% relief of pain for over 1 year with this procedure. Patient denies focal weakness. No recent trauma or falls.      Review of Systems  Musculoskeletal:  Positive for back pain.  Neurological:  Positive for tingling. Negative for focal weakness and weakness.  All other systems reviewed and are negative.  Otherwise per HPI.  Assessment & Plan: Visit Diagnoses:    ICD-10-CM   1. Chronic bilateral low back pain with bilateral sciatica  M54.42 Ambulatory referral to Physical Medicine Rehab   M54.41    G89.29     2. Lumbar radiculopathy  M54.16 Ambulatory referral to Physical Medicine Rehab    3. Stenosis of lateral recess of lumbar spine  M48.061 Ambulatory referral to Physical Medicine Rehab    4.  Foraminal stenosis of lumbar region  M48.061 Ambulatory referral to Physical Medicine Rehab       Plan: Findings:  Chronic, worsening and severe bilateral lower back pain radiating to buttocks and posterior thighs. Paresthesias to both upper legs. Patient continues to have severe pain despite good conservative therapies such as home exercise regimen, rest and use of medications. Patients clinical presentation and exam are complex, his pain does not specifically fit with dermatomal pattern. He did get significant relief with transforaminal injection at the level of L4 in 2022. We discussed treatment plan in detail today. Next step is to perform bilateral L4 transforaminal epidural steroid injection under fluoroscopic guidance. If good relief of pain with injection we can repeat this procedure infrequently as needed. Should his pain persist post injection we discussed possibility of obtaining new lumbar MRI imaging. Could also look at medication management, I do think he would do well with Celebrex. Patient has no questions at this time. No red flag symptoms noted upon exam today.     Meds & Orders: No orders of the defined types were placed in this encounter.   Orders Placed This Encounter  Procedures   Ambulatory referral to Physical Medicine Rehab    Follow-up: Return for Bilateral L4 transforaminal epidural steroid injection.   Procedures: No procedures performed      Clinical History: Narrative & Impression CLINICAL DATA:  Low back pain   EXAM: MRI LUMBAR SPINE WITHOUT CONTRAST   TECHNIQUE: Multiplanar, multisequence MR imaging of the lumbar spine was performed.  No intravenous contrast was administered.   COMPARISON:  2015   FINDINGS: Segmentation:  Standard.   Alignment:  Preserved and stable.   Vertebrae: Stable vertebral body heights. There is no substantial marrow edema. There is no suspicious osseous lesion.   Conus medullaris and cauda equina: Conus extends to the L1  level. Conus and cauda equina appear normal.   Paraspinal and other soft tissues: Unremarkable.   Disc levels:   L1-L2:  Disc bulge.  No canal or foraminal stenosis.   L2-L3:  Disc bulge.  No canal stenosis.  Minor foraminal stenosis.   L3-L4:  Disc bulge.  No canal stenosis.  Minor foraminal stenosis.   L4-L5: Disc bulge with endplate osteophytic ridging and superimposed central protrusion. Facet arthropathy with ligamentum flavum infolding. Minor canal stenosis. Narrowing of the subarticular recesses. Increased moderate to marked right foraminal stenosis. Mild left foraminal stenosis.   L5-S1: Disc bulge with endplate osteophytic ridging. Facet arthropathy. No canal stenosis. Moderate foraminal stenosis, right greater than left.   IMPRESSION: Multilevel degenerative changes as detailed above. There is increased right foraminal stenosis at L4-L5. Otherwise no substantial progression since 2015.     Electronically Signed   By: Guadlupe Spanish M.D.   On: 11/08/2020 08:29   He reports that he quit smoking about 45 years ago. His smoking use included cigarettes. He started smoking about 60 years ago. He has a 15 pack-year smoking history. He has never used smokeless tobacco. No results for input(s): "HGBA1C", "LABURIC" in the last 8760 hours.  Objective:  VS:  HT:    WT:   BMI:     BP:(!) 153/79  HR:66bpm  TEMP: ( )  RESP:  Physical Exam Vitals and nursing note reviewed.  HENT:     Head: Normocephalic and atraumatic.     Right Ear: External ear normal.     Left Ear: External ear normal.     Nose: Nose normal.     Mouth/Throat:     Mouth: Mucous membranes are moist.  Eyes:     Extraocular Movements: Extraocular movements intact.  Cardiovascular:     Rate and Rhythm: Normal rate.     Pulses: Normal pulses.  Pulmonary:     Effort: Pulmonary effort is normal.  Abdominal:     General: Abdomen is flat. There is no distension.  Musculoskeletal:        General:  Tenderness present.     Cervical back: Normal range of motion.     Comments: Patient rises from seated position to standing without difficulty. Good lumbar range of motion. No pain noted with facet loading. 5/5 strength noted with bilateral hip flexion, knee flexion/extension, ankle dorsiflexion/plantarflexion and EHL. No clonus noted bilaterally. No pain upon palpation of greater trochanters. No pain with internal/external rotation of bilateral hips. Sensation intact bilaterally. Negative slump test bilaterally. Ambulates without aid, gait steady.     Skin:    General: Skin is warm and dry.     Capillary Refill: Capillary refill takes less than 2 seconds.  Neurological:     General: No focal deficit present.     Mental Status: He is alert and oriented to person, place, and time.  Psychiatric:        Mood and Affect: Mood normal.        Behavior: Behavior normal.     Ortho Exam  Imaging: No results found.  Past Medical/Family/Surgical/Social History: Medications & Allergies reviewed per EMR, new medications updated. Patient Active Problem List  Diagnosis Date Noted   Dyspnea on exertion 05/22/2022   Lower GI bleed 01/30/2021   Myalgia due to statin 07/06/2020   B12 deficiency 12/03/2019   Dupuytren's contracture of right hand 10/28/2019   Hyperglycemia 10/01/2016   AAA (abdominal aortic aneurysm) (HCC) 01/18/2016   Meralgia paresthetica 12/28/2014   History of skin cancer 12/28/2014   Former smoker 12/28/2014   Abnormal liver enzymes 12/29/2010   Allergic rhinitis 06/17/2008   ERECTILE DYSFUNCTION 12/07/2007   History of colonic polyps 12/07/2007   Umbilical hernia 05/28/2007   Hyperlipidemia 05/15/2007   Essential hypertension 05/15/2007   GERD 05/15/2007   Past Medical History:  Diagnosis Date   Adenomatous colon polyp 06/2006   ALLERGIC RHINITIS    Allergy    seasonal allergies   Anemia    hx of   ANEMIA DUE TO DIETARY IRON DEFICIENCY 05/28/2007   Due to giving  regularly giving blood. Resolved with cutting in half.      COMPRESSION FRACTURE, THORACIC VERTEBRA 03/09/2008   2009, no chronic pain    Diverticulitis of colon    DIVERTICULOSIS, COLON 05/28/2007   Qualifier: Diagnosis of  By: Lovell Sheehan MD, Balinda Quails    GERD (gastroesophageal reflux disease)    on meds   Hyperlipidemia    on meds   Hypertension    on meds   Melanoma (HCC) 02/23/2014   MM in situe, early, low mid back, exc   Squamous cell carcinoma in situ 11/10/2018   Top of right shoulder, CX3, 5FU   Squamous cell carcinoma of skin    SCC IN SITU TOP OF RIGHT SHOULDER TX CX3 5FU   Ulcer 1992   gastric   Family History  Problem Relation Age of Onset   Cancer Father        lung, former smoker, [place] lung [place]   Dementia Mother    Hemochromatosis Brother    Colon cancer Neg Hx    Esophageal cancer Neg Hx    Rectal cancer Neg Hx    Stomach cancer Neg Hx    Colon polyps Neg Hx    Past Surgical History:  Procedure Laterality Date   COLONOSCOPY  01/2017   hx polyps/tics/hems   COLONOSCOPY  2019   [facility]-TICS/int hems/3 yr recall-   COLONOSCOPY W/ POLYPECTOMY  01/30/2021   Dr.Stark   COLONOSCOPY WITH PROPOFOL N/A 01/31/2021   Procedure: COLONOSCOPY WITH PROPOFOL;  Surgeon: Meryl Dare, MD;  Location: [facility];  Service: Endoscopy;  Laterality: N/A;   FOOT SURGERY Right    pain scraper several inches into foot   HEMOSTASIS CLIP PLACEMENT  01/31/2021   Procedure: HEMOSTASIS CLIP PLACEMENT;  Surgeon: Meryl Dare, MD;  Location: [facility];  Service: Endoscopy;;   POLYPECTOMY  2018   piecemeal polyps TAx 2   WISDOM TOOTH EXTRACTION     Social History   Occupational History   Not on file  Tobacco Use   Smoking status: Former    Current packs/day: 0.00    Average packs/day: 1 pack/day for 15.0 years (15.0 ttl pk-yrs)    Types: Cigarettes    Start date: 10/22/1963    Quit date: 10/21/1978    Years since quitting: 45.2   Smokeless tobacco: Never  Vaping Use    Vaping status: Never Used  Substance and Sexual Activity   Alcohol use: Yes    Alcohol/week: 14.0 standard drinks of alcohol    Types: 14 Standard drinks or equivalent per week    Comment:  2 per day   Drug use: No   Sexual activity: Yes

## 2024-01-04 ENCOUNTER — Other Ambulatory Visit: Payer: Self-pay | Admitting: Family Medicine

## 2024-01-08 ENCOUNTER — Encounter: Payer: Self-pay | Admitting: Family Medicine

## 2024-01-08 ENCOUNTER — Other Ambulatory Visit: Payer: Self-pay

## 2024-01-08 ENCOUNTER — Ambulatory Visit (INDEPENDENT_AMBULATORY_CARE_PROVIDER_SITE_OTHER): Payer: Medicare Other | Admitting: Family Medicine

## 2024-01-08 ENCOUNTER — Ambulatory Visit: Admitting: Physical Medicine and Rehabilitation

## 2024-01-08 VITALS — BP 141/73 | HR 65

## 2024-01-08 VITALS — BP 142/70 | HR 60 | Temp 97.3°F | Ht 69.5 in | Wt 210.8 lb

## 2024-01-08 DIAGNOSIS — Z131 Encounter for screening for diabetes mellitus: Secondary | ICD-10-CM

## 2024-01-08 DIAGNOSIS — Z125 Encounter for screening for malignant neoplasm of prostate: Secondary | ICD-10-CM

## 2024-01-08 DIAGNOSIS — E785 Hyperlipidemia, unspecified: Secondary | ICD-10-CM | POA: Diagnosis not present

## 2024-01-08 DIAGNOSIS — I10 Essential (primary) hypertension: Secondary | ICD-10-CM | POA: Diagnosis not present

## 2024-01-08 DIAGNOSIS — R739 Hyperglycemia, unspecified: Secondary | ICD-10-CM

## 2024-01-08 DIAGNOSIS — G720 Drug-induced myopathy: Secondary | ICD-10-CM | POA: Diagnosis not present

## 2024-01-08 DIAGNOSIS — E538 Deficiency of other specified B group vitamins: Secondary | ICD-10-CM

## 2024-01-08 DIAGNOSIS — I714 Abdominal aortic aneurysm, without rupture, unspecified: Secondary | ICD-10-CM | POA: Diagnosis not present

## 2024-01-08 DIAGNOSIS — J849 Interstitial pulmonary disease, unspecified: Secondary | ICD-10-CM

## 2024-01-08 DIAGNOSIS — Z0000 Encounter for general adult medical examination without abnormal findings: Secondary | ICD-10-CM | POA: Diagnosis not present

## 2024-01-08 DIAGNOSIS — M5416 Radiculopathy, lumbar region: Secondary | ICD-10-CM

## 2024-01-08 LAB — LIPID PANEL
Cholesterol: 185 mg/dL (ref 0–200)
HDL: 66.2 mg/dL (ref >39.00)
LDL Cholesterol: 98 mg/dL (ref 0–99)
NonHDL: 119.17
Total CHOL/HDL Ratio: 3
Triglycerides: 108 mg/dL (ref 0.0–149.0)
VLDL: 21.6 mg/dL (ref 0.0–40.0)

## 2024-01-08 LAB — PSA, MEDICARE: PSA: 0.92 ng/mL (ref 0.10–4.00)

## 2024-01-08 LAB — CBC WITH DIFFERENTIAL/PLATELET
Basophils Absolute: 0.1 10*3/uL (ref 0.0–0.1)
Basophils Relative: 1.1 % (ref 0.0–3.0)
Eosinophils Absolute: 0.4 10*3/uL (ref 0.0–0.7)
Eosinophils Relative: 6.5 % — ABNORMAL HIGH (ref 0.0–5.0)
HCT: 45.7 % (ref 39.0–52.0)
Hemoglobin: 15.4 g/dL (ref 13.0–17.0)
Lymphocytes Relative: 31.1 % (ref 12.0–46.0)
Lymphs Abs: 2.1 10*3/uL (ref 0.7–4.0)
MCHC: 33.8 g/dL (ref 30.0–36.0)
MCV: 88.4 fl (ref 78.0–100.0)
Monocytes Absolute: 0.6 10*3/uL (ref 0.1–1.0)
Monocytes Relative: 8.7 % (ref 3.0–12.0)
Neutro Abs: 3.6 10*3/uL (ref 1.4–7.7)
Neutrophils Relative %: 52.6 % (ref 43.0–77.0)
Platelets: 210 10*3/uL (ref 150.0–400.0)
RBC: 5.17 Mil/uL (ref 4.22–5.81)
RDW: 13.4 % (ref 11.5–15.5)
WBC: 6.8 10*3/uL (ref 4.0–10.5)

## 2024-01-08 LAB — COMPREHENSIVE METABOLIC PANEL
ALT: 25 U/L (ref 0–53)
AST: 24 U/L (ref 0–37)
Albumin: 4.5 g/dL (ref 3.5–5.2)
Alkaline Phosphatase: 55 U/L (ref 39–117)
BUN: 14 mg/dL (ref 6–23)
CO2: 24 meq/L (ref 19–32)
Calcium: 9.4 mg/dL (ref 8.4–10.5)
Chloride: 102 meq/L (ref 96–112)
Creatinine, Ser: 0.89 mg/dL (ref 0.40–1.50)
GFR: 85.08 mL/min (ref >60.00)
Glucose, Bld: 91 mg/dL (ref 70–99)
Potassium: 4.1 meq/L (ref 3.5–5.1)
Sodium: 137 meq/L (ref 135–145)
Total Bilirubin: 1.1 mg/dL (ref 0.2–1.2)
Total Protein: 7.4 g/dL (ref 6.0–8.3)

## 2024-01-08 LAB — HEMOGLOBIN A1C: Hgb A1c MFr Bld: 5.1 % (ref 4.6–6.5)

## 2024-01-08 LAB — VITAMIN B12: Vitamin B-12: >1537 pg/mL — ABNORMAL HIGH (ref 211–911)

## 2024-01-08 MED ORDER — METHYLPREDNISOLONE ACETATE 40 MG/ML IJ SUSP
40.0000 mg | Freq: Once | INTRAMUSCULAR | Status: AC
  Administered 2024-01-08: 40 mg

## 2024-01-08 MED ORDER — CYANOCOBALAMIN 1000 MCG/ML IJ SOLN
1000.0000 ug | Freq: Once | INTRAMUSCULAR | Status: AC
  Administered 2024-01-08: 1000 ug via INTRAMUSCULAR

## 2024-01-08 MED ORDER — SILDENAFIL CITRATE 100 MG PO TABS: 100.0000 mg | ORAL_TABLET | Freq: Every day | ORAL | 11 refills | Status: AC | PRN

## 2024-01-08 NOTE — Progress Notes (Signed)
 Phone: [phone]   Subjective:  Patient presents today for their annual physical. Chief complaint-noted.   See problem oriented charting- ROS- full  review of systems was completed and negative  Per full ROS sheet completed by patient  The following were reviewed and entered/updated in epic: Past Medical History:  Diagnosis Date   Adenomatous colon polyp 06/2006   ALLERGIC RHINITIS    Allergy    seasonal allergies   Anemia    hx of   ANEMIA DUE TO DIETARY IRON DEFICIENCY 05/28/2007   Due to giving regularly giving blood. Resolved with cutting in half.      COMPRESSION FRACTURE, THORACIC VERTEBRA 03/09/2008   2009, no chronic pain    Diverticulitis of colon    DIVERTICULOSIS, COLON 05/28/2007   Qualifier: Diagnosis of  By: Lovell Sheehan MD, Balinda Quails    GERD (gastroesophageal reflux disease)    on meds   Hyperlipidemia    on meds   Hypertension    on meds   Melanoma (HCC) 02/23/2014   MM in situe, early, low mid back, exc   Squamous cell carcinoma in situ 11/10/2018   Top of right shoulder, CX3, 5FU   Squamous cell carcinoma of skin    SCC IN SITU TOP OF RIGHT SHOULDER TX CX3 5FU   Ulcer 1992   gastric   Patient Active Problem List   Diagnosis Date Noted   Lower GI bleed 01/30/2021    Priority: High   AAA (abdominal aortic aneurysm) (HCC) 01/18/2016    Priority: High   Myalgia due to statin 07/06/2020    Priority: Medium    B12 deficiency 12/03/2019    Priority: Medium    Hyperglycemia 10/01/2016    Priority: Medium    Meralgia paresthetica 12/28/2014    Priority: Medium    Hyperlipidemia 05/15/2007    Priority: Medium    Essential hypertension 05/15/2007    Priority: Medium    Dupuytren's contracture of right hand 10/28/2019    Priority: Low   History of skin cancer 12/28/2014    Priority: Low   Former smoker 12/28/2014    Priority: Low   Abnormal liver enzymes 12/29/2010    Priority: Low   Allergic rhinitis 06/17/2008    Priority: Low   ERECTILE DYSFUNCTION  12/07/2007    Priority: Low   History of colonic polyps 12/07/2007    Priority: Low   Umbilical hernia 05/28/2007    Priority: Low   GERD 05/15/2007    Priority: Low   Dyspnea on exertion 05/22/2022   Past Surgical History:  Procedure Laterality Date   COLONOSCOPY  01/2017   hx polyps/tics/hems   COLONOSCOPY  2019   [facility]-TICS/int hems/3 yr recall-   COLONOSCOPY W/ POLYPECTOMY  01/30/2021   Dr.Stark   COLONOSCOPY WITH PROPOFOL N/A 01/31/2021   Procedure: COLONOSCOPY WITH PROPOFOL;  Surgeon: Meryl Dare, MD;  Location: [facility];  Service: Endoscopy;  Laterality: N/A;   FOOT SURGERY Right    pain scraper several inches into foot   HEMOSTASIS CLIP PLACEMENT  01/31/2021   Procedure: HEMOSTASIS CLIP PLACEMENT;  Surgeon: Meryl Dare, MD;  Location: [facility];  Service: Endoscopy;;   POLYPECTOMY  2018   piecemeal polyps TAx 2   WISDOM TOOTH EXTRACTION      Family History  Problem Relation Age of Onset   Cancer Father        lung, former smoker, [place] lung in [place]   Dementia Mother    Hemochromatosis Brother  Colon cancer Neg Hx    Esophageal cancer Neg Hx    Rectal cancer Neg Hx    Stomach cancer Neg Hx    Colon polyps Neg Hx     Medications- reviewed and updated Current Outpatient Medications  Medication Sig Dispense Refill   amLODipine (NORVASC) 10 MG tablet Take 10 mg by mouth daily. Through [facility] cardiology     aspirin 81 MG chewable tablet Chew by mouth.     cetirizine (ZYRTEC) 10 MG tablet Take 10 mg by mouth daily.     Cyanocobalamin 2000 MCG/ML SOLN Inject 1,000 mcg as directed every 30 (thirty) days. Patient comes get his b12 injection every 3rd Thursday in the month.     furosemide (LASIX) 20 MG tablet Take by mouth.     gabapentin (NEURONTIN) 300 MG capsule TAKE 1 CAPSULE BY MOUTH TWICE A DAY 120 capsule 1   losartan (COZAAR) 100 MG tablet Take 1 tablet by mouth daily.     MULTIPLE VITAMIN PO Take 1 tablet by mouth daily.     Omega-3 Fatty  Acids (FISH OIL) 1000 MG CAPS Take 2 capsules by mouth daily.     omeprazole (PRILOSEC) 40 MG capsule TAKE 1 CAPSULE BY MOUTH EVERY DAY 90 capsule 1   rosuvastatin (CRESTOR) 5 MG tablet TAKE 1 TABLET BY MOUTH TWICE WEEKLY 26 tablet 3   spironolactone (ALDACTONE) 25 MG tablet Take 12.5 mg by mouth daily. Through [facility] cardiology     tizanidine (ZANAFLEX) 2 MG capsule TAKE 1 CAPSULE(2 MG) BY MOUTH TWICE DAILY AS NEEDED FOR MUSCLE SPASMS (Patient not taking: Reported on 01/08/2024) 40 capsule 2   No current facility-administered medications for this visit.    Allergies-reviewed and updated Allergies  Allergen Reactions   Atorvastatin     Other reaction(s): Cramp    Social History   Social History Narrative   Married (wife pt of Dr. Selena Batten). 5 children (2 by first wife, 3 stepkids), 11 granddaughters + 2 greatgrandsons.       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 Oropharynx normal Neck: no thyromegaly CV: RRR no murmurs rubs or gallops Lungs: CTAB no crackles, wheeze, rhonchi Abdomen: soft/nontender/nondistended/normal bowel sounds. No rebound or guarding. Umbilical hernia- wants to hold off on repair until loses more weight Ext: no edema Skin: warm, dry Neuro: grossly normal, moves all extremities, PERRLA Decles genitourinary and rectal    Assessment and Plan  73 y.o. male presenting for annual physical.  Health Maintenance counseling: 1. Anticipatory guidance: Patient counseled regarding regular dental exams -q6 months, eye exams -yearly,  avoiding smoking and second hand smoke , limiting alcohol to 2 beverages per day - 2 shots daily, no illicit  drugs .   2. Risk factor reduction:  Advised patient of need for regular exercise and diet rich and fruits and vegetables to reduce risk of heart attack and stroke.  Exercise- walking 3.5 miles a day with incline plus working out.  Diet/weight management-Down 12 pounds from last visit- trying to clean up diet also helpful.  Wt Readings  from Last 3 Encounters:  01/08/24 210 lb 12.8 oz (95.6 kg)  12/09/23 221 lb (100.2 kg)  11/26/22 229 lb (103.9 kg)  3. Immunizations/screenings/ancillary studies-declines COVID shot this season but otherwise up-to-date Immunization History  Administered Date(s) Administered   Fluad Quad(high Dose 65+) 06/22/2019, 07/06/2020, 07/05/2021, 08/08/2022   Fluad Trivalent(High Dose 65+) 07/03/2023   Influenza Split 07/30/2011, 07/10/2012, 06/21/2016   Influenza Whole 08/04/2007, 07/21/2008, 07/18/2009, 07/16/2010   Influenza, High Dose Seasonal PF 07/14/2015, 07/16/2016, 07/17/2017, 07/21/2017, 07/09/2018   Influenza,inj,Quad PF,6+ Mos 07/05/2013, 08/03/2014, 07/13/2015   Influenza-Unspecified 06/22/2019, 07/06/2020, 06/21/2021, 08/08/2022   PFIZER(Purple Top)SARS-COV-2 Vaccination 11/21/2019, 12/12/2019, 10/03/2020   PNEUMOCOCCAL CONJUGATE-20 09/16/2022   Pneumococcal Conjugate-13 01/09/2016   Pneumococcal Polysaccharide-23 04/01/2017   Rsv, Bivalent, Protein Subunit Rsvpref,pf Verdis Frederickson) 12/19/2022   Td 10/22/1999, 12/11/2009   Tdap 07/28/2015   Zoster Recombinant(Shingrix) 02/26/2020, 04/26/2020  4. Prostate cancer screening- low risk prior trend-we have plan to do 1 final check at last visit but does not appear was ordered so we will check today. Nocturia once a night stable Lab Results  Component Value Date   PSA 0.94 10/28/2019   PSA 0.90 10/26/2018   PSA 0.99 01/21/2018   5. Colon cancer screening - Colonoscopy 09/05/2021 with 3-year repeat- due in November- should get letter 6. Skin cancer screening- [facility]. advised regular sunscreen use.  Denies worrisome, changing, or new skin lesions.  7. Smoking associated screening (lung cancer screening, AAA screen 65-75, UA)- former  smoker- quit smokign 1980s after 15 pack years. Aneurysm screening as below 8. STD screening - only active with wife  Status of chronic or acute concerns   # Interstitial lung disease-has seen Dr. Judeth Horn in the past-he has declined antifibrotic therapy but plan was for 83-month follow-up- overdue- refer back  -some shortness of breath as well- cardiology working on reducing fluid on lungs plus exercise and breathing in general has improved  #hypertension S: medication: amlodipine 10 mg, losartan 100 mg,Spironolactone 12.5 mg, Lasix 20 mg  Home readings #s: [facility] and has [facility] cuff- they are usually within 10 points  Readings as low as 121/63 and in general trending down A/P: blood pressure mildly elevated in office but several good readings at home and trending down- I want him to update me in 2 weeks with home readings- if average above 135/85 may see if he can tolerate spironolactone 25 mg  #hyperlipidemia S: Medication:Prefers to take aspirin 81 mg despite prior GI bleed but was polypectomy related and cardiology recommended, rosuvastatin 5 mg twice weekly, fish oil Lab Results  Component Value Date   CHOL 184 05/22/2022   HDL 48.60 05/22/2022   LDLCALC 91 09/28/2021   LDLDIRECT 109.0 05/22/2022   TRIG 214.0 (H) 05/22/2022   CHOLHDL 4 05/22/2022  A/P: lipids slightly above goal in past- update LDL- he is ok trying 3 days a week if neede-D did have muscle aches on atorvastatin in past  # GERD S:Medication: Omeprazole 40 mg A/P: stable- continue current medicines    # B12 deficiency S: Current treatment/medication (oral vs. IM): B12 injections monthly (prefers over retrying oral) A/P: likely looks good- update B12 today   # Abdominal aortic aneurysm-question of over read in the past versus improvement-was noted 01/17/2016-most  recent imaging on 11/27/2021 vascular ultrasound "Abdominal Aorta: No evidence of an abdominal aortic aneurysm was  visualized. The largest aortic measurement is 2.82 cm. The remains  essentially unchanged compared to prior exam. Previous diameter  measurement was 2.9  cm obtained on 06/24/2019. "  # Chronic back pain-working with Dr. Alvester Morin with  medical [facility]-transforaminal epidural steroid injections helpful in the past- injection this afternoon planned  -Dr. Alvester Morin has mention possible Celebrex  -Patient also uses tizanidine as needed  - gabapentin 300 mg twice a day helps with radicular pain  # Erectile dysfunction-tadalafil 10 mg helpful   but prefers Viagra- new prescription today  # Allergies-cetirizine helpful   # Hyperglycemia- prior sugars slightly high but a1c ok in past- update today for screening purposes  Recommended follow up: Return in about 1 year (around 01/07/2025) for physical or sooner if needed.Schedule b4 you leave. Future Appointments  Date Time Provider Department Center  01/08/2024  2:30 PM Tyrell Antonio, MD [facility] None  02/05/2024  9:15 AM [facility] CLINICAL SUPPORT [facility]  03/04/2024  9:00 AM [facility] CLINICAL SUPPORT [facility]  04/08/2024  9:00 AM [facility] CLINICAL SUPPORT [facility]  05/06/2024  9:00 AM [facility] CLINICAL SUPPORT [facility]  06/10/2024  9:00 AM [facility] CLINICAL SUPPORT [facility]  07/08/2024  9:00 AM [facility] CLINICAL SUPPORT [facility]  08/05/2024  9:00 AM [facility] CLINICAL SUPPORT [facility]  09/09/2024  9:00 AM [facility] CLINICAL SUPPORT [facility]  10/07/2024  9:00 AM [facility] CLINICAL SUPPORT [facility]  12/16/2024  8:00 AM [facility] ANNUAL WELLNESS VISIT 1 [facility]   Lab/Order associations: fasting   ICD-10-CM   1. Preventative health care  Z00.00     2. Vitamin B 12 deficiency  E53.8 cyanocobalamin (VITAMIN B12) injection 1,000 mcg    3. Screening for prostate cancer  Z12.5     4.  Screening for diabetes mellitus  Z13.1     5. Abdominal aortic aneurysm (AAA) without rupture, unspecified part (HCC)  I71.40     6. Hyperlipidemia, unspecified hyperlipidemia type  E78.5     7. Essential hypertension  I10     8. B12 deficiency  E53.8     9. Hyperglycemia  R73.9     10. ILD (interstitial lung disease) (HCC)  J84.9     11. Drug-induced myopathy  G72.0       Meds ordered this encounter  Medications   cyanocobalamin (VITAMIN B12) injection 1,000 mcg    Return precautions advised.  Tana Conch, MD

## 2024-01-08 NOTE — Patient Instructions (Signed)

## 2024-01-08 NOTE — Patient Instructions (Addendum)
 If you notice any sensitivity or growth behind nipples- let me know- would likely change spironolactone to a different but similar medicine  We have placed a referral for you today to pulmonology again- please call their # if you do not hear within a week (may be listed below or you may see mychart message within a few days with #).    Please stop by lab before you go If you have mychart- we will send your results within 3 business days of [place] receiving them.  If you do not have mychart- we will call you about results within 5 business days of [place] receiving them.  *please also note that you will see labs on mychart as soon as they post. I will later go in and write notes on them- will say "notes from Dr. Durene Cal"   Recommended follow up: Return in about 1 year (around 01/07/2025) for physical or sooner if needed.Schedule b4 you leave.

## 2024-01-08 NOTE — Progress Notes (Signed)
 Pain Scale   Average Pain 8        +Driver, -+BT, -Dye Allergies.

## 2024-01-09 ENCOUNTER — Other Ambulatory Visit: Payer: Self-pay

## 2024-01-09 MED ORDER — ROSUVASTATIN CALCIUM 5 MG PO TABS
5.0000 mg | ORAL_TABLET | ORAL | 3 refills | Status: DC
Start: 2024-01-09 — End: 2024-04-09

## 2024-01-15 NOTE — Procedures (Signed)
 Lumbosacral Transforaminal Epidural Steroid Injection - Sub-Pedicular Approach with Fluoroscopic Guidance  Patient: Larry Williamson      Date of Birth: 07-12-1951 MRN: [id number] PCP: Shelva Majestic, MD      Visit Date: 01/08/2024   Universal Protocol:    Date/Time: 01/08/2024  Consent Given By: the patient  Position: PRONE  Additional Comments: Vital signs were monitored before and after the procedure. Patient was prepped and draped in the usual sterile fashion. The correct patient, procedure, and site was verified.   Injection Procedure Details:   Procedure diagnoses: Lumbar radiculopathy [M54.16]    Meds Administered:  Meds ordered this encounter  Medications   methylPREDNISolone acetate (DEPO-MEDROL) injection 40 mg    Laterality: Bilateral  Location/Site: L4  Needle:5.0 in., 22 ga.  Short bevel or Quincke spinal needle  Needle Placement: Transforaminal  Findings:    -Comments: Excellent flow of contrast along the nerve, nerve root and into the epidural space.  Procedure Details: After squaring off the end-plates to get a true AP view, the C-arm was positioned so that an oblique view of the foramen as noted above was visualized. The target area is just inferior to the "nose of the scotty dog" or sub pedicular. The soft tissues overlying this structure were infiltrated with 2-3 ml. of 1% Lidocaine without Epinephrine.  The spinal needle was inserted toward the target using a "trajectory" view along the fluoroscope beam.  Under AP and lateral visualization, the needle was advanced so it did not puncture dura and was located close the 6 O'Clock position of the pedical in AP tracterory. Biplanar projections were used to confirm position. Aspiration was confirmed to be negative for CSF and/or blood. A 1-2 ml. volume of Isovue-250 was injected and flow of contrast was noted at each level. Radiographs were obtained for documentation purposes.   After attaining the  desired flow of contrast documented above, a 0.5 to 1.0 ml test dose of 0.25% Marcaine was injected into each respective transforaminal space.  The patient was observed for 90 seconds post injection.  After no sensory deficits were reported, and normal lower extremity motor function was noted,   the above injectate was administered so that equal amounts of the injectate were placed at each foramen (level) into the transforaminal epidural space.   Additional Comments:  The patient tolerated the procedure well Dressing: 2 x 2 sterile gauze and Band-Aid    Post-procedure details: Patient was observed during the procedure. Post-procedure instructions were reviewed.  Patient left the clinic in stable condition.

## 2024-01-15 NOTE — Progress Notes (Signed)
 Larry Williamson - 73 y.o. male MRN [id number]  Date of birth: 1951-05-19  Office Visit Note: Visit Date: 01/08/2024 PCP: Shelva Majestic, MD Referred by: Shelva Majestic, MD  Subjective: Chief Complaint  Patient presents with   Lower Back - Pain   HPI:  Larry Williamson is a 73 y.o. male who comes in today at the request of [name], FNP for planned Bilateral L4-5 Lumbar Transforaminal epidural steroid injection with fluoroscopic guidance.  The patient has failed conservative care including home exercise, medications, time and activity modification.  This injection will be diagnostic and hopefully therapeutic.  Please see requesting physician notes for further details and justification.   ROS Otherwise per HPI.  Assessment & Plan: Visit Diagnoses:    ICD-10-CM   1. Lumbar radiculopathy  M54.16 XR C-ARM NO REPORT    Epidural Steroid injection    methylPREDNISolone acetate (DEPO-MEDROL) injection 40 mg      Plan: No additional findings.   Meds & Orders:  Meds ordered this encounter  Medications   methylPREDNISolone acetate (DEPO-MEDROL) injection 40 mg    Orders Placed This Encounter  Procedures   XR C-ARM NO REPORT   Epidural Steroid injection    Follow-up: Return if symptoms worsen or fail to improve.   Procedures: No procedures performed  Lumbosacral Transforaminal Epidural Steroid Injection - Sub-Pedicular Approach with Fluoroscopic Guidance  Patient: Larry Williamson      Date of Birth: 04/27/1951 MRN: [id number] PCP: Shelva Majestic, MD      Visit Date: 01/08/2024   Universal Protocol:    Date/Time: 01/08/2024  Consent Given By: the patient  Position: PRONE  Additional Comments: Vital signs were monitored before and after the procedure. Patient was prepped and draped in the usual sterile fashion. The correct patient, procedure, and site was verified.   Injection Procedure Details:   Procedure diagnoses: Lumbar  radiculopathy [M54.16]    Meds Administered:  Meds ordered this encounter  Medications   methylPREDNISolone acetate (DEPO-MEDROL) injection 40 mg    Laterality: Bilateral  Location/Site: L4  Needle:5.0 in., 22 ga.  Short bevel or Quincke spinal needle  Needle Placement: Transforaminal  Findings:    -Comments: Excellent flow of contrast along the nerve, nerve root and into the epidural space.  Procedure Details: After squaring off the end-plates to get a true AP view, the C-arm was positioned so that an oblique view of the foramen as noted above was visualized. The target area is just inferior to the "nose of the scotty dog" or sub pedicular. The soft tissues overlying this structure were infiltrated with 2-3 ml. of 1% Lidocaine without Epinephrine.  The spinal needle was inserted toward the target using a "trajectory" view along the fluoroscope beam.  Under AP and lateral visualization, the needle was advanced so it did not puncture dura and was located close the 6 O'Clock position of the pedical in AP tracterory. Biplanar projections were used to confirm position. Aspiration was confirmed to be negative for CSF and/or blood. A 1-2 ml. volume of Isovue-250 was injected and flow of contrast was noted at each level. Radiographs were obtained for documentation purposes.   After attaining the desired flow of contrast documented above, a 0.5 to 1.0 ml test dose of 0.25% Marcaine was injected into each respective transforaminal space.  The patient was observed for 90 seconds post injection.  After no sensory deficits were reported, and normal lower extremity motor function was noted,   the above injectate  was administered so that equal amounts of the injectate were placed at each foramen (level) into the transforaminal epidural space.   Additional Comments:  The patient tolerated the procedure well Dressing: 2 x 2 sterile gauze and Band-Aid    Post-procedure details: Patient was observed  during the procedure. Post-procedure instructions were reviewed.  Patient left the clinic in stable condition.    Clinical History: Narrative & Impression CLINICAL DATA:  Low back pain   EXAM: MRI LUMBAR SPINE WITHOUT CONTRAST   TECHNIQUE: Multiplanar, multisequence MR imaging of the lumbar spine was performed. No intravenous contrast was administered.   COMPARISON:  2015   FINDINGS: Segmentation:  Standard.   Alignment:  Preserved and stable.   Vertebrae: Stable vertebral body heights. There is no substantial marrow edema. There is no suspicious osseous lesion.   Conus medullaris and cauda equina: Conus extends to the L1 level. Conus and cauda equina appear normal.   Paraspinal and other soft tissues: Unremarkable.   Disc levels:   L1-L2:  Disc bulge.  No canal or foraminal stenosis.   L2-L3:  Disc bulge.  No canal stenosis.  Minor foraminal stenosis.   L3-L4:  Disc bulge.  No canal stenosis.  Minor foraminal stenosis.   L4-L5: Disc bulge with endplate osteophytic ridging and superimposed central protrusion. Facet arthropathy with ligamentum flavum infolding. Minor canal stenosis. Narrowing of the subarticular recesses. Increased moderate to marked right foraminal stenosis. Mild left foraminal stenosis.   L5-S1: Disc bulge with endplate osteophytic ridging. Facet arthropathy. No canal stenosis. Moderate foraminal stenosis, right greater than left.   IMPRESSION: Multilevel degenerative changes as detailed above. There is increased right foraminal stenosis at L4-L5. Otherwise no substantial progression since 2015.     Electronically Signed   By: Guadlupe Spanish M.D.   On: 11/08/2020 08:29     Objective:  VS:  HT:    WT:   BMI:     BP:(!) 141/73  HR:65bpm  TEMP: ( )  RESP:  Physical Exam Vitals and nursing note reviewed.  Constitutional:      General: He is not in acute distress.    Appearance: Normal appearance. He is not ill-appearing.  HENT:      Head: Normocephalic and atraumatic.     Right Ear: External ear normal.     Left Ear: External ear normal.     Nose: No congestion.  Eyes:     Extraocular Movements: Extraocular movements intact.  Cardiovascular:     Rate and Rhythm: Normal rate.     Pulses: Normal pulses.  Pulmonary:     Effort: Pulmonary effort is normal. No respiratory distress.  Abdominal:     General: There is no distension.     Palpations: Abdomen is soft.  Musculoskeletal:        General: No tenderness or signs of injury.     Cervical back: Neck supple.     Right lower leg: No edema.     Left lower leg: No edema.     Comments: Patient has good distal strength without clonus.  Skin:    Findings: No erythema or rash.  Neurological:     General: No focal deficit present.     Mental Status: He is alert and oriented to person, place, and time.     Sensory: No sensory deficit.     Motor: No weakness or abnormal muscle tone.     Coordination: Coordination normal.  Psychiatric:        Mood and Affect:  Mood normal.        Behavior: Behavior normal.      Imaging: No results found.

## 2024-01-21 ENCOUNTER — Encounter: Payer: Self-pay | Admitting: Pulmonary Disease

## 2024-01-21 ENCOUNTER — Ambulatory Visit: Admitting: Pulmonary Disease

## 2024-01-21 VITALS — BP 150/75 | HR 68 | Ht 70.0 in | Wt 209.0 lb

## 2024-01-21 DIAGNOSIS — J849 Interstitial pulmonary disease, unspecified: Secondary | ICD-10-CM

## 2024-01-21 NOTE — Progress Notes (Signed)
 @Patient  ID: Larry Williamson, male    DOB: 03/27/1951, 73 y.o.   MRN: [id number]  Chief complaint: Dyspnea on exertion  Referring provider: Shelva Majestic, MD  HPI:   73 y.o. man whom are seen in follow up for evaluation of dyspnea on exertion.  Most recent PCP note reviewed.  Regarding follow-up.  A bit overdue.  Overall doing well.  No change in symptoms.  Exercises daily, 3 miles walking.  Other weightlifting.  No dyspnea or exertional limitation.  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  Given lack of change in symptoms or worsening, no need for further workup or change to therapies at this time.  HPI at initial visit: Patient reports onset of shortness of breath over the last year or so.  Okay at rest but with ongoing exertion gets short winded, fatigued.  Stable over the last year or so.  Not really worsening.  But certainly affects his ability to do things he enjoys day-to-day.  No time of day when things are better or worse.  No position make things better or worse.  No seasonal environmental factors she can identify that make things better or worse.  No other relieving or exacerbating factors.  He reports a history of of constant allergy-like symptoms.  Watery eyes, runny nose, nasal/sinus congestion.  Takes daily antihistamine.  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  Most recent chest imaging chest x-ray/2023 personally reviewed and interpreted as scattered prominent interstitial marking/thickening with nodularity, otherwise clear.  Most recent TTE 05/2022 personally reviewed which demonstrates no significant abnormalities.  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 life, some asbestos exposure   Public house manager / Pulmonary Flowsheets:   ACT:      No data to display          MMRC:     No data to display          Epworth:      No data to display          Tests:   FENO:  No results found for: "NITRICOXIDE"  PFT:    Latest Ref Rng & Units 08/20/2022   11:42 AM  PFT Results  FVC-Pre L 2.74   FVC-Predicted Pre % 65   FVC-Post L 2.76   FVC-Predicted Post % 65   Pre FEV1/FVC % % 85   Post FEV1/FCV % % 82   FEV1-Pre L 2.34   FEV1-Predicted Pre % 76   FEV1-Post L 2.25   DLCO uncorrected ml/min/mmHg 18.61   DLCO UNC% % 74   DLCO corrected ml/min/mmHg 18.61   DLCO COR %Predicted % 74   DLVA Predicted % 122   TLC L 3.90   TLC % Predicted % 57   RV % Predicted % 45   Personally reviewed interpreted as spirometry suggestive of moderate restriction versus air trapping.  Lung volumes confirm moderate restriction.  DLCO within normal limits.  WALK:      No data to display          Imaging: Personally reviewed and as per EMR discussion this note XR C-ARM NO REPORT Result Date: 01/08/2024 Please see Notes tab for imaging  impression.  Epidural Steroid injection Result Date: 01/08/2024 Tyrell Antonio, MD     01/15/2024  8:56 AM Lumbosacral Transforaminal Epidural Steroid Injection - Sub-Pedicular Approach with Fluoroscopic Guidance Patient: Larry Williamson     Date of Birth: Jul 11, 1951 MRN: [id number] PCP: Shelva Majestic, MD     Visit Date: 01/08/2024  Universal Protocol:   Date/Time: 01/08/2024 Consent Given By: the patient Position: PRONE Additional Comments: Vital signs were monitored before and after the procedure. Patient was prepped and draped in the usual sterile fashion. The correct patient, procedure, and site was verified. Injection Procedure Details: Procedure diagnoses: Lumbar radiculopathy [M54.16]  Meds Administered: Meds  ordered this encounter Medications  methylPREDNISolone acetate (DEPO-MEDROL) injection 40 mg Laterality: Bilateral Location/Site: L4 Needle:5.0 in., 22 ga.  Short bevel or Quincke spinal needle Needle Placement: Transforaminal Findings:   -Comments: Excellent flow of contrast along the nerve, nerve root and into the epidural space. Procedure Details: After squaring off the end-plates to get a true AP view, the C-arm was positioned so that an oblique view of the foramen as noted above was visualized. The target area is just inferior to the "nose of the scotty dog" or sub pedicular. The soft tissues overlying this structure were infiltrated with 2-3 ml. of 1% Lidocaine without Epinephrine. The spinal needle was inserted toward the target using a "trajectory" view along the fluoroscope beam.  Under AP and lateral visualization, the needle was advanced so it did not puncture dura and was located close the 6 O'Clock position of the pedical in AP tracterory. Biplanar projections were used to confirm position. Aspiration was confirmed to be negative for CSF and/or blood. A 1-2 ml. volume of Isovue-250 was injected and flow of contrast was noted at each level. Radiographs were obtained for documentation purposes. After attaining the desired flow of contrast documented above, a 0.5 to 1.0 ml test dose of 0.25% Marcaine was injected into each respective transforaminal space.  The patient was observed for 90 seconds post injection.  After no sensory deficits were reported, and normal lower extremity motor function was noted,   the above injectate was administered so that equal amounts of the injectate were placed at each foramen (level) into the transforaminal epidural space. Additional Comments: The patient tolerated the procedure well Dressing: 2 x 2 sterile gauze and Band-Aid  Post-procedure details: Patient was observed during the procedure. Post-procedure instructions were reviewed. Patient left the clinic in stable  condition.    Lab Results: Personally reviewed, eosinophils 400 CBC    Component Value Date/Time   WBC 6.8 01/08/2024 0850   RBC 5.17 01/08/2024 0850   HGB 15.4 01/08/2024 0850   HCT 45.7 01/08/2024 0850   PLT 210.0 01/08/2024 0850   MCV 88.4 01/08/2024 0850   MCH 31.1 01/31/2021 1734   MCHC 33.8 01/08/2024 0850   RDW 13.4 01/08/2024 0850   LYMPHSABS 2.1 01/08/2024 0850   MONOABS 0.6 01/08/2024 0850   EOSABS 0.4 01/08/2024 0850   BASOSABS 0.1 01/08/2024 0850    BMET    Component Value Date/Time   NA 137 01/08/2024 0850   NA 136 (A) 09/17/2022 0000   K 4.1 01/08/2024 0850   CL 102 01/08/2024 0850   CO2 24 01/08/2024 0850   GLUCOSE 91 01/08/2024 0850   BUN 14 01/08/2024 0850   BUN 18 09/17/2022 0000   CREATININE 0.89 01/08/2024 0850   CREATININE 1.02 07/06/2020 0849   CALCIUM 9.4 01/08/2024 0850   CALCIUM 9.3 03/09/2008 2243   GFRNONAA >  60 01/31/2021 0050   GFRNONAA 75 07/06/2020 0849   GFRAA 87 07/06/2020 0849    BNP No results found for: "BNP"  ProBNP No results found for: "PROBNP"  Specialty Problems       Pulmonary Problems   Allergic rhinitis   zyrtec       Dyspnea on exertion    Allergies  Allergen Reactions   Atorvastatin     Other reaction(s): Cramp    Immunization History  Administered Date(s) Administered   Fluad Quad(high Dose 65+) 06/22/2019, 07/06/2020, 07/05/2021, 08/08/2022   Fluad Trivalent(High Dose 65+) 07/03/2023   Influenza Split 07/30/2011, 07/10/2012, 06/21/2016   Influenza Whole 08/04/2007, 07/21/2008, 07/18/2009, 07/16/2010   Influenza, High Dose Seasonal PF 07/14/2015, 07/16/2016, 07/17/2017, 07/21/2017, 07/09/2018   Influenza,inj,Quad PF,6+ Mos 07/05/2013, 08/03/2014, 07/13/2015   Influenza-Unspecified 06/22/2019, 07/06/2020, 06/21/2021, 08/08/2022   PFIZER(Purple Top)SARS-COV-2 Vaccination 11/21/2019, 12/12/2019, 10/03/2020   PNEUMOCOCCAL CONJUGATE-20 09/16/2022   Pneumococcal Conjugate-13 01/09/2016    Pneumococcal Polysaccharide-23 04/01/2017   Rsv, Bivalent, Protein Subunit Rsvpref,pf (Abrysvo) 12/19/2022   Td 10/22/1999, 12/11/2009   Tdap 07/28/2015   Zoster Recombinant(Shingrix) 02/26/2020, 04/26/2020    Past Medical History:  Diagnosis Date   Adenomatous colon polyp 06/2006   ALLERGIC RHINITIS    Allergy    seasonal allergies   Anemia    hx of   ANEMIA DUE TO DIETARY IRON DEFICIENCY 05/28/2007   Due to giving regularly giving blood. Resolved with cutting in half.      COMPRESSION FRACTURE, THORACIC VERTEBRA 03/09/2008   2009, no chronic pain    Diverticulitis of colon    DIVERTICULOSIS, COLON 05/28/2007   Qualifier: Diagnosis of  By: Lovell Sheehan MD, Balinda Quails    GERD (gastroesophageal reflux disease)    on meds   Hyperlipidemia    on meds   Hypertension    on meds   Melanoma (HCC) 02/23/2014   MM in situe, early, low mid back, exc   Squamous cell carcinoma in situ 11/10/2018   Top of right shoulder, CX3, 5FU   Squamous cell carcinoma of skin    SCC IN SITU TOP OF RIGHT SHOULDER TX CX3 5FU   Ulcer 1992   gastric    Tobacco History: Social History   Tobacco Use  Smoking Status Former   Current packs/day: 0.00   Average packs/day: 1 pack/day for 15.0 years (15.0 ttl pk-yrs)   Types: Cigarettes   Start date: 10/22/1963   Quit date: 10/21/1978   Years since quitting: 45.2  Smokeless Tobacco Never   Counseling given: Not Answered   Continue to not smoke  Outpatient Encounter Medications as of 01/21/2024  Medication Sig   amLODipine (NORVASC) 10 MG tablet Take 10 mg by mouth daily. Through [facility] cardiology   aspirin 81 MG chewable tablet Chew by mouth.   cetirizine (ZYRTEC) 10 MG tablet Take 10 mg by mouth daily.   Cyanocobalamin 2000 MCG/ML SOLN Inject 1,000 mcg as directed every 30 (thirty) days. Patient comes get his b12 injection every 3rd Thursday in the month.   furosemide (LASIX) 20 MG tablet Take by mouth.   gabapentin (NEURONTIN) 300 MG capsule TAKE 1 CAPSULE BY  MOUTH TWICE A DAY   losartan (COZAAR) 100 MG tablet Take 1 tablet by mouth daily.   MULTIPLE VITAMIN PO Take 1 tablet by mouth daily.   Omega-3 Fatty Acids (FISH OIL) 1000 MG CAPS Take 2 capsules by mouth daily.   omeprazole (PRILOSEC) 40 MG capsule TAKE 1 CAPSULE BY MOUTH EVERY DAY  rosuvastatin (CRESTOR) 5 MG tablet Take 1 tablet (5 mg total) by mouth 3 (three) times a week.   sildenafil (VIAGRA) 100 MG tablet Take 1 tablet (100 mg total) by mouth daily as needed for erectile dysfunction. (Patient not taking: Reported on 01/21/2024)   spironolactone (ALDACTONE) 25 MG tablet Take 12.5 mg by mouth daily. Through [facility] cardiology (Patient not taking: Reported on 01/21/2024)   tizanidine (ZANAFLEX) 2 MG capsule TAKE 1 CAPSULE(2 MG) BY MOUTH TWICE DAILY AS NEEDED FOR MUSCLE SPASMS (Patient not taking: Reported on 01/21/2024)   No facility-administered encounter medications on file as of 01/21/2024.     Review of Systems  Review of Systems  N/a Physical Exam  BP (!) 150/75 (BP Location: Left Arm, Patient Position: Sitting, Cuff Size: Normal)   Pulse 68   Ht 5\' 10"  (1.778 m)   Wt 209 lb (94.8 kg)   SpO2 96%   BMI 29.99 kg/m   Wt Readings from Last 5 Encounters:  01/21/24 209 lb (94.8 kg)  01/08/24 210 lb 12.8 oz (95.6 kg)  12/09/23 221 lb (100.2 kg)  11/26/22 229 lb (103.9 kg)  08/20/22 226 lb (102.5 kg)    BMI Readings from Last 5 Encounters:  01/21/24 29.99 kg/m  01/08/24 30.68 kg/m  12/09/23 32.17 kg/m  11/26/22 33.82 kg/m  08/20/22 33.37 kg/m     Physical Exam General: Well-appearing, sitting in chair Eyes: EOMI, icterus Neck: Supple, no JVP Pulmonary: Clear, normal work of breathing, no crackles Abdomen: Nondistended, bowel sounds present Cardiovascular: Warm, no edema, regular rate and rhythm, MSK: No synovitis, no joint effusion Neuro: Normal gait, no weakness Psych: Normal mood, full affect   Assessment & Plan:   Dyspnea on exertion: Mild, he exercises  regularly without significant limitation.  Chest imaging consistent with ILD, fibrotic HSP versus fibrotic NSIP.  Likely significant contributor to symptoms.  PFTs confirm moderate restriction with preserved DLCO.  [facility]  Cardiac etiologies possible.  Interstitial lung disease: Favor fibrotic HSP versus NSIP.  No real evidence of active inflammation on imaging.  In the past, discussed role and rationale for antifibrotic therapy to help slow progression of this.  I recommended initiation but after discussion of risks and benefits he declined.  Stable symptoms, good exercise tolerance.  Consider repeat PFTs, repeat CT if symptoms worsen and discussion of antifibrotic's again in the future if needed.   Return in about 1 year (around 01/20/2025) for f/u Dr. Judeth Horn.   Karren Burly, MD 01/21/2024

## 2024-01-22 DIAGNOSIS — L821 Other seborrheic keratosis: Secondary | ICD-10-CM | POA: Diagnosis not present

## 2024-01-22 DIAGNOSIS — L578 Other skin changes due to chronic exposure to nonionizing radiation: Secondary | ICD-10-CM | POA: Diagnosis not present

## 2024-01-22 DIAGNOSIS — D229 Melanocytic nevi, unspecified: Secondary | ICD-10-CM | POA: Diagnosis not present

## 2024-01-22 DIAGNOSIS — Z85828 Personal history of other malignant neoplasm of skin: Secondary | ICD-10-CM | POA: Diagnosis not present

## 2024-01-22 DIAGNOSIS — L570 Actinic keratosis: Secondary | ICD-10-CM | POA: Diagnosis not present

## 2024-01-22 DIAGNOSIS — L814 Other melanin hyperpigmentation: Secondary | ICD-10-CM | POA: Diagnosis not present

## 2024-02-05 ENCOUNTER — Ambulatory Visit (INDEPENDENT_AMBULATORY_CARE_PROVIDER_SITE_OTHER): Admitting: *Deleted

## 2024-02-05 DIAGNOSIS — E538 Deficiency of other specified B group vitamins: Secondary | ICD-10-CM

## 2024-02-05 MED ORDER — CYANOCOBALAMIN 1000 MCG/ML IJ SOLN
1000.0000 ug | Freq: Once | INTRAMUSCULAR | Status: AC
  Administered 2024-02-05: 1000 ug via INTRAMUSCULAR

## 2024-02-05 NOTE — Progress Notes (Signed)
 Patient presents for B12 injection today. Patient received her B12 injection in Left Deltoid. Patient tolerated injection well.  Documentation entered in [facility] in [employer].

## 2024-02-16 ENCOUNTER — Ambulatory Visit: Admitting: Family Medicine

## 2024-02-17 ENCOUNTER — Other Ambulatory Visit: Payer: Self-pay | Admitting: Physical Medicine and Rehabilitation

## 2024-02-24 ENCOUNTER — Emergency Department (HOSPITAL_BASED_OUTPATIENT_CLINIC_OR_DEPARTMENT_OTHER)
Admission: EM | Admit: 2024-02-24 | Discharge: 2024-02-24 | Discharge status: Against Medical Advice | Attending: Emergency Medicine | Admitting: Emergency Medicine

## 2024-02-24 ENCOUNTER — Other Ambulatory Visit: Payer: Self-pay

## 2024-02-24 ENCOUNTER — Encounter (HOSPITAL_BASED_OUTPATIENT_CLINIC_OR_DEPARTMENT_OTHER): Payer: Self-pay | Admitting: Emergency Medicine

## 2024-02-24 DIAGNOSIS — Z5321 Procedure and treatment not carried out due to patient leaving prior to being seen by health care provider: Secondary | ICD-10-CM | POA: Insufficient documentation

## 2024-02-24 DIAGNOSIS — W57XXXA Bitten or stung by nonvenomous insect and other nonvenomous arthropods, initial encounter: Secondary | ICD-10-CM | POA: Diagnosis not present

## 2024-02-24 DIAGNOSIS — S30860A Insect bite (nonvenomous) of lower back and pelvis, initial encounter: Secondary | ICD-10-CM | POA: Insufficient documentation

## 2024-02-24 NOTE — ED Triage Notes (Signed)
 Tick on buttock, removed Wednesday. Painful and red wants eval

## 2024-02-26 ENCOUNTER — Encounter (HOSPITAL_COMMUNITY): Payer: Self-pay

## 2024-03-04 ENCOUNTER — Ambulatory Visit

## 2024-04-08 ENCOUNTER — Ambulatory Visit

## 2024-04-08 DIAGNOSIS — E538 Deficiency of other specified B group vitamins: Secondary | ICD-10-CM | POA: Diagnosis not present

## 2024-04-08 MED ORDER — CYANOCOBALAMIN 1000 MCG/ML IJ SOLN: 1000.0000 ug | Freq: Once | INTRAMUSCULAR | Status: AC

## 2024-04-08 MED ORDER — CYANOCOBALAMIN 1000 MCG/ML IJ SOLN
1000.0000 ug | Freq: Once | INTRAMUSCULAR | Status: AC
Start: 2024-04-08 — End: 2024-04-08
  Administered 2024-04-08: 1000 ug via INTRAMUSCULAR

## 2024-04-08 NOTE — Progress Notes (Signed)
 Patient is in office today for a nurse visit for B12 Injection. Patient Injection was given in the  Left deltoid. Patient tolerated injection well.

## 2024-04-09 ENCOUNTER — Other Ambulatory Visit: Payer: Self-pay | Admitting: Family Medicine

## 2024-04-17 ENCOUNTER — Other Ambulatory Visit: Payer: Self-pay | Admitting: Family Medicine

## 2024-05-02 DIAGNOSIS — H1033 Unspecified acute conjunctivitis, bilateral: Secondary | ICD-10-CM | POA: Diagnosis not present

## 2024-05-05 DIAGNOSIS — H1031 Unspecified acute conjunctivitis, right eye: Secondary | ICD-10-CM | POA: Diagnosis not present

## 2024-05-06 ENCOUNTER — Ambulatory Visit

## 2024-05-06 DIAGNOSIS — E538 Deficiency of other specified B group vitamins: Secondary | ICD-10-CM | POA: Diagnosis not present

## 2024-05-06 MED ORDER — CYANOCOBALAMIN 1000 MCG/ML IJ SOLN
1000.0000 ug | Freq: Once | INTRAMUSCULAR | Status: AC
  Administered 2024-05-06: 1000 ug via INTRAMUSCULAR

## 2024-05-06 NOTE — Progress Notes (Signed)
 Patient is in office today for a nurse visit for B12 Injection. Patient Injection was given in the  Left deltoid. Patient tolerated injection well. Next appt scheduled for 06/10/2024.

## 2024-05-26 ENCOUNTER — Other Ambulatory Visit: Payer: Self-pay | Admitting: Family Medicine

## 2024-06-10 ENCOUNTER — Ambulatory Visit

## 2024-06-15 ENCOUNTER — Ambulatory Visit (INDEPENDENT_AMBULATORY_CARE_PROVIDER_SITE_OTHER)

## 2024-06-15 DIAGNOSIS — E538 Deficiency of other specified B group vitamins: Secondary | ICD-10-CM

## 2024-06-15 MED ORDER — CYANOCOBALAMIN 1000 MCG/ML IJ SOLN
1000.0000 ug | Freq: Once | INTRAMUSCULAR | Status: AC
  Administered 2024-06-15: 1000 ug via INTRAMUSCULAR

## 2024-06-15 NOTE — Progress Notes (Signed)
 Patient is in office today for a nurse visit for B12 Injection. Patient Injection was given in the  Right deltoid. Patient tolerated injection well.

## 2024-06-19 ENCOUNTER — Other Ambulatory Visit: Payer: Self-pay | Admitting: Physical Medicine and Rehabilitation

## 2024-07-08 ENCOUNTER — Ambulatory Visit (INDEPENDENT_AMBULATORY_CARE_PROVIDER_SITE_OTHER)

## 2024-07-08 DIAGNOSIS — E538 Deficiency of other specified B group vitamins: Secondary | ICD-10-CM

## 2024-07-08 MED ORDER — CYANOCOBALAMIN 1000 MCG/ML IJ SOLN
1000.0000 ug | Freq: Once | INTRAMUSCULAR | Status: AC
  Administered 2024-07-08: 1000 ug via INTRAMUSCULAR

## 2024-07-08 NOTE — Progress Notes (Signed)
 Patient is in office today for a nurse visit for B12 Injection. Patient Injection was given in the  Left deltoid. Patient tolerated injection well.

## 2024-07-09 ENCOUNTER — Ambulatory Visit: Admitting: Family Medicine

## 2024-07-27 ENCOUNTER — Ambulatory Visit: Payer: Self-pay | Admitting: Family Medicine

## 2024-07-27 ENCOUNTER — Ambulatory Visit: Admitting: Family Medicine

## 2024-07-27 VITALS — BP 118/62 | HR 75 | Temp 97.2°F | Ht 70.0 in | Wt 193.0 lb

## 2024-07-27 DIAGNOSIS — Z131 Encounter for screening for diabetes mellitus: Secondary | ICD-10-CM

## 2024-07-27 DIAGNOSIS — I10 Essential (primary) hypertension: Secondary | ICD-10-CM | POA: Diagnosis not present

## 2024-07-27 DIAGNOSIS — E538 Deficiency of other specified B group vitamins: Secondary | ICD-10-CM | POA: Diagnosis not present

## 2024-07-27 DIAGNOSIS — E785 Hyperlipidemia, unspecified: Secondary | ICD-10-CM

## 2024-07-27 DIAGNOSIS — Z23 Encounter for immunization: Secondary | ICD-10-CM

## 2024-07-27 DIAGNOSIS — R739 Hyperglycemia, unspecified: Secondary | ICD-10-CM

## 2024-07-27 LAB — CBC WITH DIFFERENTIAL/PLATELET
Basophils Absolute: 0.1 K/uL (ref 0.0–0.1)
Basophils Relative: 0.7 % (ref 0.0–3.0)
Eosinophils Absolute: 0.2 K/uL (ref 0.0–0.7)
Eosinophils Relative: 2.5 % (ref 0.0–5.0)
HCT: 41 % (ref 39.0–52.0)
Hemoglobin: 14.3 g/dL (ref 13.0–17.0)
Lymphocytes Relative: 23 % (ref 12.0–46.0)
Lymphs Abs: 1.7 K/uL (ref 0.7–4.0)
MCHC: 34.8 g/dL (ref 30.0–36.0)
MCV: 87.3 fl (ref 78.0–100.0)
Monocytes Absolute: 0.7 K/uL (ref 0.1–1.0)
Monocytes Relative: 8.9 % (ref 3.0–12.0)
Neutro Abs: 4.9 K/uL (ref 1.4–7.7)
Neutrophils Relative %: 64.9 % (ref 43.0–77.0)
Platelets: 264 K/uL (ref 150.0–400.0)
RBC: 4.69 Mil/uL (ref 4.22–5.81)
RDW: 13.3 % (ref 11.5–15.5)
WBC: 7.5 K/uL (ref 4.0–10.5)

## 2024-07-27 LAB — COMPREHENSIVE METABOLIC PANEL WITH GFR
ALT: 17 U/L (ref 0–53)
AST: 18 U/L (ref 0–37)
Albumin: 4.2 g/dL (ref 3.5–5.2)
Alkaline Phosphatase: 48 U/L (ref 39–117)
BUN: 25 mg/dL — ABNORMAL HIGH (ref 6–23)
CO2: 26 meq/L (ref 19–32)
Calcium: 9.8 mg/dL (ref 8.4–10.5)
Chloride: 100 meq/L (ref 96–112)
Creatinine, Ser: 0.88 mg/dL (ref 0.40–1.50)
GFR: 85.04 mL/min (ref >60.00)
Glucose, Bld: 91 mg/dL (ref 70–99)
Potassium: 3.9 meq/L (ref 3.5–5.1)
Sodium: 136 meq/L (ref 135–145)
Total Bilirubin: 0.8 mg/dL (ref 0.2–1.2)
Total Protein: 7.4 g/dL (ref 6.0–8.3)

## 2024-07-27 LAB — HEMOGLOBIN A1C: Hgb A1c MFr Bld: 5.4 % (ref 4.6–6.5)

## 2024-07-27 LAB — VITAMIN B12: Vitamin B-12: >1500 pg/mL — ABNORMAL HIGH (ref 211–911)

## 2024-07-27 LAB — TSH: TSH: 1.57 u[IU]/mL (ref 0.35–5.50)

## 2024-07-27 MED ORDER — CYANOCOBALAMIN 1000 MCG/ML IJ SOLN
1000.0000 ug | Freq: Once | INTRAMUSCULAR | Status: AC
  Administered 2024-07-27: 1000 ug via INTRAMUSCULAR

## 2024-07-27 MED ORDER — SPIRONOLACTONE 25 MG PO TABS: ORAL_TABLET | ORAL | Status: AC

## 2024-07-27 NOTE — Patient Instructions (Addendum)
 Health Maintenance Due  Topic Date Due   Colonoscopy  09/05/2024   GI contact Please call to schedule visit and/or procedure IF you do not hear within a week Address: [address] [place], [place], [place] [address] Phone: [phone]   Please stop by lab before you go If you have mychart- we will send your results within 3 business days of us  receiving them.  If you do not have mychart- we will call you about results within 5 business days of us  receiving them.  *please also note that you will see labs on mychart as soon as they post. I will later go in and write notes on them- will say notes from Dr. Katrinka   No changes today unless labs lead us  to make changes  Recommended follow up: Return for next already scheduled visit or sooner if needed.

## 2024-07-27 NOTE — Progress Notes (Signed)
 Phone [phone] In person visit   Subjective:   Larry Williamson is a 73 y.o. year old very pleasant male patient who presents for/with See problem oriented charting Chief Complaint  Patient presents with   Tremors    Bilateral hand tremors; would like full lab panel done; not fasting;      Past Medical History-  Patient Active Problem List   Diagnosis Date Noted   Lower GI bleed 01/30/2021    Priority: High   AAA (abdominal aortic aneurysm) 01/18/2016    Priority: High   Myalgia due to statin 07/06/2020    Priority: Medium    B12 deficiency 12/03/2019    Priority: Medium    Hyperglycemia 10/01/2016    Priority: Medium    Meralgia paresthetica 12/28/2014    Priority: Medium    Hyperlipidemia 05/15/2007    Priority: Medium    Essential hypertension 05/15/2007    Priority: Medium    Dupuytren's contracture of right hand 10/28/2019    Priority: Low   History of skin cancer 12/28/2014    Priority: Low   Former smoker 12/28/2014    Priority: Low   Abnormal liver enzymes 12/29/2010    Priority: Low   Allergic rhinitis 06/17/2008    Priority: Low   ERECTILE DYSFUNCTION 12/07/2007    Priority: Low   History of colonic polyps 12/07/2007    Priority: Low   Umbilical hernia 05/28/2007    Priority: Low   GERD 05/15/2007    Priority: Low   Dyspnea on exertion 05/22/2022    Medications- reviewed and updated Current Outpatient Medications  Medication Sig Dispense Refill   amLODipine  (NORVASC ) 10 MG tablet Take 10 mg by mouth daily. Through [facility] cardiology     aspirin  81 MG chewable tablet Chew by mouth.     cetirizine (ZYRTEC) 10 MG tablet Take 10 mg by mouth daily.     Cyanocobalamin  2000 MCG/ML SOLN Inject 1,000 mcg as directed every 30 (thirty) days. Patient comes get his b12 injection every 3rd Thursday in the month.     furosemide (LASIX) 20 MG tablet Take by mouth.     gabapentin  (NEURONTIN ) 300 MG capsule TAKE 1 CAPSULE BY MOUTH TWICE A DAY 120 capsule 1    losartan (COZAAR) 100 MG tablet Take 1 tablet by mouth daily.     MULTIPLE VITAMIN PO Take 1 tablet by mouth daily.     Omega-3 Fatty Acids (FISH OIL) 1000 MG CAPS Take 2 capsules by mouth daily.     omeprazole  (PRILOSEC) 40 MG capsule TAKE 1 CAPSULE BY MOUTH EVERY DAY 90 capsule 1   rosuvastatin  (CRESTOR ) 5 MG tablet TAKE 1 TABLET BY MOUTH TWICE WEEKLY 26 tablet 3   sildenafil  (VIAGRA ) 100 MG tablet Take 1 tablet (100 mg total) by mouth daily as needed for erectile dysfunction. 10 tablet 11   spironolactone (ALDACTONE) 25 MG tablet [facility]     Current Facility-Administered Medications  Medication Dose Route Frequency Provider Last Rate Last Admin   cyanocobalamin  (VITAMIN B12) injection 1,000 mcg  1,000 mcg Intramuscular Once Katrinka Garnette KIDD, MD         Objective:  BP 118/62 (BP Location: Left Arm, Patient Position: Sitting, Cuff Size: Normal)   Pulse 75   Temp (!) 97.2 F (36.2 C) (Temporal)   Ht 5' 10 (1.778 m)   Wt 193 lb (87.5 kg)   SpO2 97%   BMI 27.69 kg/m  Gen: NAD, resting comfortably CV: RRR no murmurs rubs or  gallops Lungs: CTAB no crackles, wheeze, rhonchi Ext: no edema Skin: warm, dry     Assessment and Plan   #[facility] - [facility]  # Bilateral hand tremors S: about a month ago- 4-5 days a week at [facility] and reports burning 1000 calories with workout. Wasn't eating- felt like could be low on sugar. As soon as he ate symptoms began to abate. Felt like having a weedeater in his hands level of shakiness. He felt hungry and anxious at well  He made an adjustment at gym- cut down on elevated treadmill and doing more treadmill and down to 650 calorie burn and feels better . He's been doing some peanut butter crackers and that helps as well A/P: sounds like tremors/shakiness was low blood sugar related and he has adjusted well- continue to monitor for recurrence and follow up if happens   #Recent illness- 3 weeks ago on a  Friday he felt fine got up to pee and felt very chilled- raced to get back in bed and next morning woke up and bed soaking wet. Felt pretty good but by that night had a similar sensation and woke up soaked again. Had traveled and felt bad all week. Didn't have thermometer but could have been febrile. Did have ongoing fatigue, chills, body aches. Never tested for flu or COVID- wife never got sick. Feels much better at this point.   #Tick bite - about 10 bites this year -one most recently on his back- had hard time getting head out- no obvious changes to skin at this point- examined today  #hypertension does have diastolic dysfunction grade II S: medication: amlodipine  10 mg, losartan 100 mg,Spironolactone 12.5 mg, Lasix 20 mg- but not needing much lately  BP Readings from Last 3 Encounters:  07/27/24 118/62  02/24/24 (!) 155/72  01/21/24 (!) 150/75  A/P: blood pressure well controlled and improved from previous- continue current medications   #hyperlipidemia S: Medication:Prefers to take aspirin  81 mg despite prior GI bleed but was polypectomy related, rosuvastatin  5 mg twice weekly, fish oil -atorvastatin  with myalgias  Lab Results  Component Value Date   CHOL 185 01/08/2024   HDL 66.20 01/08/2024   LDLCALC 98 01/08/2024   LDLDIRECT 109.0 05/22/2022   TRIG 108.0 01/08/2024   CHOLHDL 3 01/08/2024  A/P: lipids not perfect but tolerable dose and on higher doses of statin in past did not do well so continue current medications   # GERD S:Medication: Omeprazole  40 mg A/P: stable- continue current medicines    # B12 deficiency S: Current treatment/medication (oral vs. IM): B12 injections monthly (prefers over retrying oral)- just received today A/P: wants to check B12 level though discussed with shot today likely high   # Chronic back pain-working with Dr. Eldonna with [facility]-transforaminal epidural steroid injections helpful in the past- doing ok since last  set of injections  # Erectile dysfunction-tadalafil  10 mg helpful  but prefers Viagra - was $400 but gave goodrx coupon  #umbilical hernia- [facility] but [facility] to [facility] if needed   Recommended follow up: Return for next already scheduled visit or sooner if needed. Future Appointments  Date Time Provider Department Center  09/09/2024  9:00 AM [facility] CLINICAL SUPPORT [facility]  10/07/2024  9:00 AM [facility] CLINICAL SUPPORT [facility]  12/16/2024  8:00 AM [facility] ANNUAL WELLNESS VISIT 1 [facility] [place]  01/10/2025  8:00 AM Katrinka, Garnette KIDD,  MD [facility]    Lab/Order associations: NOT fasting   ICD-10-CM   1. Essential hypertension  I10 TSH    Comprehensive metabolic panel with GFR    CBC with Differential/Platelet    2. Hyperlipidemia, unspecified hyperlipidemia type  E78.5 TSH    Comprehensive metabolic panel with GFR    CBC with Differential/Platelet    3. Vitamin B 12 deficiency  E53.8 cyanocobalamin  (VITAMIN B12) injection 1,000 mcg    4. B12 deficiency  E53.8 Vitamin B12    5. Hyperglycemia  R73.9 Hemoglobin A1c    6. Screening for diabetes mellitus  Z13.1 Hemoglobin A1c    7. Immunization due  Z23 Flu vaccine HIGH DOSE PF(Fluzone Trivalent)      Meds ordered this encounter  Medications   cyanocobalamin  (VITAMIN B12) injection 1,000 mcg   spironolactone (ALDACTONE) 25 MG tablet    Sig: Through [facility] cardiology    Return precautions advised.  Garnette Lukes, MD

## 2024-07-27 NOTE — Addendum Note (Signed)
 Addended by: KATRINKA GARNETTE KIDD on: 07/27/2024 02:05 PM   Modules accepted: Level of Service

## 2024-08-05 ENCOUNTER — Ambulatory Visit

## 2024-08-05 ENCOUNTER — Encounter: Payer: Self-pay | Admitting: Gastroenterology

## 2024-08-17 DIAGNOSIS — L570 Actinic keratosis: Secondary | ICD-10-CM | POA: Diagnosis not present

## 2024-08-17 DIAGNOSIS — L821 Other seborrheic keratosis: Secondary | ICD-10-CM | POA: Diagnosis not present

## 2024-08-17 DIAGNOSIS — Z85828 Personal history of other malignant neoplasm of skin: Secondary | ICD-10-CM | POA: Diagnosis not present

## 2024-08-17 DIAGNOSIS — D229 Melanocytic nevi, unspecified: Secondary | ICD-10-CM | POA: Diagnosis not present

## 2024-08-17 DIAGNOSIS — L814 Other melanin hyperpigmentation: Secondary | ICD-10-CM | POA: Diagnosis not present

## 2024-08-17 DIAGNOSIS — L578 Other skin changes due to chronic exposure to nonionizing radiation: Secondary | ICD-10-CM | POA: Diagnosis not present

## 2024-08-23 ENCOUNTER — Encounter: Payer: Self-pay | Admitting: Radiology

## 2024-08-26 ENCOUNTER — Ambulatory Visit (AMBULATORY_SURGERY_CENTER)

## 2024-08-26 VITALS — Ht 70.0 in | Wt 195.8 lb

## 2024-08-26 DIAGNOSIS — Z860100 Personal history of colon polyps, unspecified: Secondary | ICD-10-CM

## 2024-08-26 MED ORDER — NA SULFATE-K SULFATE-MG SULF 17.5-3.13-1.6 GM/177ML PO SOLN: 1.0000 | Freq: Once | ORAL | 0 refills | Status: AC

## 2024-08-26 NOTE — Progress Notes (Signed)

## 2024-09-06 ENCOUNTER — Encounter: Payer: Self-pay | Admitting: Gastroenterology

## 2024-09-07 ENCOUNTER — Ambulatory Visit

## 2024-09-07 DIAGNOSIS — E538 Deficiency of other specified B group vitamins: Secondary | ICD-10-CM

## 2024-09-07 MED ORDER — CYANOCOBALAMIN 1000 MCG/ML IJ SOLN
1000.0000 ug | Freq: Once | INTRAMUSCULAR | Status: AC
  Administered 2024-09-07: 1000 ug via INTRAMUSCULAR

## 2024-09-07 NOTE — Progress Notes (Signed)
 Patient is in office today for a nurse visit for B12 Injection. Patient Injection was given in the  Left deltoid. Patient tolerated injection well.

## 2024-09-09 ENCOUNTER — Ambulatory Visit (AMBULATORY_SURGERY_CENTER): Admitting: Gastroenterology

## 2024-09-09 ENCOUNTER — Ambulatory Visit

## 2024-09-09 ENCOUNTER — Encounter: Payer: Self-pay | Admitting: Gastroenterology

## 2024-09-09 VITALS — BP 144/75 | HR 68 | Temp 97.7°F | Resp 12 | Ht 70.0 in | Wt 195.8 lb

## 2024-09-09 DIAGNOSIS — Z1211 Encounter for screening for malignant neoplasm of colon: Secondary | ICD-10-CM

## 2024-09-09 DIAGNOSIS — K5730 Diverticulosis of large intestine without perforation or abscess without bleeding: Secondary | ICD-10-CM | POA: Diagnosis not present

## 2024-09-09 DIAGNOSIS — Z860100 Personal history of colon polyps, unspecified: Secondary | ICD-10-CM

## 2024-09-09 DIAGNOSIS — K644 Residual hemorrhoidal skin tags: Secondary | ICD-10-CM | POA: Diagnosis not present

## 2024-09-09 DIAGNOSIS — K640 First degree hemorrhoids: Secondary | ICD-10-CM

## 2024-09-09 DIAGNOSIS — Z860101 Personal history of adenomatous and serrated colon polyps: Secondary | ICD-10-CM

## 2024-09-09 MED ORDER — SODIUM CHLORIDE 0.9 % IV SOLN: 500.0000 mL | Freq: Once | INTRAVENOUS | Status: DC

## 2024-09-09 NOTE — Progress Notes (Signed)
 [facility] History and Physical   Primary Care Physician:  Katrinka Garnette KIDD, MD   Reason for Procedure:   History of colon polyps  Plan:    Colonoscopy    HPI: Larry Williamson is a 73 y.o. male undergoing surveillance colonoscopy.  In April 2022 a colonoscopy was notable for a 15 mm cecal polyp, a 25 mm ascending colon polyp and 6 other polyps in the transverse and ascending colon.  He experienced a post-polypectomy bleed, likely from the ascending colon polypectomy site. He underwent a surveillance colonoscopy in Nov 2022 and 4 additional polyps were found in the transverse and ascending colon.   The patient was provided an opportunity to ask questions and all were answered. The patient agreed with the plan   Past Medical History:  Diagnosis Date   Adenomatous colon polyp 06/2006   ALLERGIC RHINITIS    Allergy    seasonal allergies   Anemia    hx of   ANEMIA DUE TO DIETARY IRON DEFICIENCY 05/28/2007   Due to giving regularly giving blood. Resolved with cutting in half.      COMPRESSION FRACTURE, THORACIC VERTEBRA 03/09/2008   2009, no chronic pain    Diverticulitis of colon    DIVERTICULOSIS, COLON 05/28/2007   Qualifier: Diagnosis of  By: Mavis MD, Norleen BRAVO    GERD (gastroesophageal reflux disease)    on meds   Hyperlipidemia    on meds   Hypertension    on meds   Melanoma (HCC) 02/23/2014   MM in situe, early, low mid back, exc   Squamous cell carcinoma in situ 11/10/2018   Top of right shoulder, CX3, 5FU   Squamous cell carcinoma of skin    SCC IN SITU TOP OF RIGHT SHOULDER TX CX3 5FU   Ulcer 1992   gastric    Past Surgical History:  Procedure Laterality Date   COLONOSCOPY  01/2017   hx polyps/tics/hems   COLONOSCOPY  2019   [facility]-TICS/int hems/3 yr recall-   COLONOSCOPY W/ POLYPECTOMY  01/30/2021   Dr.Stark   COLONOSCOPY WITH PROPOFOL  N/A 01/31/2021   Procedure: COLONOSCOPY WITH PROPOFOL ;  Surgeon: Aneita Gwendlyn DASEN, MD;  Location: [facility]  ENDOSCOPY;  Service: Endoscopy;  Laterality: N/A;   FOOT SURGERY Right    pain scraper several inches into foot   HEMOSTASIS CLIP PLACEMENT  01/31/2021   Procedure: HEMOSTASIS CLIP PLACEMENT;  Surgeon: Aneita Gwendlyn DASEN, MD;  Location: [facility] ENDOSCOPY;  Service: Endoscopy;;   POLYPECTOMY  2018   piecemeal polyps TAx 2   WISDOM TOOTH EXTRACTION      Prior to Admission medications   Medication Sig Start Date End Date Taking? Authorizing Provider  losartan (COZAAR) 100 MG tablet Take 1 tablet by mouth daily. 12/23/23  Yes [provider]  MULTIPLE VITAMIN PO Take 1 tablet by mouth daily.   Yes [provider]  sildenafil  (VIAGRA ) 100 MG tablet Take 1 tablet (100 mg total) by mouth daily as needed for erectile dysfunction. 01/08/24  Yes Katrinka Garnette KIDD, MD  amLODipine  (NORVASC ) 10 MG tablet Take 10 mg by mouth daily. Through [facility] cardiology    [provider]  aspirin  81 MG chewable tablet Chew by mouth. 05/20/23   [provider]  cetirizine (ZYRTEC) 10 MG tablet Take 10 mg by mouth daily. Patient taking differently: Take 10 mg by mouth daily as needed for allergies.    [provider]  Cyanocobalamin  2000 MCG/ML SOLN Inject 1,000 mcg as directed every 30 (thirty)  days. Patient comes get his b12 injection every 3rd Thursday in the month.    [provider]  furosemide (LASIX) 20 MG tablet Take by mouth. Patient not taking: Reported on 08/26/2024 09/24/23   [provider]  gabapentin  (NEURONTIN ) 300 MG capsule TAKE 1 CAPSULE BY MOUTH TWICE A DAY 06/22/24   Williams, Megan E, NP  Omega-3 Fatty Acids (FISH OIL) 1000 MG CAPS Take 2 capsules by mouth daily.    [provider]  omeprazole  (PRILOSEC) 40 MG capsule TAKE 1 CAPSULE BY MOUTH EVERY DAY 05/26/24   Katrinka Garnette KIDD, MD  rosuvastatin  (CRESTOR ) 5 MG tablet TAKE 1 TABLET BY MOUTH TWICE WEEKLY 04/09/24   Katrinka Garnette KIDD, MD  spironolactone (ALDACTONE) 25 MG tablet Through [facility]  cardiology Patient not taking: Reported on 08/26/2024 07/27/24   Katrinka Garnette KIDD, MD    Current Outpatient Medications  Medication Sig Dispense Refill   losartan (COZAAR) 100 MG tablet Take 1 tablet by mouth daily.     MULTIPLE VITAMIN PO Take 1 tablet by mouth daily.     sildenafil  (VIAGRA ) 100 MG tablet Take 1 tablet (100 mg total) by mouth daily as needed for erectile dysfunction. 10 tablet 11   amLODipine  (NORVASC ) 10 MG tablet Take 10 mg by mouth daily. Through [facility] cardiology     aspirin  81 MG chewable tablet Chew by mouth.     cetirizine (ZYRTEC) 10 MG tablet Take 10 mg by mouth daily. (Patient taking differently: Take 10 mg by mouth daily as needed for allergies.)     Cyanocobalamin  2000 MCG/ML SOLN Inject 1,000 mcg as directed every 30 (thirty) days. Patient comes get his b12 injection every 3rd Thursday in the month.     furosemide (LASIX) 20 MG tablet Take by mouth. (Patient not taking: Reported on 08/26/2024)     gabapentin  (NEURONTIN ) 300 MG capsule TAKE 1 CAPSULE BY MOUTH TWICE A DAY 120 capsule 1   Omega-3 Fatty Acids (FISH OIL) 1000 MG CAPS Take 2 capsules by mouth daily.     omeprazole  (PRILOSEC) 40 MG capsule TAKE 1 CAPSULE BY MOUTH EVERY DAY 90 capsule 1   rosuvastatin  (CRESTOR ) 5 MG tablet TAKE 1 TABLET BY MOUTH TWICE WEEKLY 26 tablet 3   spironolactone (ALDACTONE) 25 MG tablet Through [facility] (Patient not taking: Reported on 08/26/2024)     Current Facility-Administered Medications  Medication Dose Route Frequency Provider Last Rate Last Admin   0.9 %  sodium chloride  infusion  500 mL Intravenous Once Flynn Gwyn E, MD       cyanocobalamin  (VITAMIN B12) injection 1,000 mcg  1,000 mcg Intramuscular Once Katrinka Garnette KIDD, MD        Allergies as of 09/09/2024   (No Active Allergies)    Family History  Problem Relation Age of Onset   Cancer Father        lung, former smoker, brown lung in [place]   Dementia Mother    Hemochromatosis Brother    Colon cancer  Neg Hx    Esophageal cancer Neg Hx    Rectal cancer Neg Hx    Stomach cancer Neg Hx    Colon polyps Neg Hx     Social History   Socioeconomic History   Marital status: Married    Spouse name: Not on file   Number of children: Not on file   Years of education: Not on file   Highest education level: GED or equivalent  Occupational History   Not on file  Tobacco Use   Smoking status: Former    Current packs/day: 0.00    Average packs/day: 1 pack/day for 15.0 years (15.0 ttl pk-yrs)    Types: Cigarettes    Start date: 10/22/1963    Quit date: 10/21/1978    Years since quitting: 45.9   Smokeless tobacco: Never  Vaping Use   Vaping status: Never Used  Substance and Sexual Activity   Alcohol use: Yes    Alcohol/week: 14.0 standard drinks of alcohol    Types: 14 Standard drinks or equivalent per week    Comment: 2 per day   Drug use: No   Sexual activity: Yes  Other Topics Concern   Not on file  Social History Narrative   Married - wife Gabriella.  5 children (2 by first wife, 3 stepkids), 11 granddaughters + 2 greatgrandsons.       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)      Hobbies: race cars, Risk Manager signed on 03/15/10   Social Drivers of [employer]: Low Risk  (07/27/2024)   Overall Financial Resource Strain (CARDIA)    Difficulty of Paying Living Expenses: Not hard at all  Food Insecurity: Unknown (07/27/2024)   Hunger Vital Sign    Worried About Running Out of Food in the Last Year: Never true    Ran Out of Food in the Last Year: Not on file  Transportation Needs: No Transportation Needs (07/27/2024)   PRAPARE - Administrator, Civil Service (Medical): No    Lack of Transportation (Non-Medical): No  Physical Activity: Sufficiently Active (07/27/2024)   Exercise Vital Sign    Days of Exercise per Week: 6 days    Minutes of Exercise per  Session: 90 min  Stress: No Stress Concern Present (07/27/2024)   [employer] of Occupational Health - Occupational Stress Questionnaire    Feeling of Stress: Not at all  Social Connections: Moderately Integrated (07/27/2024)   Social Connection and Isolation Panel    Frequency of Communication with Friends and Family: More than three times a week    Frequency of Social Gatherings with Friends and Family: More than three times a week    Attends Religious Services: 1 to 4 times per year    Active Member of [employer] or Organizations: No    Attends Banker Meetings: Not on file    Marital Status: Married  Catering Manager Violence: Not At Risk (12/09/2023)   Humiliation, Afraid, Rape, and Kick questionnaire    Fear of Current or Ex-Partner: No    Emotionally Abused: No    Physically Abused: No    Sexually Abused: No    Review of Systems:  All other review of systems negative except as mentioned in the HPI.  Physical Exam: Vital signs BP (!) 143/73   Pulse 69   Temp 97.7 F (36.5 C)   Ht 5' 10 (1.778 m)   Wt 195 lb 12.8 oz (88.8 kg)   SpO2 99%   BMI 28.09 kg/m   General:   Alert,  Well-developed, well-nourished, pleasant and cooperative in NAD Airway:  Mallampati 2 Lungs:  Clear throughout to auscultation.   Heart:  Regular rate and rhythm; no murmurs, clicks, rubs,  or gallops. Abdomen:  Soft, nontender and nondistended. Normal bowel sounds.   Neuro/Psych:  Normal mood and affect. A and O x  3   Barabara Motz E. Stacia, MD [facility]

## 2024-09-09 NOTE — Patient Instructions (Signed)
 Resume previous diet.                           - Continue present medications.                           - Repeat colonoscopy in 3-4 years for surveillance                            given his history of numerous and large adenomas.                           - Recommend high fiber diet or daily fiber                            supplement to reduce risk of diverticular                            complications.  YOU HAD AN ENDOSCOPIC PROCEDURE TODAY AT THE [facility]:   Refer to the procedure report that was given to you for any specific questions about what was found during the examination.  If the procedure report does not answer your questions, please call your gastroenterologist to clarify.  If you requested that your care partner not be given the details of your procedure findings, then the procedure report has been included in a sealed envelope for you to review at your convenience later.  YOU SHOULD EXPECT: Some feelings of bloating in the abdomen. Passage of more gas than usual.  Walking can help get rid of the air that was put into your GI tract during the procedure and reduce the bloating. If you had a lower endoscopy (such as a colonoscopy or flexible sigmoidoscopy) you may notice spotting of blood in your stool or on the toilet paper. If you underwent a bowel prep for your procedure, you may not have a normal bowel movement for a few days.  Please Note:  You might notice some irritation and congestion in your nose or some drainage.  This is from the oxygen used during your procedure.  There is no need for concern and it should clear up in a day or so.  SYMPTOMS TO REPORT IMMEDIATELY:  Following lower endoscopy (colonoscopy or flexible sigmoidoscopy):  Excessive amounts of blood in the stool  Significant tenderness or worsening of abdominal pains  Swelling of the abdomen that is new, acute  Fever of 100F or higher   For urgent or emergent issues, a gastroenterologist  can be reached at any hour by calling [phone]. Do not use MyChart messaging for urgent concerns.    DIET:  We do recommend a small meal at first, but then you may proceed to your regular diet.  Drink plenty of fluids but you should avoid alcoholic beverages for 24 hours.  ACTIVITY:  You should plan to take it easy for the rest of today and you should NOT DRIVE or use heavy machinery until tomorrow (because of the sedation medicines used during the test).    FOLLOW UP: Our staff will call the number listed on your records the next business day following your procedure.  We will call around 7:15- 8:00 am to check on you and address any questions or concerns that  you may have regarding the information given to you following your procedure. If we do not reach you, we will leave a message.     If any biopsies were taken you will be contacted by phone or by letter within the next 1-3 weeks.  Please call us  at [phone] if you have not heard about the biopsies in 3 weeks.    SIGNATURES/CONFIDENTIALITY: You and/or your care partner have signed paperwork which will be entered into your electronic medical record.  These signatures attest to the fact that that the information above on your After Visit Summary has been reviewed and is understood.  Full responsibility of the confidentiality of this discharge information lies with you and/or your care-partner.

## 2024-09-09 NOTE — Progress Notes (Signed)
 Pt's states no medical or surgical changes since previsit or office visit.

## 2024-09-09 NOTE — Op Note (Signed)
 [facility] Patient Name: Larry Williamson Procedure Date: 09/09/2024 7:20 AM MRN: [id number] Endoscopist: Glendia E. Stacia , MD, [id number] Age: 73 Referring MD:  Date of Birth: 11-Sep-1951 Gender: Male Account #: [id number] Procedure:                Colonoscopy Indications:              High risk colon cancer surveillance: Personal                            history of adenoma (10 mm or greater in size), High                            risk colon cancer surveillance: Personal history of                            multiple (3 or more) adenomas Medicines:                Monitored Anesthesia Care Procedure:                Pre-Anesthesia Assessment:                           - Prior to the procedure, a History and Physical                            was performed, and patient medications and                            allergies were reviewed. The patient's tolerance of                            previous anesthesia was also reviewed. The risks                            and benefits of the procedure and the sedation                            options and risks were discussed with the patient.                            All questions were answered, and informed consent                            was obtained. Prior Anticoagulants: The patient has                            taken no anticoagulant or antiplatelet agents. ASA                            Grade Assessment: II - A patient with mild systemic                            disease. After reviewing the risks and benefits,  the patient was deemed in satisfactory condition to                            undergo the procedure.                           After obtaining informed consent, the colonoscope                            was passed under direct vision. Throughout the                            procedure, the patient's blood pressure, pulse, and                            oxygen saturations were  monitored continuously. The                            CF [id number] #[id number] was introduced through the anus                            and advanced to the the terminal ileum, with                            identification of the appendiceal orifice and IC                            valve. The colonoscopy was performed without                            difficulty. The patient tolerated the procedure                            well. The quality of the bowel preparation was                            adequate. The terminal ileum, ileocecal valve,                            appendiceal orifice, and rectum were photographed.                            The bowel preparation used was SUPREP via split                            dose instruction. Scope In: 8:07:58 AM Scope Out: 8:26:10 AM Scope Withdrawal Time: 0 hours 14 minutes 4 seconds  Total Procedure Duration: 0 hours 18 minutes 12 seconds  Findings:                 Skin tags were found on perianal exam.                           The digital rectal exam was normal. Pertinent  negatives include normal sphincter tone and no                            palpable rectal lesions.                           Many large-mouthed and small-mouthed diverticula                            were found in the sigmoid colon, descending colon,                            transverse colon and ascending colon. There was                            evidence of an impacted diverticulum.                           The exam was otherwise normal throughout the                            examined colon.                           The terminal ileum appeared normal.                           Non-bleeding internal hemorrhoids were found during                            retroflexion. The hemorrhoids were Grade I                            (internal hemorrhoids that do not prolapse).                           No additional abnormalities were found on                             retroflexion. Complications:            No immediate complications. Estimated Blood Loss:     Estimated blood loss: none. Impression:               - Perianal skin tags found on perianal exam.                           - Severe diverticulosis in the sigmoid colon, in                            the descending colon, in the transverse colon and                            in the ascending colon. There was evidence of an  impacted diverticulum.                           - The examined portion of the ileum was normal.                           - Non-bleeding internal hemorrhoids.                           - No specimens collected. Recommendation:           - Patient has a contact number available for                            emergencies. The signs and symptoms of potential                            delayed complications were discussed with the                            patient. Return to normal activities tomorrow.                            Written discharge instructions were provided to the                            patient.                           - Resume previous diet.                           - Continue present medications.                           - Repeat colonoscopy in 3-4 years for surveillance                            given his history of numerous and large adenomas.                           - Recommend high fiber diet or daily fiber                            supplement to reduce risk of diverticular                            complications. Melane Windholz E. Stacia, MD 09/09/2024 8:33:27 AM This report has been signed electronically.

## 2024-09-09 NOTE — Progress Notes (Signed)
 To pacu, VSS. Report to [email or web address]

## 2024-09-10 ENCOUNTER — Telehealth: Payer: Self-pay

## 2024-09-10 NOTE — Telephone Encounter (Signed)
Left HIPAA compliant voicemail. 

## 2024-10-07 ENCOUNTER — Ambulatory Visit

## 2024-10-07 ENCOUNTER — Other Ambulatory Visit: Payer: Self-pay

## 2024-10-07 DIAGNOSIS — E538 Deficiency of other specified B group vitamins: Secondary | ICD-10-CM | POA: Diagnosis not present

## 2024-10-07 MED ORDER — CYANOCOBALAMIN 1000 MCG/ML IJ SOLN
1000.0000 ug | Freq: Once | INTRAMUSCULAR | Status: AC
  Administered 2024-10-07: 09:00:00 1000 ug via INTRAMUSCULAR

## 2024-10-07 NOTE — Progress Notes (Signed)
 Patient is in office today for a nurse visit for B12 Injection. Patient Injection was given in the  Left deltoid. Patient tolerated injection well.

## 2024-10-14 ENCOUNTER — Other Ambulatory Visit: Payer: Self-pay | Admitting: Physical Medicine and Rehabilitation

## 2024-11-11 ENCOUNTER — Ambulatory Visit

## 2024-11-11 DIAGNOSIS — E538 Deficiency of other specified B group vitamins: Secondary | ICD-10-CM | POA: Diagnosis not present

## 2024-11-11 MED ORDER — CYANOCOBALAMIN 1000 MCG/ML IJ SOLN
1000.0000 ug | Freq: Once | INTRAMUSCULAR | Status: AC
  Administered 2024-11-11: 1000 ug via INTRAMUSCULAR

## 2024-11-11 NOTE — Progress Notes (Signed)
 Patient is in office today for a nurse visit for B12 Injection. Patient Injection was given in the  Left deltoid. Patient tolerated injection well. No other questions or concerns were addressed during his visit.

## 2024-11-18 ENCOUNTER — Other Ambulatory Visit: Payer: Self-pay | Admitting: Family Medicine

## 2024-12-16 ENCOUNTER — Ambulatory Visit: Payer: Medicare Other

## 2025-01-10 ENCOUNTER — Encounter: Admitting: Family Medicine
